# Patient Record
Sex: Male | Born: 1955 | Race: White | Hispanic: No | Marital: Married | Smoking: Former smoker
Health system: Southern US, Community
[De-identification: ages and names within clinical notes are randomized; demographics above are authoritative.]

## PROBLEM LIST (undated history)

## (undated) DIAGNOSIS — Z8711 Personal history of peptic ulcer disease: Secondary | ICD-10-CM

## (undated) DIAGNOSIS — I2510 Atherosclerotic heart disease of native coronary artery without angina pectoris: Secondary | ICD-10-CM

## (undated) DIAGNOSIS — N529 Male erectile dysfunction, unspecified: Secondary | ICD-10-CM

## (undated) DIAGNOSIS — Z8719 Personal history of other diseases of the digestive system: Secondary | ICD-10-CM

## (undated) DIAGNOSIS — N182 Chronic kidney disease, stage 2 (mild): Secondary | ICD-10-CM

## (undated) DIAGNOSIS — Z8669 Personal history of other diseases of the nervous system and sense organs: Secondary | ICD-10-CM

## (undated) DIAGNOSIS — K219 Gastro-esophageal reflux disease without esophagitis: Secondary | ICD-10-CM

## (undated) DIAGNOSIS — F32A Depression, unspecified: Secondary | ICD-10-CM

## (undated) DIAGNOSIS — I10 Essential (primary) hypertension: Secondary | ICD-10-CM

## (undated) DIAGNOSIS — M1990 Unspecified osteoarthritis, unspecified site: Secondary | ICD-10-CM

## (undated) DIAGNOSIS — G8928 Other chronic postprocedural pain: Secondary | ICD-10-CM

## (undated) DIAGNOSIS — I209 Angina pectoris, unspecified: Secondary | ICD-10-CM

## (undated) DIAGNOSIS — K589 Irritable bowel syndrome without diarrhea: Secondary | ICD-10-CM

## (undated) DIAGNOSIS — R011 Cardiac murmur, unspecified: Secondary | ICD-10-CM

## (undated) DIAGNOSIS — Q231 Congenital insufficiency of aortic valve: Secondary | ICD-10-CM

## (undated) DIAGNOSIS — C641 Malignant neoplasm of right kidney, except renal pelvis: Secondary | ICD-10-CM

## (undated) DIAGNOSIS — Q2381 Bicuspid aortic valve: Secondary | ICD-10-CM

## (undated) DIAGNOSIS — Z9289 Personal history of other medical treatment: Secondary | ICD-10-CM

## (undated) DIAGNOSIS — F329 Major depressive disorder, single episode, unspecified: Secondary | ICD-10-CM

## (undated) DIAGNOSIS — E785 Hyperlipidemia, unspecified: Secondary | ICD-10-CM

## (undated) DIAGNOSIS — F419 Anxiety disorder, unspecified: Secondary | ICD-10-CM

## (undated) DIAGNOSIS — G43909 Migraine, unspecified, not intractable, without status migrainosus: Secondary | ICD-10-CM

## (undated) HISTORY — DX: Anxiety disorder, unspecified: F41.9

## (undated) HISTORY — PX: CARDIAC VALVE REPLACEMENT: SHX585

## (undated) HISTORY — DX: Depression, unspecified: F32.A

## (undated) HISTORY — PX: TOE SURGERY: SHX1073

## (undated) HISTORY — DX: Personal history of other diseases of the nervous system and sense organs: Z86.69

## (undated) HISTORY — PX: EXCISIONAL HEMORRHOIDECTOMY: SHX1541

## (undated) HISTORY — DX: Male erectile dysfunction, unspecified: N52.9

## (undated) HISTORY — DX: Other chronic postprocedural pain: G89.28

## (undated) HISTORY — PX: ELBOW SURGERY: SHX618

## (undated) HISTORY — PX: TONSILLECTOMY: SUR1361

## (undated) HISTORY — PX: CARDIAC CATHETERIZATION: SHX172

## (undated) HISTORY — DX: Bicuspid aortic valve: Q23.81

## (undated) HISTORY — DX: Congenital insufficiency of aortic valve: Q23.1

## (undated) HISTORY — DX: Irritable bowel syndrome, unspecified: K58.9

## (undated) HISTORY — PX: BUNIONECTOMY: SHX129

## (undated) HISTORY — DX: Malignant neoplasm of right kidney, except renal pelvis: C64.1

## (undated) HISTORY — DX: Cardiac murmur, unspecified: R01.1

## (undated) HISTORY — DX: Major depressive disorder, single episode, unspecified: F32.9

## (undated) HISTORY — DX: Hyperlipidemia, unspecified: E78.5

## (undated) HISTORY — PX: HERNIA REPAIR: SHX51

---
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
## 2000-06-16 ENCOUNTER — Ambulatory Visit (HOSPITAL_BASED_OUTPATIENT_CLINIC_OR_DEPARTMENT_OTHER): Admission: RE | Admit: 2000-06-16 | Discharge: 2000-06-16 | Payer: Self-pay | Admitting: Orthopedic Surgery

## 2001-02-04 ENCOUNTER — Encounter: Admission: RE | Admit: 2001-02-04 | Discharge: 2001-02-04 | Payer: Self-pay | Admitting: Internal Medicine

## 2001-02-04 ENCOUNTER — Encounter: Payer: Self-pay | Admitting: Internal Medicine

## 2001-02-16 DIAGNOSIS — C641 Malignant neoplasm of right kidney, except renal pelvis: Secondary | ICD-10-CM

## 2001-02-16 HISTORY — DX: Malignant neoplasm of right kidney, except renal pelvis: C64.1

## 2001-11-18 ENCOUNTER — Encounter: Admission: RE | Admit: 2001-11-18 | Discharge: 2001-11-18 | Payer: Self-pay | Admitting: Internal Medicine

## 2001-11-18 ENCOUNTER — Encounter: Payer: Self-pay | Admitting: Internal Medicine

## 2002-05-10 ENCOUNTER — Encounter: Payer: Self-pay | Admitting: Internal Medicine

## 2002-05-10 ENCOUNTER — Encounter: Admission: RE | Admit: 2002-05-10 | Discharge: 2002-05-10 | Payer: Self-pay | Admitting: Internal Medicine

## 2002-07-07 ENCOUNTER — Ambulatory Visit (HOSPITAL_COMMUNITY): Admission: RE | Admit: 2002-07-07 | Discharge: 2002-07-07 | Payer: Self-pay | Admitting: Family Medicine

## 2002-07-18 ENCOUNTER — Encounter: Admission: RE | Admit: 2002-07-18 | Discharge: 2002-07-18 | Payer: Self-pay | Admitting: Family Medicine

## 2002-08-16 ENCOUNTER — Encounter: Payer: Self-pay | Admitting: Urology

## 2002-08-24 ENCOUNTER — Encounter (INDEPENDENT_AMBULATORY_CARE_PROVIDER_SITE_OTHER): Payer: Self-pay | Admitting: Specialist

## 2002-08-24 ENCOUNTER — Inpatient Hospital Stay (HOSPITAL_COMMUNITY): Admission: RE | Admit: 2002-08-24 | Discharge: 2002-08-27 | Payer: Self-pay | Admitting: Urology

## 2002-08-24 HISTORY — PX: PARTIAL NEPHRECTOMY: SHX414

## 2003-04-13 ENCOUNTER — Ambulatory Visit (HOSPITAL_COMMUNITY): Admission: RE | Admit: 2003-04-13 | Discharge: 2003-04-13 | Payer: Self-pay | Admitting: General Surgery

## 2003-04-13 HISTORY — PX: ANAL SPHINCTEROTOMY: SHX1140

## 2003-09-27 ENCOUNTER — Ambulatory Visit (HOSPITAL_COMMUNITY): Admission: RE | Admit: 2003-09-27 | Discharge: 2003-09-27 | Payer: Self-pay | Admitting: Cardiology

## 2003-09-27 ENCOUNTER — Encounter (INDEPENDENT_AMBULATORY_CARE_PROVIDER_SITE_OTHER): Payer: Self-pay | Admitting: Cardiology

## 2003-11-14 ENCOUNTER — Ambulatory Visit (HOSPITAL_COMMUNITY): Admission: RE | Admit: 2003-11-14 | Discharge: 2003-11-14 | Payer: Self-pay | Admitting: Urology

## 2003-11-29 ENCOUNTER — Ambulatory Visit (HOSPITAL_COMMUNITY): Admission: RE | Admit: 2003-11-29 | Discharge: 2003-11-30 | Payer: Self-pay | Admitting: Cardiology

## 2004-11-12 ENCOUNTER — Ambulatory Visit (HOSPITAL_COMMUNITY): Admission: RE | Admit: 2004-11-12 | Discharge: 2004-11-12 | Payer: Self-pay | Admitting: Urology

## 2006-02-16 HISTORY — PX: COLONOSCOPY: SHX174

## 2006-10-08 ENCOUNTER — Ambulatory Visit (HOSPITAL_COMMUNITY): Admission: RE | Admit: 2006-10-08 | Discharge: 2006-10-08 | Payer: Self-pay | Admitting: Urology

## 2007-07-29 ENCOUNTER — Ambulatory Visit (HOSPITAL_BASED_OUTPATIENT_CLINIC_OR_DEPARTMENT_OTHER): Admission: RE | Admit: 2007-07-29 | Discharge: 2007-07-29 | Payer: Self-pay | Admitting: Orthopedic Surgery

## 2007-07-29 HISTORY — PX: LATERAL EPICONDYLE RELEASE: SHX1958

## 2010-07-01 NOTE — Op Note (Signed)
NAME:  Max Byrd, Max Byrd                   ACCOUNT NO.:  [id number]   MEDICAL RECORD NO.:  [id number]          PATIENT TYPE:  AMB   LOCATION:  DSC                          FACILITY:  [facility]   PHYSICIAN:  Harvie Junior, M.D.   DATE OF BIRTH:  04-12-1955   DATE OF PROCEDURE:  07/29/2007  DATE OF DISCHARGE:                               OPERATIVE REPORT   PREOPERATIVE DIAGNOSES:  1. Lateral epicondylitis.  2. Posterior interosseous nerve compression.   POSTOPERATIVE DIAGNOSES:  1. Lateral epicondylitis.  2. Posterior interosseous nerve compression.   PRINCIPAL PROCEDURE:  1. Lateral epicondylar release with debridement of tendon stripping      and debridement of the bone.  2. Release of posterior interosseous nerve through the body of the      supinator muscle and the ACRB proximally.   SURGEON:  Harvie Junior, M.D.   ASSISTANT:  Marshia Ly, P.A.   ANESTHESIA:  General.   BRIEF HISTORY:  Mr. Heidinger is a 55 year old male with long history of  having had significant left elbow pain.  We treated conservatively for a  period of time with injection therapy, activity modification.  Because  of continued complaints of pain, he was ultimately taken to the  operating room for lateral epicondylar release and exploration of the  posterior interosseous nerve.   PROCEDURE:  The patient was taken to the operating room.  After adequate  anesthesia obtained with general anesthetic, the patient was placed  supine on the operating table.  The left arm was then prepped and draped  in usual sterile fashion.  Following this, the arm was exsanguinated and  blood pressure tourniquet was inflated to 250 mmHg.  Following this, an  incision was made over the dorsal aspect of the wrist.  Subcutaneous  tissue dissected down to the level of the fascia.  The fascia was then  divided.  The brachioradialis and extensor carpi radialis longus fascia  was divided over the elbow, and once the slit was made, joint  fluid  began coming into the wound as the extensor brevis had been completely  avulsed from this area.   This remaining fibers from the brevis were released from the lateral  epicondyle, and this torn portion of the common extensor origin was  debrided.  Once this was done, we were essentially looking in the elbow  joint, had a brief exploration of the elbow joint.  There was no  evidence of loose bodies or bad synovitis.  The joint was thoroughly  irrigated and suctioned dried.  At this point, a rongeur was taken to  roughen up the lateral epicondyle to a good bed of bleeding bone and  then the muscle brachioradialis and extensor carpi radialis longus were  then re-attached over the lateral epicondyle.  Once this was completed,  the area was closed with 0 Vicryl running.  Attention was then turned  distally between the interval between the mobile wad  and the extensor  digitorum communis.  We dissected down to this interval and found the  posterior interosseous nerve __________ I think,  because the brevis had  been pulled distally.   The brevis proximally had a significant impact on the posterior  interosseous nerve, this was released, and the arcade of Frohse was  released.  The entrance into the supinator was released, and the nerve  was released through the belly of the supinator muscle.  Once this was  completed, the wound was copiously and thoroughly irrigated and  suctioned dry.  The rent between the fascia over the mobile wad  and the  communis was closed with 1-0 Vicryl running.  This was a general layer,  not really strength layer but just to close the fascia.  The skin was  then closed with a combination of 4-0 Vicryl and 3-0 Monocryl  subcuticular stitching.  Benzoin and Steri-Strips were applied.  Sterile  compression dressing was applied.  The patient was taken to the recovery  room and was noted to be in satisfactory condition.   ESTIMATED BLOOD LOSS:  During the  procedure was none.      Harvie Junior, M.D.  Electronically Signed     JLG/MEDQ  D:  07/29/2007  T:  07/30/2007  Job:  [id number]

## 2010-07-04 NOTE — Cardiovascular Report (Signed)
NAME:  Max Byrd, Max Byrd                   ACCOUNT NO.:  [id number]   MEDICAL RECORD NO.:  [id number]          PATIENT TYPE:  OIB   LOCATION:  3735                         FACILITY:  [facility]   PHYSICIAN:  Armanda Magic, M.D.     DATE OF BIRTH:  [birth year]   DATE OF PROCEDURE:  11/29/2003  DATE OF DISCHARGE:                              CARDIAC CATHETERIZATION   PROCEDURE:  Coronary angiography and aortography.   OPERATOR:  Armanda Magic, M.D.   INDICATIONS FOR PROCEDURE:  Chest pain, abnormal Cardiolite.   COMPLICATIONS:  None.   IV ACCESS:  Via right femoral artery 6 French sheath.   This is a 55 year old white male with previously diagnosed aortic valve  sclerosis and bicuspid aortic valve by TEE done earlier this year.  Aortic  valve area 2.4 cm square by telemetry at the time of TEE.  The patient has  been having some intermittent rest chest pain and exertional shortness of  breath, and was noted to have mild inferior septal basal reversible defect  and now presents for cardiac catheterization.   The patient is brought to the cardiac catheterization laboratory in a  fasting, nonsedated state.  Informed consent was obtained.  The patient was  connected to continuous heart rate and pulse oximetry monitoring and  intermittent blood pressure monitoring.  The right groin was prepped and  draped in a sterile fashion.  1% Xylocaine was used for local anesthesia.  Using a modified Seldinger technique, a 6 French sheath was placed in the  right femoral artery.  Under fluoroscopic guidance, a 6 [place] JL4 catheter  was placed in the left coronary artery.  Multiple cine films were taken in  the 30 degree RAO and 40 degree LAO views.  This catheter was then exchanged  out over a guide-wire for 6 [place] JR4 catheter which was placed under  fluoroscopic guidance in the right coronary artery.  Multiple attempts at  engaging the right coronary ostium were unsuccessful and the catheter was  exchanged  out for a 6 [place] no-tail catheter, again, which was unsuccessful  in cannulating the right coronary ostium.  The catheter was exchanged out  for 6 French angled pigtail catheter which was placed under fluoroscopic  guidance into the area of the aortic valve.  The aortic valve was probed but  the catheter and the guide-wire were unsuccessful in crossing the aortic  valve.  The catheter was then pulled back into the proximal aortic root and  aortography was performed using a total of 20 mL of contrast at 15 mL per  second.  The catheter was then removed over a guide-wire.  At the end of the  procedure, all catheters and sheaths were removed.  Manual compression was  performed until adequate hemostasis was obtained.  The patient was  transferred back to her room in stable condition.   RESULTS:  Left main coronary artery is widely patent and bifurcates into a  left anterior descending  and left circumflex artery.  The left anterior  descending artery has a proximal 20-30% narrowing and then is widely  patent  throughout its course to the apex.  It gives rise to one diagonal branch  which is widely patent.   The left circumflex has a proximal 20-30% narrowing, as well, and then is  widely patent to the mid portion.  It gives rise to a very high first obtuse  marginal branch which immediately bifurcates into two branches and small and  widely patent.  The ongoing circumflex gives rise to a very large OM2 which  is widely patent throughout its course and bifurcates distally in the  posterior descending artery and posterolateral artery.  The ongoing  circumflex has a 70-80% narrowing before terminating distally.   The right coronary artery ostium could not be cannulated and there was no  evidence of take off of the right coronary artery by aortography, question  whether this is an anomalous take off but I could not locate the right  coronary artery on aortography.   Left ventriculography was  not performed because the catheter or wire was  unable to pass across the aortic valve.  The patient does have a history of  bicuspid aortic valve with no significant aortic valve stenosis by  telemetry.  The valve area was 2.4 cm square at the time of TEE.   ASSESSMENT:  1.  Bicuspid aortic valve by TEE with no significant stenosis at that time.  2.  Chest pain with positive Cardiolite for ischemia in the inferobasal      septum.  3.  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  4.  RCA not visualized, question of anomalous take off.   PLAN:  Review of films with Dr. Katrinka Blazing, probable PCI of the left circumflex  on October 14, will start on aspirin and Plavix.       TT/MEDQ  D:  11/29/2003  T:  11/29/2003  Job:  [id number]   cc:   Ike Bene, M.D.  [address]  [place]  [place] [address]  Fax: [phone]

## 2010-07-04 NOTE — H&P (Signed)
NAME:  Max Byrd, CHECK                             ACCOUNT NO.:  [id number]   MEDICAL RECORD NO.:  [id number]                   PATIENT TYPE:  INP   LOCATION:  0345                                 FACILITY:  [facility]   PHYSICIAN:  Valetta Fuller, M.D.               DATE OF BIRTH:  1955-08-20   DATE OF ADMISSION:  08/24/2002  DATE OF DISCHARGE:  08/27/2002                                HISTORY & PHYSICAL   CHIEF COMPLAINT:  Right renal mass.   HISTORY OF PRESENT ILLNESS:  Mr. Lammert is a 55 year old male.  The patient  was incidentally noted to have a small right renal mass.  This was noted  after the patient had MRI because of some nonspecific leg pain.  The patient  had a CT angiogram which revealed a 2 cm enhancing mass in the mid pole of  the right kidney.  We extensively discussed the situation with the patient  and felt that this was probably a small renal cell carcinoma.  The patient  was actually initially a patient of Claudette Laws, M.D. who does not do  partial nephrectomies and he was kind enough to refer him to our practice.  After much discussion the patient elected to proceed with flank exploration  and partial nephrectomy.  He is completely asymptomatic from this lesion.  The patient now presents for that procedure and hopefully routine  postoperative care status post the procedure.   PAST MEDICAL HISTORY:  Relatively unremarkable.  The patient really has no  significant systemic __________  other than some mild hypertension.   CURRENT MEDICATIONS:  1. Arthrotec.  2. Avapro.  3. Darvocet.   ALLERGIES:  He had no drug allergies.   SOCIAL HISTORY:  He does have approximately 30-40-pack-year smoking history  and is a social drinker.   FAMILY HISTORY:  Noncontributory.   PAST SURGICAL HISTORY:  Notable for some orthopedic procedures.   REVIEW OF SYSTEMS:  Only been notable for some right leg discomfort.   PHYSICAL EXAMINATION:  GENERAL:  He is a well-developed,  well-nourished  male.  VITAL SIGNS:  He was afebrile with a blood pressure 114/80, heart rate 72.  NECK:  No JVD.  CHEST:  Clear to auscultation.  ABDOMEN:  Soft, benign without obvious masses.  GENITOURINARY:  Normal.  EXTREMITIES:  Normal.   DATA:  Basic electrolyte and CBC panels were within normal limits.    ASSESSMENT:  Right renal mass.  The patient will undergo right flank  exploration with probable partial nephrectomy today and hopefully will be  admitted for routine postoperative care.                                               Valetta Fuller, M.D.  DSG/MEDQ  D:  09/21/2002  T:  09/21/2002  Job:  [id number]

## 2010-07-04 NOTE — Discharge Summary (Signed)
   NAME:  Max Byrd, Max Byrd                             ACCOUNT NO.:  [id number]   MEDICAL RECORD NO.:  [id number]                   PATIENT TYPE:  INP   LOCATION:  0345                                 FACILITY:  [facility]   PHYSICIAN:  Valetta Fuller, M.D.               DATE OF BIRTH:  31-Jan-1956   DATE OF ADMISSION:  08/24/2002  DATE OF DISCHARGE:  08/27/2002                                 DISCHARGE SUMMARY   DISCHARGE DIAGNOSES:  Renal cell carcinoma.   PROCEDURE PERFORMED:  Right partial nephrectomy on August 24, 2002.   HOSPITAL COURSE:  [name] is a 55 year old male.  He was noted to have  incidental enhancing 2 cm right renal mass.  The patient agreed with our  recommendations for partial nephrectomy given high likelihood that this  represented a renal cell carcinoma.  On August 24, 2002 the patient underwent  exploration.  The lesion itself was not exophytic and was deep within the  parenchyma.  The initial margins on the tumor were positive and therefore we  took additional tissue which was then negative.  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  Maximum tumor size was 2.2 cm.  Again, lateral and base margins were  negative.  Surgery itself was relatively uneventful.  Estimated blood loss  was approximately 1000 mL.  Postoperatively his hemoglobin was 13.  The  patient did have a Jackson-Pratt drain.  He remained stable throughout his  hospitalization and remained afebrile.  He had good p.o. intake and his JP  drainage decreased and was eventually removed.  The patient was discharged  home on postoperative day three.  At that time he was afebrile, ambulating,  with a general diet.   DISPOSITION:  The patient was discharged home with routine instructions.  He  was given a prescription for Tylox and will be seen in our office in five to  seven days.                                               Valetta Fuller, M.D.    DSG/MEDQ  D:  09/21/2002  T:  09/21/2002  Job:  [id number]

## 2010-07-04 NOTE — Op Note (Signed)
NAME:  Max Byrd, Max Byrd                             ACCOUNT NO.:  [id number]   MEDICAL RECORD NO.:  [id number]                   PATIENT TYPE:  AMB   LOCATION:  DAY                                  FACILITY:  [facility]   PHYSICIAN:  Sharlet Salina T. Hoxworth, M.D.          DATE OF BIRTH:  January 08, 1956   DATE OF PROCEDURE:  04/13/2003  DATE OF DISCHARGE:                                 OPERATIVE REPORT   PREOPERATIVE DIAGNOSES:  Chronic anal fissure.   POSTOPERATIVE DIAGNOSES:  Chronic anal fissure.   PROCEDURE:  Lateral internal anal sphincterotomy.   SURGEON:  Lorne Skeens. Hoxworth, M.D.   ANESTHESIA:  General endotracheal.   BRIEF HISTORY:  Max Byrd is a 55 year old white male who presents with a  several month history of persistent anal pain and bleeding with bowel  movements.  Examination in the office has revealed a typical appearing  posterior midline fissure.  After discussion of the options, we have elected  to proceed with internal anal sphincterotomy in an attempt to get this  healed. The nature of the procedure, indications, risks of bleeding,  infection, nonhealing and degrees of incontinence were discussed and  understood preoperatively.  He is now brought to the operating room for this  procedure.   DESCRIPTION OF PROCEDURE:  The patient was brought to the operating room,  placed in the supine position on the operating table.  General endotracheal  anesthesia was induced.  He received preoperative antibiotics.  He was  carefully positioned in lithotomy position, the perineum sterilely prepped  and draped.  The anus gently dilated and retractor placed.  Examination  revealed a typical appearing posterior midline fissure.  There were some  moderate internal and external hemorrhoids that were not inflamed.  The  internal anal sphincter felt hypertrophied.  The intersphincteric groove was  easily palpable in the left lateral anal canal and a small stab incision was  made here and the   tip of the hemostat inserted into the intersphincteric  groove. The hypertrophied sphincter was then elevated with the hemostat into  the incision and divided with cautery.  The perianal area was infiltrated  with Marcaine with epinephrine.  Hemostasis was obtained with pressure.  A  dry gauze dressing was applied, the patient taken to recovery in  satisfactory condition.                                               Lorne Skeens. Hoxworth, M.D.    Tory Emerald  D:  04/13/2003  T:  04/13/2003  Job:  [id number]

## 2010-07-04 NOTE — Op Note (Signed)
NAME:  Max Byrd, Max Byrd                             ACCOUNT NO.:  [id number]   MEDICAL RECORD NO.:  [id number]                   PATIENT TYPE:  INP   LOCATION:  0345                                 FACILITY:  [facility]   PHYSICIAN:  Valetta Fuller, M.D.               DATE OF BIRTH:  Dec 02, 1955   DATE OF PROCEDURE:  08/24/2002  DATE OF DISCHARGE:                                 OPERATIVE REPORT   PREOPERATIVE DIAGNOSIS:  Right renal mass.   POSTOPERATIVE DIAGNOSIS:  Right renal mass.   PROCEDURE PERFORMED:  Right partial nephrectomy.   SURGEON:  Valetta Fuller, M.D.   ASSISTANT:  Lindaann Slough, M.D.   ANESTHESIA:  General endotracheal.   INDICATIONS FOR PROCEDURE:  [name] is a 55 year old male. He was sent to  one of my partners, Dr. Etta Grandchild, because of an incidentally noted right renal  mass. The patient had been having some nonspecific leg pain. As part of  that, he had a CT angio. On the CT, a 2 cm enhancing mass was noted in the  mid pole of the right kidney. Dr. Etta Grandchild talked to the patient about this and  suggested that he may want to consider a partial nephrectomy. Subsequently,  [name] came to see me. He was told unequivocally that he had what appeared  to be an enhancing solid mass in the kidney and this was renal cell  carcinoma until proven otherwise. He was told that there was approximately a  75% chance that this was cancerous. We talked about the pros and cons of  biopsies but we discouraged that for several reasons as outlined in my  notes. We felt that this lesion was amendable to partial nephrectomy. The  only complicating factor was that it was about 95% within the parenchyma and  there really was no exophytic component unfortunately. We talked about the  pros and cons of partial nephrectomy with the normal contralateral renal  unit. We felt that given the size of the lesion, a partial nephrectomy was  at least a reasonable option. The patient really did not want  a radical  nephrectomy and asked [place] to please do everything we could to save that renal  unit.  He understood the advantages and disadvantages of partial nephrectomy  versus radical nephrectomy. He was told that partial nephrectomy can be more  complicated and is typically associated with more blood loss and does have  the potential for other complications not generally seen with radical  prostatectomy such as urinary leak, etc. Full informed consent was obtained.   TECHNIQUE AND FINDINGS:  The patient was brought to the operating room.  General endotracheal anesthesia was induced. A Foley catheter was placed and  then the patient was carefully placed in the standard flank position. Great  care was taken to pad his extremities and an axillary roll was utilized.  After the  patient was prepped and draped, a standard flank incision was made  off the tip of the twelfth rib. A small distal portion of the rib was taken.  The retroperitoneal space was opened and the kidney identified. The patient  had very little perinephric fat. We were able to establish a plane between  Gerota's fascia and the peritoneum and colon on the right side. The adrenal  gland was identified and we came across the top of the kidney to increase  mobility and allow [place] to bring the kidney down  more into the operative  field. We then turned our attention to the hilum. Two renal arteries were  identified which were encircled with Vesseloops. We then opened Gerota's  fascia on the area of the tumor. In order to identify the tumor, we had to  open Gerota's fascia right over the top of this tumor and there really was  very little if any fat really on the renal capsule. The tumor was able to be  identified but again there was only 1 or 2 mm of the tumor that was  exophytic and the 1 1/2 to 2 cm of the tumor were deep within the  parenchyma.   We elected to initiate the partial nephrectomy without vascular occlusion.  The  capsule of the kidney was scored around the lesion giving 4-5 mm of  parenchyma. The back of the knife was then used to dissect out the renal  mass. Once the lesion was removed, we took a portion from the back side of  the mass and sent it for frozen section as well as the mass. The mass itself  was a clear cell renal cell carcinoma. At the bottom edge, there was tumor  right up to the margin. For that reason, we took some additional margins of  tissue laterally in two places as well as an additional posterior margin  from the actual bed of the resection and those were negative. We felt that  we then had negative margins. By compressing the renal parenchyma, we were  able to control bleeding. There was a very small 3-4 mm opening in the  collecting system which was closed with interrupted Vicryl suture. The argon  beam coagulator was used. FloSeal and Surgicel were then placed within the  davit and compression was held for about five minutes. We then used some  additional hemostatic glue and placed another piece of Surgicel and again  pressure was held for additional five minutes. There really was excellent  hemostasis at the end. While obtaining the additional margins and while we  were coagulating this, we did have approximately 6-800 ml of blood loss. The  patient remained quite stable. Again at the completion of the procedure, we  could see no obvious oozing from the kidney. We did reapproximate Gerota's  fascia over the lesion. We elected to go ahead and place a Jackson-Pratt  drain to monitor bleeding as well as because of the very small opening  within the collecting system. The whole flank area was copiously irrigated.  The flank was then closed anatomically with three layers anteriorly and two  posteriorly with #1 PDS suture and clips were used on the skin. Again the patient appeared to tolerate the procedure well. There were no obvious  complications and he remained hemodynamically  stable. Sponge and needle  counts were correct and he was brought to the recovery room in stable  condition.  Valetta Fuller, M.D.    DSG/MEDQ  D:  08/24/2002  T:  08/24/2002  Job:  [id number]   cc:   Valetta Fuller, M.D.  [address]  [place]  [place] [address]  Fax: [phone]

## 2010-07-04 NOTE — Cardiovascular Report (Signed)
NAME:  Max Byrd, Max Byrd                   ACCOUNT NO.:  [id number]   MEDICAL RECORD NO.:  [id number]          PATIENT TYPE:  OIB   LOCATION:  3735                         FACILITY:  [facility]   PHYSICIAN:  Lyn Records III, M.D.DATE OF BIRTH:  1956/01/08   DATE OF PROCEDURE:  11/30/2003  DATE OF DISCHARGE:                              CARDIAC CATHETERIZATION   INDICATIONS:  The patient has an abnormal Cardiolite suggesting inferoseptal  ischemia, atypical chest pain, and underwent cardiac catheterization November 29, 2003 with finding of a questionable stenosis in the distal circumflex  and inability to cannulate the native right coronary.  This procedure has  been repeated to attempt to find the native right coronary artery and to  cross the aortic valve for hemodynamic recordings and to reevaluate the  distal circumflex.   PROCEDURE:  1.  Hemodynamic recordings in the left ventricle and aorta.  2.  Selective coronary angiography.  3.  Intracoronary nitroglycerin.  4.  Angiocele arteriotomy closure.   DESCRIPTION OF PROCEDURE:  After informed consent, a 6 French sheath was  placed in the right femoral artery and repeat coronary angiography was  performed with a #1 6 French left Amplatz catheter.  The native right  coronary was visually cannulated.  We then made additional angiographic  images of the left coronary using a #4 6 French Judkins catheter.  Hemodynamic recordings were made after crossing the aortic valve with the  Amplatz catheter and a pullback pressure was recorded.   After review of the cine angiograms and deciding not to further proceed with  any intervention on the distal circumflex due to vessel size, tortuosity,  and questionable significance of the lesion, angiocele arteriotomy closure  was performed.  The patient tolerated the procedure without complications.   HEMODYNAMIC DATA:  1.  Aortic pressure:  109/70.  2.  Left ventricular pressure: 127/23.  3.  Aortic  valve peak to peak gradient 18 mmHg.   LEFT VENTRICULOGRAPHY:  Not performed.   CORONARY ANGIOGRAPHY:  1.  Left main coronary artery:  Normal.  2.  Left anterior descending artery:  The LAD is a small somewhat diffuse      disease proximal vessel with up to 30-40% ostial and proximal narrowing.      The LAD reaches the LV apex but does not go to the inferoapical segment.      No high grade obstruction is present.  3.  Circumflex:  The circumflex is very dominant vessel given origin to at      least three obtuse marginal branches/the third marginal being a combined      third marginal/PDA.  The second obtuse marginal is a large quatificating      vessel that is free of any significant obstruction.  The first obtuse      marginal is very small and has Luminal irregularities.  The third obtuse      marginal/circumflex PDA contains proximal irregularities and in some      views appear to obstruct the vessel by up to 70% and other views appears  to represent only Luminal irregularities.  Quantification of stenosis in      this region is made difficult because of the presence of multiple small      crossing side branches.  4.  Right coronary artery:  The right coronary artery is small, nondominant,      rising from the anterior aspect of the anterior cusp/sinus of Valsalva      of the bicuspid valve.  No significant obstruction is noted.   CONCLUSIONS:  1.  There is a small peak to peak gradient across the bicuspid valve of 18      mmHg.  2.  The patient has mild to moderate coronary atherosclerosis with a distal      circumflex/posterior descending artery 50/70%.  The circumflex is      otherwise free of any significant obstruction and there are      irregularities in the ostial and proximal part of the left anterior      descending artery which is the small vessel.  The right coronary is      small and nondominant and free of obstruction.   PLAN:  Per Dr. Armanda Magic, PCI on the  distal circumflex/PDA was decided  against because the size of the vessel and inability to clearly document it.  There was significant focal stenosis.       HWS/MEDQ  D:  11/30/2003  T:  11/30/2003  Job:  [id number]   cc:   Ike Bene, M.D.  [address], [address]  [place]  [place] [address]  Fax: [phone]   Armanda Magic, M.D.  [address], [address]  [place], [place] [address]  Fax: [phone]

## 2010-07-04 NOTE — Op Note (Signed)
[facility]  Patient:    Max Byrd, Max Byrd                          MRN: [id number] Proc. Date: 06/16/00 Adm. Date:  [id number] Attending:  Milly Jakob                           Operative Report  PREOPERATIVE DIAGNOSIS:  Medial epicondylitis.  POSTOPERATIVE DIAGNOSIS:  Medial epicondylitis.  OPERATION PERFORMED:  Relief of medial epicondylar defect.  SURGEON:  Harvie Junior, M.D.  ASSISTANT: 1. Currie Paris. Thedore Mins. 2. Marchia Bond, PA student.  ANESTHESIA:  General.  INDICATIONS FOR PROCEDURE:  The patient is a 55 year old male with a long history of having medial epicondylar pain.  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  He has been injected x 3.  He has had no activity modification with continued complaints of pain.  Because of failure of conservative care he is ultimately brought to the operating room for medial epicondylar release.  DESCRIPTION OF PROCEDURE:  The patient was brought to the operating room and after adequate anesthesia was obtained with general anesthetic, the patient was placed supine on the operating table.  The left arm was then prepped and draped in the usual sterile fashion.  Following exsanguination of the limb with an Esmarch bandage, the blood pressure tourniquet was inflated to 250 mmHg. Following this, a curvilinear incision was made over the medial epicondylar muscle insertion.  The midline incision was made in the midbody of the flexor muscle mass.  The tendon was then divided in both directions. Gelatinous necrotic tissue was encountered just below the insertion.  This necrotic tissue was removed ____________   back to a good, normal-appearing tissue and then the bone was then rongeured back to a bleeding bed and the muscle tissue was then closed over the medial epicondyle.  At this point the wound was copiously irrigated and suctioned dry.  The skin was then closed with a combination of 3-0  Vicryl and 4-0 nylon interrupted sutures.  Sterile compressive dressing was applied as well as a posterior splint.  The patient was taken to the recovery room where he was noted to be in satisfactory condition.  Estimated blood loss for this procedure was none. DD:  06/16/00 TD:  06/16/00 Job: [id number]

## 2010-11-13 LAB — POCT I-STAT, CHEM 8
Creatinine, Ser: 1.2
Glucose, Bld: 96
HCT: 43
Hemoglobin: 14.6
Sodium: 140
TCO2: 27

## 2012-12-14 ENCOUNTER — Telehealth: Payer: Self-pay

## 2012-12-14 NOTE — Telephone Encounter (Signed)
Records Rec Via Fax For Referral From [facility] of pt gave to [facility] in Scheduling 12/14/12/Km

## 2012-12-15 ENCOUNTER — Other Ambulatory Visit: Payer: Self-pay | Admitting: Internal Medicine

## 2012-12-15 DIAGNOSIS — M5416 Radiculopathy, lumbar region: Secondary | ICD-10-CM

## 2012-12-20 ENCOUNTER — Other Ambulatory Visit (HOSPITAL_COMMUNITY): Payer: Self-pay | Admitting: Internal Medicine

## 2012-12-20 DIAGNOSIS — R600 Localized edema: Secondary | ICD-10-CM

## 2012-12-20 DIAGNOSIS — Q231 Congenital insufficiency of aortic valve: Secondary | ICD-10-CM

## 2012-12-22 ENCOUNTER — Ambulatory Visit (HOSPITAL_COMMUNITY): Payer: Federal, State, Local not specified - PPO | Attending: Internal Medicine | Admitting: Radiology

## 2012-12-22 DIAGNOSIS — R011 Cardiac murmur, unspecified: Secondary | ICD-10-CM | POA: Insufficient documentation

## 2012-12-22 DIAGNOSIS — R609 Edema, unspecified: Secondary | ICD-10-CM | POA: Insufficient documentation

## 2012-12-22 DIAGNOSIS — Q231 Congenital insufficiency of aortic valve: Secondary | ICD-10-CM | POA: Insufficient documentation

## 2012-12-22 DIAGNOSIS — I10 Essential (primary) hypertension: Secondary | ICD-10-CM | POA: Insufficient documentation

## 2012-12-22 DIAGNOSIS — I359 Nonrheumatic aortic valve disorder, unspecified: Secondary | ICD-10-CM

## 2012-12-22 DIAGNOSIS — R600 Localized edema: Secondary | ICD-10-CM

## 2012-12-22 NOTE — Progress Notes (Signed)
Echocardiogram performed.  

## 2012-12-28 ENCOUNTER — Ambulatory Visit
Admission: RE | Admit: 2012-12-28 | Discharge: 2012-12-28 | Disposition: A | Discharge status: Home / Self Care | Payer: Federal, State, Local not specified - PPO | Source: Ambulatory Visit | Attending: Internal Medicine | Admitting: Internal Medicine

## 2012-12-28 DIAGNOSIS — M5416 Radiculopathy, lumbar region: Secondary | ICD-10-CM

## 2013-01-19 ENCOUNTER — Encounter: Payer: Federal, State, Local not specified - PPO | Admitting: Cardiothoracic Surgery

## 2013-01-20 ENCOUNTER — Encounter: Payer: Self-pay | Admitting: *Deleted

## 2013-01-20 ENCOUNTER — Encounter: Payer: Self-pay | Admitting: Cardiothoracic Surgery

## 2013-01-20 ENCOUNTER — Institutional Professional Consult (permissible substitution) (INDEPENDENT_AMBULATORY_CARE_PROVIDER_SITE_OTHER): Payer: Federal, State, Local not specified - PPO | Admitting: Cardiothoracic Surgery

## 2013-01-20 VITALS — BP 169/105 | HR 70 | Resp 16 | Ht 66.0 in | Wt 170.0 lb

## 2013-01-20 DIAGNOSIS — I350 Nonrheumatic aortic (valve) stenosis: Secondary | ICD-10-CM

## 2013-01-20 DIAGNOSIS — I10 Essential (primary) hypertension: Secondary | ICD-10-CM | POA: Insufficient documentation

## 2013-01-20 DIAGNOSIS — K589 Irritable bowel syndrome without diarrhea: Secondary | ICD-10-CM | POA: Insufficient documentation

## 2013-01-20 DIAGNOSIS — Z8669 Personal history of other diseases of the nervous system and sense organs: Secondary | ICD-10-CM | POA: Insufficient documentation

## 2013-01-20 DIAGNOSIS — I25810 Atherosclerosis of coronary artery bypass graft(s) without angina pectoris: Secondary | ICD-10-CM | POA: Insufficient documentation

## 2013-01-20 DIAGNOSIS — G8928 Other chronic postprocedural pain: Secondary | ICD-10-CM | POA: Insufficient documentation

## 2013-01-20 DIAGNOSIS — N189 Chronic kidney disease, unspecified: Secondary | ICD-10-CM | POA: Insufficient documentation

## 2013-01-20 DIAGNOSIS — E785 Hyperlipidemia, unspecified: Secondary | ICD-10-CM | POA: Insufficient documentation

## 2013-01-20 DIAGNOSIS — I359 Nonrheumatic aortic valve disorder, unspecified: Secondary | ICD-10-CM

## 2013-01-20 DIAGNOSIS — N529 Male erectile dysfunction, unspecified: Secondary | ICD-10-CM | POA: Insufficient documentation

## 2013-01-20 DIAGNOSIS — F329 Major depressive disorder, single episode, unspecified: Secondary | ICD-10-CM | POA: Insufficient documentation

## 2013-01-20 DIAGNOSIS — Q231 Congenital insufficiency of aortic valve: Secondary | ICD-10-CM

## 2013-01-20 DIAGNOSIS — C801 Malignant (primary) neoplasm, unspecified: Secondary | ICD-10-CM

## 2013-01-20 DIAGNOSIS — F32A Depression, unspecified: Secondary | ICD-10-CM | POA: Insufficient documentation

## 2013-01-20 DIAGNOSIS — F419 Anxiety disorder, unspecified: Secondary | ICD-10-CM | POA: Insufficient documentation

## 2013-01-21 NOTE — Progress Notes (Signed)
PCP is Georgann Housekeeper, MD Referring Provider is Georgann Housekeeper, MD  Chief Complaint  Patient presents with  . Aortic Stenosis    severe due to bicuspid valve....ECHO...12/22/12...h/o CAD.Marland Kitchenlast heart cath in 2005 and TEE...has not seen his cardiologist pror to this visit.  He is under the care of a pain clinic for chronic pain from previous surgery to his left lower arm (see surgical history)   increasing fatigue, exertional chest discomfort, severe aortic stenosis by echocardiogram  HPI: 57 year old Caucasian male nonsmoker with hypertension and history of bicuspid aortic valve with recent echocardiogram demonstrating severe aortic stenosis with calculated valve area of 0.8. His primary physician ordered the echocardiogram due to the patient's complaint of increasing fatigue with normal daily activities and ankle edema. LV function is well preserved. No other valvular disease noted. Patient did have a cardiac catheterization several years ago showing minimal CAD treated medically.  The patient denies symptoms of presyncope or arrhythmia. He states he has had a murmur since childhood. The last reported echocardiogram several years ago which showed aortic sclerosis but only mild aortic stenosis.  Patient denies any active dental complaints and follows antibiotic prophylaxis for dental procedures.  Imaging studies show no evidence of a dilated ascending thoracic aorta  The patient's medical history significant for right partial nephrectomy for nephroma in 2005, severe C-spine arthritis with chronic neck and arm pain for which he is treated by a pain clinic, and history of GI bleeding with Advil or aspirin. 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 . Past Medical History  Diagnosis Date  . Erectile dysfunction     INJECTIONS OF PROSTAGLANDIN BY UROLGIST  . Chronic kidney disease     STAGE 2  . Cancer  2003    H/O RENAL R RENAL CELL CANCER  . H/O Bell's palsy 2010 X2  . Depression   . Anxiety   . Pain, chronic postoperative     S/P LEFT ARM SURGERY  . IBS (irritable bowel syndrome)   . Bicuspid aortic valve     AORTIC STENOSIS PER ECHO  . Heart murmur   . Hypertension   . Hyperlipidemia   . CAD (coronary artery disease)     Past Surgical History  Procedure Laterality Date  . Colonoscopy  2008    DIVERTICULOSIS  . Anal sphincterotomy  04/13/03    DR. GRAPEY  . Partial nephrectomy  08/24/02    RIGHT...DR. Isabel Caprice  . Lateral epicondyle release Left 07/29/07    DR. GRAVES    Family History  Problem Relation Age of Onset  . Cancer Mother     BREAST/OVARIAN  . Heart disease Father     S/P MI/CABG    Social History History  Substance Use Topics  . Smoking status: Former Smoker -- 1.50 packs/day for 5 years    Types: Cigarettes    Quit date: 01/21/1976  . Smokeless tobacco: Current User    Types: Snuff  . Alcohol Use: No     Comment: 4 BEERS PER WEEK UP UNTIL ABOUT A MONTH AGO    Current Outpatient Prescriptions  Medication Sig Dispense Refill  . ALPRAZolam (XANAX) 1 MG tablet Take 1 mg by mouth 4 (four) times daily.      . carisoprodol (SOMA) 350 MG tablet Take 350 mg by mouth 2 (two) times daily.      . Diclofenac-Misoprostol (ARTHROTEC) 50-0.2 MG TBEC Take 1 tablet by mouth 2 (  two) times daily.      . DULoxetine (CYMBALTA) 30 MG capsule Take 30 mg by mouth 2 (two) times daily.      Marland Kitchen escitalopram (LEXAPRO) 10 MG tablet Take 10 mg by mouth 2 (two) times daily.      . eszopiclone (LUNESTA) 1 MG TABS tablet Take 3 mg by mouth at bedtime. Take immediately before bedtime      . hyoscyamine (LEVBID) 0.375 MG 12 hr tablet Take 0.375 mg by mouth 2 (two) times daily as needed.      . irbesartan (AVAPRO) 150 MG tablet Take 150 mg by mouth daily.      . OxyCODONE HCl ER (OXYCONTIN) 30 MG T12A Take 1 tablet by mouth 3 (three) times daily.      . sildenafil (VIAGRA) 100 MG  tablet Take 100 mg by mouth daily as needed for erectile dysfunction.      . tapentadol (NUCYNTA) 50 MG TABS tablet Take by mouth every 6 (six) hours as needed. Takes 1 1/2 tabs= 7 A DAY       No current facility-administered medications for this visit.    Allergies  Allergen Reactions  . Aspirin Other (See Comments)    H/O BLEEDING ULCER  . Buspirone Other (See Comments)    MEMORY LOSS  . Klonopin [Clonazepam]     Erectile dysfunction  . Zoloft [Sertraline Hcl] Other (See Comments)    FELT TERRIBLE  . Reglan [Metoclopramide] Anxiety    EXCITABILITY     Review of Systems  Weight stable no recent fever or night sweats No history thoracic trauma or pneumothorax No history of rheumatic fever as a child He denies diabetes. He has multiple musculoskeletal complaints related to his cervical arthritis and previous bilateral arm and elbow operations. He has chronic edema of his right ankle from previous surgery related to trauma  BP 169/105  Pulse 70  Resp 16  Ht 5\' 6"  (1.676 m)  Wt 170 lb (77.111 kg)  BMI 27.45 kg/m2  SpO2 99% Physical Exam General appearance middle-aged Caucasian male no acute distress HEENT normocephalic with equal Neck without JVD mass or bruit Thorax without tenderness or deformity Cardiac regular rhythm with a grade 3/6 systolic ejection murmur Abdomen soft nontender without pulsatile mass Extremities with mild edema right greater than left Vascular palpable pulses Neuro no focal motor deficit  Diagnostic Tests: Echocardiogram images reviewed showing heavily calcified aortic valve significant, severe stenosis  Impression: Symptomatic severe aortic stenosis Patient needs right and left heart cardiac catheterization  Plan: We'll refer to Dr. Katrinka Blazing, [facility] cardiology for cardiac catheterization and review with patient. Patient interested in a tissue valve and does not want to be on long-term Coumadin.

## 2013-01-25 ENCOUNTER — Ambulatory Visit (INDEPENDENT_AMBULATORY_CARE_PROVIDER_SITE_OTHER): Payer: Federal, State, Local not specified - PPO | Admitting: Interventional Cardiology

## 2013-01-25 ENCOUNTER — Ambulatory Visit
Admission: RE | Admit: 2013-01-25 | Discharge: 2013-01-25 | Disposition: A | Discharge status: Home / Self Care | Payer: Federal, State, Local not specified - PPO | Source: Ambulatory Visit | Attending: Interventional Cardiology | Admitting: Interventional Cardiology

## 2013-01-25 ENCOUNTER — Encounter: Payer: Self-pay | Admitting: Interventional Cardiology

## 2013-01-25 VITALS — BP 146/88 | HR 59 | Ht 66.0 in | Wt 174.0 lb

## 2013-01-25 DIAGNOSIS — I10 Essential (primary) hypertension: Secondary | ICD-10-CM

## 2013-01-25 DIAGNOSIS — I350 Nonrheumatic aortic (valve) stenosis: Secondary | ICD-10-CM | POA: Insufficient documentation

## 2013-01-25 DIAGNOSIS — E785 Hyperlipidemia, unspecified: Secondary | ICD-10-CM

## 2013-01-25 DIAGNOSIS — Q231 Congenital insufficiency of aortic valve: Secondary | ICD-10-CM

## 2013-01-25 DIAGNOSIS — I2510 Atherosclerotic heart disease of native coronary artery without angina pectoris: Secondary | ICD-10-CM

## 2013-01-25 DIAGNOSIS — C801 Malignant (primary) neoplasm, unspecified: Secondary | ICD-10-CM

## 2013-01-25 DIAGNOSIS — I359 Nonrheumatic aortic valve disorder, unspecified: Secondary | ICD-10-CM

## 2013-01-25 DIAGNOSIS — Q2381 Bicuspid aortic valve: Secondary | ICD-10-CM

## 2013-01-25 DIAGNOSIS — N189 Chronic kidney disease, unspecified: Secondary | ICD-10-CM

## 2013-01-25 LAB — CBC WITH DIFFERENTIAL/PLATELET
Basophils Relative: 0.3 % (ref 0.0–3.0)
Eosinophils Relative: 5.5 % — ABNORMAL HIGH (ref 0.0–5.0)
HCT: 38.9 % — ABNORMAL LOW (ref 39.0–52.0)
Hemoglobin: 13.6 g/dL (ref 13.0–17.0)
Lymphocytes Relative: 23.3 % (ref 12.0–46.0)
Lymphs Abs: 1 10*3/uL (ref 0.7–4.0)
MCHC: 34.9 g/dL (ref 30.0–36.0)
MCV: 93.1 fl (ref 78.0–100.0)
Monocytes Absolute: 0.3 10*3/uL (ref 0.1–1.0)
Monocytes Relative: 8 % (ref 3.0–12.0)
Neutro Abs: 2.6 10*3/uL (ref 1.4–7.7)
RDW: 13.4 % (ref 11.5–14.6)
WBC: 4.1 10*3/uL — ABNORMAL LOW (ref 4.5–10.5)

## 2013-01-25 LAB — BASIC METABOLIC PANEL
BUN: 18 mg/dL (ref 6–23)
CO2: 29 mEq/L (ref 19–32)
Calcium: 8.9 mg/dL (ref 8.4–10.5)
Chloride: 104 mEq/L (ref 96–112)
Creatinine, Ser: 1.1 mg/dL (ref 0.4–1.5)
Glucose, Bld: 83 mg/dL (ref 70–99)
Sodium: 141 mEq/L (ref 135–145)

## 2013-01-25 LAB — PROTIME-INR
INR: 0.98 (ref <1.50)
Prothrombin Time: 13 seconds (ref 11.6–15.2)

## 2013-01-25 NOTE — Progress Notes (Signed)
Patient ID: Max Byrd, male   DOB: 04/14/1955, 57 y.o.   MRN: [id number]   Date: 01/25/2013 ID: Max Byrd, DOB 05/30/1955, MRN [id number] PCP: HUSAIN,KARRAR, MD  Reason: Aortic stenosis  ASSESSMENT;  1. 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 2. Chronic pain syndrome left arm 3. History of renal cell carcinoma  PLAN:  1. Left and right heart catheterization with coronary angiography. The procedure, risks of stroke, death, myocardial infarction, bleeding, allergy, limb ischemia, kidney failure, among others were discussed in detail with the patient and accepted.   SUBJECTIVE: Max Byrd is a 57 y.o. male who is referred by Dr. Van trigt for catheterization as a prelude to aortic valve replacement. The patient has a congenitally bicuspid aortic valve. Catheterization in 2005 demonstrated only mild aortic stenosis. A recent echocardiogram ordered by the patient's primary care physician, Dr. K. Husain demonstrated severe aortic stenosis. LV function was preserved. He saw Dr. Van Trigt in consultation and is referred for catheterization as a prelude to valve replacement. He has noted lower extremity swelling. There is exertional dyspnea and fatigue. He denies angina and syncope.   Allergies  Allergen Reactions  . Aspirin Other (See Comments)    H/O BLEEDING ULCER  . Buspirone Other (See Comments)    MEMORY LOSS  . Klonopin [Clonazepam]     Erectile dysfunction  . Zoloft [Sertraline Hcl] Other (See Comments)    FELT TERRIBLE  . Reglan [Metoclopramide] Anxiety    EXCITABILITY     Current Outpatient Prescriptions on File Prior to Visit  Medication Sig Dispense Refill  . ALPRAZolam (XANAX) 1 MG tablet Take 1 mg by mouth 4 (four) times daily.      . carisoprodol (SOMA) 350 MG tablet Take 350 mg by mouth 2 (two) times daily.      . Diclofenac-Misoprostol (ARTHROTEC) 50-0.2 MG TBEC Take 1 tablet by mouth 2 (two) times daily.      . DULoxetine (CYMBALTA) 30 MG  capsule Take 30 mg by mouth 2 (two) times daily.      . escitalopram (LEXAPRO) 10 MG tablet Take 10 mg by mouth 2 (two) times daily.      . eszopiclone (LUNESTA) 1 MG TABS tablet Take 3 mg by mouth at bedtime. Take immediately before bedtime      . hyoscyamine (LEVBID) 0.375 MG 12 hr tablet Take 0.375 mg by mouth 2 (two) times daily as needed.      . irbesartan (AVAPRO) 150 MG tablet Take 150 mg by mouth daily.      . OxyCODONE HCl ER (OXYCONTIN) 30 MG T12A Take 1 tablet by mouth 3 (three) times daily.      . sildenafil (VIAGRA) 100 MG tablet Take 100 mg by mouth daily as needed for erectile dysfunction.      . tapentadol (NUCYNTA) 50 MG TABS tablet Take by mouth every 6 (six) hours as needed. Takes 1 1/2 tabs= 7 A DAY       No current facility-administered medications on file prior to visit.    Past Medical History  Diagnosis Date  . Erectile dysfunction     INJECTIONS OF PROSTAGLANDIN BY UROLGIST  . Chronic kidney disease     STAGE 2  . Cancer 2003    H/O RENAL R RENAL CELL CANCER  . H/O Bell's palsy 2010 X2  . Depression   . Anxiety   . Pain, chronic postoperative     S/P LEFT ARM SURGERY  . IBS (irritable   bowel syndrome)   . Bicuspid aortic valve     AORTIC STENOSIS PER ECHO  . Heart murmur   . Hypertension   . Hyperlipidemia   . CAD (coronary artery disease)     Past Surgical History  Procedure Laterality Date  . Colonoscopy  2008    DIVERTICULOSIS  . Anal sphincterotomy  04/13/03    DR. GRAPEY  . Partial nephrectomy  08/24/02    RIGHT...DR. GRAPEY  . Lateral epicondyle release Left 07/29/07    DR. GRAVES    History   Social History  . Marital Status: Married    Spouse Name: N/A    Number of Children: N/A  . Years of Education: N/A   Occupational History  . Not on file.   Social History Main Topics  . Smoking status: Former Smoker -- 1.50 packs/day for 5 years    Types: Cigarettes    Quit date: 01/21/1976  . Smokeless tobacco: Current User    Types:  Snuff  . Alcohol Use: No     Comment: 4 BEERS PER WEEK UP UNTIL ABOUT A MONTH AGO  . Drug Use: Not on file  . Sexual Activity: Not on file   Other Topics Concern  . Not on file   Social History Narrative  . No narrative on file    Family History  Problem Relation Age of Onset  . Cancer Mother     BREAST/OVARIAN  . Heart disease Father     S/P MI/CABG    ROS: Denies syncope. No palpitations. No chills or fever. History of renal cell carcinoma but no evidence of recurrence since resection in 2003. Appetite is good. Chronic pain syndrome related to left arm. He has bilateral cervical Crush syndrome.. Other systems negative for complaints.  OBJECTIVE: Ht 5' 6" (1.676 m)  Wt 174 lb (78.926 kg)  BMI 28.10 kg/m2,  General: No acute distress, older than stated age. Stands with a cervical and thoracic lordosis. HEENT: normal . Neck: JVD flat. Carotids diminished amplitude Chest: Clear Cardiac: Murmur: 4/6 crescendo decrescendo murmur of aortic stenosis. No diastolic murmurs heard.. Gallop: S4 gallop. Rhythm: Regular. Other: Normal Abdomen: Bruit: Absent. Pulsation: Absent  Extremities:Diminished pulses in the lower extremities but present.  Edema: Absent.  Neuro: No focal deficit Psych: Anxious  ECG: Normal sinus rhythm with nonspecific T wave flattening and nonspecific QRS widening with left atrial abnormality.  

## 2013-01-25 NOTE — Patient Instructions (Signed)
Your physician has requested that you have a cardiac catheterization. Cardiac catheterization is used to diagnose and/or treat various heart conditions. Doctors may recommend this procedure for a number of different reasons. The most common reason is to evaluate chest pain. Chest pain can be a symptom of coronary artery disease (CAD), and cardiac catheterization can show whether plaque is narrowing or blocking your heart's arteries. This procedure is also used to evaluate the valves, as well as measure the blood flow and oxygen levels in different parts of your heart. For further information please visit [email or web address] Please follow instruction sheet, as given. (Scheduled for 01/27/13 @7 :30am)  Labs today: Bmet, Cbc, Pt/INR  A chest x-ray takes a picture of the organs and structures inside the chest, including the heart, lungs, and blood vessels. This test can show several things, including, whether the heart is enlarges; whether fluid is building up in the lungs; and whether pacemaker / defibrillator leads are still in place.( To be done at [facility] you do not need an appt)

## 2013-01-26 ENCOUNTER — Other Ambulatory Visit: Payer: Self-pay | Admitting: Interventional Cardiology

## 2013-01-26 ENCOUNTER — Encounter (HOSPITAL_COMMUNITY): Payer: Self-pay | Admitting: Pharmacy Technician

## 2013-01-26 DIAGNOSIS — I359 Nonrheumatic aortic valve disorder, unspecified: Secondary | ICD-10-CM

## 2013-01-27 ENCOUNTER — Encounter (HOSPITAL_COMMUNITY)
Admission: RE | Disposition: A | Discharge status: Home / Self Care | Payer: Self-pay | Source: Ambulatory Visit | Attending: Interventional Cardiology

## 2013-01-27 ENCOUNTER — Ambulatory Visit (HOSPITAL_COMMUNITY)
Admission: RE | Admit: 2013-01-27 | Discharge: 2013-01-27 | Disposition: A | Discharge status: Home / Self Care | Payer: Federal, State, Local not specified - PPO | Source: Ambulatory Visit | Attending: Interventional Cardiology | Admitting: Interventional Cardiology

## 2013-01-27 DIAGNOSIS — Z87891 Personal history of nicotine dependence: Secondary | ICD-10-CM | POA: Insufficient documentation

## 2013-01-27 DIAGNOSIS — I2584 Coronary atherosclerosis due to calcified coronary lesion: Secondary | ICD-10-CM | POA: Insufficient documentation

## 2013-01-27 DIAGNOSIS — F329 Major depressive disorder, single episode, unspecified: Secondary | ICD-10-CM | POA: Insufficient documentation

## 2013-01-27 DIAGNOSIS — Q231 Congenital insufficiency of aortic valve: Secondary | ICD-10-CM | POA: Insufficient documentation

## 2013-01-27 DIAGNOSIS — Z79899 Other long term (current) drug therapy: Secondary | ICD-10-CM | POA: Insufficient documentation

## 2013-01-27 DIAGNOSIS — N182 Chronic kidney disease, stage 2 (mild): Secondary | ICD-10-CM | POA: Insufficient documentation

## 2013-01-27 DIAGNOSIS — E785 Hyperlipidemia, unspecified: Secondary | ICD-10-CM | POA: Insufficient documentation

## 2013-01-27 DIAGNOSIS — I359 Nonrheumatic aortic valve disorder, unspecified: Secondary | ICD-10-CM

## 2013-01-27 DIAGNOSIS — G894 Chronic pain syndrome: Secondary | ICD-10-CM | POA: Insufficient documentation

## 2013-01-27 DIAGNOSIS — Z85528 Personal history of other malignant neoplasm of kidney: Secondary | ICD-10-CM | POA: Insufficient documentation

## 2013-01-27 DIAGNOSIS — F411 Generalized anxiety disorder: Secondary | ICD-10-CM | POA: Insufficient documentation

## 2013-01-27 DIAGNOSIS — F3289 Other specified depressive episodes: Secondary | ICD-10-CM | POA: Insufficient documentation

## 2013-01-27 DIAGNOSIS — I129 Hypertensive chronic kidney disease with stage 1 through stage 4 chronic kidney disease, or unspecified chronic kidney disease: Secondary | ICD-10-CM | POA: Insufficient documentation

## 2013-01-27 DIAGNOSIS — I2510 Atherosclerotic heart disease of native coronary artery without angina pectoris: Secondary | ICD-10-CM | POA: Insufficient documentation

## 2013-01-27 DIAGNOSIS — K589 Irritable bowel syndrome without diarrhea: Secondary | ICD-10-CM | POA: Insufficient documentation

## 2013-01-27 HISTORY — PX: LEFT AND RIGHT HEART CATHETERIZATION WITH CORONARY ANGIOGRAM: SHX5449

## 2013-01-27 LAB — POCT I-STAT 3, ART BLOOD GAS (G3+)
Acid-Base Excess: 1 mmol/L (ref 0.0–2.0)
Acid-base deficit: 1 mmol/L (ref 0.0–2.0)
Bicarbonate: 26.7 mEq/L — ABNORMAL HIGH (ref 20.0–24.0)
Bicarbonate: 28 mEq/L — ABNORMAL HIGH (ref 20.0–24.0)
O2 Saturation: 91 %
TCO2: 28 mmol/L (ref 0–100)
TCO2: 30 mmol/L (ref 0–100)
pO2, Arterial: 61 mmHg — ABNORMAL LOW (ref 80.0–100.0)
pO2, Arterial: 68 mmHg — ABNORMAL LOW (ref 80.0–100.0)

## 2013-01-27 LAB — POCT I-STAT 3, VENOUS BLOOD GAS (G3P V)
Bicarbonate: 27.6 mEq/L — ABNORMAL HIGH (ref 20.0–24.0)
O2 Saturation: 51 %
O2 Saturation: 62 %
TCO2: 30 mmol/L (ref 0–100)
pCO2, Ven: 58.1 mmHg — ABNORMAL HIGH (ref 45.0–50.0)
pCO2, Ven: 58.8 mmHg — ABNORMAL HIGH (ref 45.0–50.0)
pH, Ven: 7.28 (ref 7.250–7.300)
pH, Ven: 7.295 (ref 7.250–7.300)
pO2, Ven: 32 mmHg (ref 30.0–45.0)
pO2, Ven: 36 mmHg (ref 30.0–45.0)

## 2013-01-27 SURGERY — LEFT AND RIGHT HEART CATHETERIZATION WITH CORONARY ANGIOGRAM
Anesthesia: LOCAL

## 2013-01-27 MED ORDER — ASPIRIN 81 MG PO CHEW: 81.0000 mg | CHEWABLE_TABLET | ORAL | Status: DC

## 2013-01-27 MED ORDER — SODIUM CHLORIDE 0.9 % IJ SOLN: 3.0000 mL | INTRAMUSCULAR | Status: DC | PRN

## 2013-01-27 MED ORDER — DIAZEPAM 5 MG PO TABS
ORAL_TABLET | ORAL | Status: AC
  Filled 2013-01-27: qty 1

## 2013-01-27 MED ORDER — ONDANSETRON HCL 4 MG/2ML IJ SOLN: 4.0000 mg | Freq: Four times a day (QID) | INTRAMUSCULAR | Status: DC | PRN

## 2013-01-27 MED ORDER — SODIUM CHLORIDE 0.9 % IV SOLN
INTRAVENOUS | Status: DC
  Administered 2013-01-27: 07:00:00 via INTRAVENOUS

## 2013-01-27 MED ORDER — DIAZEPAM 5 MG PO TABS
5.0000 mg | ORAL_TABLET | ORAL | Status: AC
  Administered 2013-01-27: 5 mg via ORAL

## 2013-01-27 MED ORDER — SODIUM CHLORIDE 0.9 % IJ SOLN: 3.0000 mL | Freq: Two times a day (BID) | INTRAMUSCULAR | Status: DC

## 2013-01-27 MED ORDER — HEPARIN (PORCINE) IN NACL 2-0.9 UNIT/ML-% IJ SOLN
INTRAMUSCULAR | Status: AC
  Filled 2013-01-27: qty 1000

## 2013-01-27 MED ORDER — MIDAZOLAM HCL 2 MG/2ML IJ SOLN
INTRAMUSCULAR | Status: AC
  Filled 2013-01-27: qty 2

## 2013-01-27 MED ORDER — FENTANYL CITRATE 0.05 MG/ML IJ SOLN
INTRAMUSCULAR | Status: AC
  Filled 2013-01-27: qty 2

## 2013-01-27 MED ORDER — NITROGLYCERIN 0.2 MG/ML ON CALL CATH LAB
INTRAVENOUS | Status: AC
  Filled 2013-01-27: qty 1

## 2013-01-27 MED ORDER — SODIUM CHLORIDE 0.9 % IV SOLN: INTRAVENOUS | Status: AC

## 2013-01-27 MED ORDER — HEPARIN SODIUM (PORCINE) 1000 UNIT/ML IJ SOLN
INTRAMUSCULAR | Status: AC
  Filled 2013-01-27: qty 1

## 2013-01-27 MED ORDER — ACETAMINOPHEN 325 MG PO TABS: 650.0000 mg | ORAL_TABLET | ORAL | Status: DC | PRN

## 2013-01-27 MED ORDER — LIDOCAINE HCL (PF) 1 % IJ SOLN
INTRAMUSCULAR | Status: AC
  Filled 2013-01-27: qty 30

## 2013-01-27 MED ORDER — SODIUM CHLORIDE 0.9 % IV SOLN: 250.0000 mL | INTRAVENOUS | Status: DC | PRN

## 2013-01-27 NOTE — CV Procedure (Signed)
     Left and Right Heart Catheterization with Coronary Angiography  Report  Max Byrd  57 y.o.  male Jun 24, 1955  Procedure Date: 01/27/2013  Referring Physician: Kerin Perna, M.D.; Dr. Arther Dames Primary Cardiologist:: Gwynneth Albright, M.D.  INDICATIONS: Severe aortic stenosis, symptomatic. The study is being done to rule out significant concomitant coronary artery disease  PROCEDURE: 1. Left heart cath; 2. Right heart cath; 3. Coronary angiography  CONSENT:  The risks, benefits, and details of the procedure were explained in detail to the patient. Risks including death, stroke, heart attack, kidney injury, allergy, limb ischemia, bleeding and radiation injury were discussed.  The patient verbalized understanding and wanted to proceed.  Informed written consent was obtained.  PROCEDURE TECHNIQUE:  After Xylocaine anesthesia a 5 French sheath sheath was placed in the right femoral artery and a 7 French sheath in the right femoral vein with a single anterior needle wall sticks.  Coronary angiography was done using a an A2 multipurpose (5 [place]) an Amplatz left 1 cm catheters.  Left ventriculography was not done and only brief attempts to cross the valve were made.  He experienced no complications. Right heart catheterization was associated with resolution and Fick output determinations.  MEDICATIONS: 3 mg of Versed and 150 mcg of fentanyl   CONTRAST:  Total of 60 cc.  COMPLICATIONS:  none   HEMODYNAMICS:  Aortic pressure 125/77 mmHg; LV pressure not recorded; LVEDP not recorded, RA 6 mmHg, RV 35/10 mmHg, PA 34/13 mmHg, PCWP(mean)11 mmHg, Cardiac Output: 4.69 L per minute (Fick) and 3.32 L per minute (thermal dilution), AV gradient not recorded  ANGIOGRAPHIC DATA:   The left main coronary artery is heavily calcified. No significant obstruction.  The left anterior descending artery is heavily calcified. Severe tandem mid stenoses with a large diagonal arising between the 2  regions of most severe stenosis.there is also significant disease. The LAD distal to the diagonal is relatively small in diameter and distribution.  The left circumflex artery is dominant. It is heavily calcified. The first obtuse marginal is the dominant vessel. 2 distal small obtuse marginals also noted. There is moderate to severe diffuse first obtuse marginal disease predominantly in the proximal third. I do not see a hemodynamically significant lesion. The distal circumflex before the margin of the 2 branches contains moderate calcification with borderline significant obstruction..  The right coronary artery is severely diseased proximally and in the mid vessel. This followed by a small right ventricular branch. The vessel is nondominant.Marland Kitchen  LEFT VENTRICULOGRAM:  Left ventricular angiogram was not done.  IMPRESSIONS:  1. Known severe aortic stenosis 2. Severe coronary artery disease involving the LAD and native right coronary (nondominant). The circumflex contains intermediate stenosis 3. Normal pulmonary pressures 4. Heavy coronary calcification    RECOMMENDATION:  The patient will return to Dr. Donata Clay for surgical intervention.

## 2013-01-27 NOTE — Interval H&P Note (Signed)
Cath Lab Visit (complete for each Cath Lab visit)  Clinical Evaluation Leading to the Procedure:   ACS: no  Non-ACS:    Anginal Classification: No Symptoms  Anti-ischemic medical therapy: No Therapy  Non-Invasive Test Results: No non-invasive testing performed  Prior CABG: No previous CABG      History and Physical Interval Note:  01/27/2013 7:47 AM  Max Byrd  has presented today for surgery, with the diagnosis of Chest pain  The various methods of treatment have been discussed with the patient and family. After consideration of risks, benefits and other options for treatment, the patient has consented to  Procedure(s): LEFT AND RIGHT HEART CATHETERIZATION WITH CORONARY ANGIOGRAM (N/A) as a surgical intervention .  The patient's history has been reviewed, patient examined, no change in status, stable for surgery.  I have reviewed the patient's chart and labs.  Questions were answered to the patient's satisfaction.     Lesleigh Noe

## 2013-01-27 NOTE — H&P (View-Only) (Signed)
Patient ID: Max Byrd, male   DOB: Jan 08, 1956, 57 y.o.   MRN: [id number]   Date: 01/25/2013 ID: Max Byrd, DOB 1955/07/08, MRN [id number] PCP: Georgann Housekeeper, MD  Reason: Aortic stenosis  ASSESSMENT;  1. 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 2. Chronic pain syndrome left arm 3. History of renal cell carcinoma  PLAN:  1. Left and right heart catheterization with coronary angiography. The procedure, risks of stroke, death, myocardial infarction, bleeding, allergy, limb ischemia, kidney failure, among others were discussed in detail with the patient and accepted.   SUBJECTIVE: Max Byrd is a 57 y.o. male who is referred by Dr. Zenaida Niece trigt for catheterization as a prelude to aortic valve replacement. The patient has a congenitally bicuspid aortic valve. Catheterization in 2005 demonstrated only mild aortic stenosis. A recent echocardiogram ordered by the patient's primary care physician, Dr. Arther Dames demonstrated severe aortic stenosis. LV function was preserved. He saw Dr. Donata Clay in consultation and is referred for catheterization as a prelude to valve replacement. He has noted lower extremity swelling. There is exertional dyspnea and fatigue. He denies angina and syncope.   Allergies  Allergen Reactions  . Aspirin Other (See Comments)    H/O BLEEDING ULCER  . Buspirone Other (See Comments)    MEMORY LOSS  . Klonopin [Clonazepam]     Erectile dysfunction  . Zoloft [Sertraline Hcl] Other (See Comments)    FELT TERRIBLE  . Reglan [Metoclopramide] Anxiety    EXCITABILITY     Current Outpatient Prescriptions on File Prior to Visit  Medication Sig Dispense Refill  . ALPRAZolam (XANAX) 1 MG tablet Take 1 mg by mouth 4 (four) times daily.      . carisoprodol (SOMA) 350 MG tablet Take 350 mg by mouth 2 (two) times daily.      . Diclofenac-Misoprostol (ARTHROTEC) 50-0.2 MG TBEC Take 1 tablet by mouth 2 (two) times daily.      . DULoxetine (CYMBALTA) 30 MG  capsule Take 30 mg by mouth 2 (two) times daily.      Marland Kitchen escitalopram (LEXAPRO) 10 MG tablet Take 10 mg by mouth 2 (two) times daily.      . eszopiclone (LUNESTA) 1 MG TABS tablet Take 3 mg by mouth at bedtime. Take immediately before bedtime      . hyoscyamine (LEVBID) 0.375 MG 12 hr tablet Take 0.375 mg by mouth 2 (two) times daily as needed.      . irbesartan (AVAPRO) 150 MG tablet Take 150 mg by mouth daily.      . OxyCODONE HCl ER (OXYCONTIN) 30 MG T12A Take 1 tablet by mouth 3 (three) times daily.      . sildenafil (VIAGRA) 100 MG tablet Take 100 mg by mouth daily as needed for erectile dysfunction.      . tapentadol (NUCYNTA) 50 MG TABS tablet Take by mouth every 6 (six) hours as needed. Takes 1 1/2 tabs= 7 A DAY       No current facility-administered medications on file prior to visit.    Past Medical History  Diagnosis Date  . Erectile dysfunction     INJECTIONS OF PROSTAGLANDIN BY UROLGIST  . Chronic kidney disease     STAGE 2  . Cancer 2003    H/O RENAL R RENAL CELL CANCER  . H/O Bell's palsy 2010 X2  . Depression   . Anxiety   . Pain, chronic postoperative     S/P LEFT ARM SURGERY  . IBS (irritable  bowel syndrome)   . Bicuspid aortic valve     AORTIC STENOSIS PER ECHO  . Heart murmur   . Hypertension   . Hyperlipidemia   . CAD (coronary artery disease)     Past Surgical History  Procedure Laterality Date  . Colonoscopy  2008    DIVERTICULOSIS  . Anal sphincterotomy  04/13/03    DR. GRAPEY  . Partial nephrectomy  08/24/02    RIGHT...DR. Isabel Caprice  . Lateral epicondyle release Left 07/29/07    DR. GRAVES    History   Social History  . Marital Status: Married    Spouse Name: N/A    Number of Children: N/A  . Years of Education: N/A   Occupational History  . Not on file.   Social History Main Topics  . Smoking status: Former Smoker -- 1.50 packs/day for 5 years    Types: Cigarettes    Quit date: 01/21/1976  . Smokeless tobacco: Current User    Types:  Snuff  . Alcohol Use: No     Comment: 4 BEERS PER WEEK UP UNTIL ABOUT A MONTH AGO  . Drug Use: Not on file  . Sexual Activity: Not on file   Other Topics Concern  . Not on file   Social History Narrative  . No narrative on file    Family History  Problem Relation Age of Onset  . Cancer Mother     BREAST/OVARIAN  . Heart disease Father     S/P MI/CABG    ROS: Denies syncope. No palpitations. No chills or fever. History of renal cell carcinoma but no evidence of recurrence since resection in 2003. Appetite is good. Chronic pain syndrome related to left arm. He has bilateral cervical Crush syndrome.. Other systems negative for complaints.  OBJECTIVE: Ht 5\' 6"  (1.676 m)  Wt 174 lb (78.926 kg)  BMI 28.10 kg/m2,  General: No acute distress, older than stated age. Stands with a cervical and thoracic lordosis. HEENT: normal . Neck: JVD flat. Carotids diminished amplitude Chest: Clear Cardiac: Murmur: 4/6 crescendo decrescendo murmur of aortic stenosis. No diastolic murmurs heard.. Gallop: S4 gallop. Rhythm: Regular. Other: Normal Abdomen: Bruit: Absent. Pulsation: Absent  Extremities:Diminished pulses in the lower extremities but present.  Edema: Absent.  Neuro: No focal deficit Psych: Anxious  ECG: Normal sinus rhythm with nonspecific T wave flattening and nonspecific QRS widening with left atrial abnormality.

## 2013-01-30 ENCOUNTER — Ambulatory Visit: Payer: Federal, State, Local not specified - PPO | Admitting: Cardiology

## 2013-01-31 ENCOUNTER — Telehealth: Payer: Self-pay | Admitting: Interventional Cardiology

## 2013-01-31 NOTE — Telephone Encounter (Signed)
New message  Patient had a heart cath this past Friday, He would like to know the next step in his healthcare. He can't remember, but thinks Dr. Katrinka Blazing talked to him about about having by-pass surgery. Please call patient after 3pm and advise. If before 3pm he can be reached at this listed work number.

## 2013-01-31 NOTE — Telephone Encounter (Signed)
returned pt call.pt adv that Dr.vantright office will f/u with pt to sch bypass and aortic valve surgery.pt verbalized understanding.

## 2013-02-01 ENCOUNTER — Telehealth: Payer: Self-pay

## 2013-02-01 NOTE — Telephone Encounter (Signed)
called  Dr. Donata Clay office and spoke with Drinda Butts she will f/u with pt and call pt to schedule appt/surgery.

## 2013-02-03 ENCOUNTER — Encounter: Payer: Self-pay | Admitting: Cardiothoracic Surgery

## 2013-02-03 ENCOUNTER — Ambulatory Visit (INDEPENDENT_AMBULATORY_CARE_PROVIDER_SITE_OTHER): Payer: Federal, State, Local not specified - PPO | Admitting: Cardiothoracic Surgery

## 2013-02-03 ENCOUNTER — Other Ambulatory Visit: Payer: Self-pay | Admitting: *Deleted

## 2013-02-03 ENCOUNTER — Ambulatory Visit: Payer: Federal, State, Local not specified - PPO | Admitting: Cardiothoracic Surgery

## 2013-02-03 VITALS — BP 139/86 | HR 59 | Resp 16 | Ht 66.0 in | Wt 170.0 lb

## 2013-02-03 DIAGNOSIS — Q231 Congenital insufficiency of aortic valve: Secondary | ICD-10-CM

## 2013-02-03 DIAGNOSIS — I359 Nonrheumatic aortic valve disorder, unspecified: Secondary | ICD-10-CM

## 2013-02-03 DIAGNOSIS — I350 Nonrheumatic aortic (valve) stenosis: Secondary | ICD-10-CM

## 2013-02-03 DIAGNOSIS — I2510 Atherosclerotic heart disease of native coronary artery without angina pectoris: Secondary | ICD-10-CM

## 2013-02-03 NOTE — Progress Notes (Signed)
PCP is Georgann Housekeeper, MD Referring Provider is Georgann Housekeeper, MD  Chief Complaint  Patient presents with  . Aortic Stenosis    further discuss surgery and cardiac cath results    HPI: Patient returns for interval followup after initial consultation for severe aortic stenosis. The patient underwent left and right heart catheterization by Dr. Verdis Prime. The patient has significant multivessel disease with significant stenosis of the LAD-diagonal, the proximal OM and moderate disease of a small nondominant RCA. Right heart pressures were normal a cardiac index was greater than 2.2.  The patient is prepared to proceed with combined aVR CABG. We'll use a biologic valve because of the patient's previous history of GI bleeding and his personal preference not to be on Coumadin. I discussed the procedure in detail with the patient and his wife including indications, benefits, and expected postoperative recovery. I reviewed in details the risks to him of this operation including a less than 5% chance of stroke, major bleeding with transfusion, MI, wound infection, or death. He also understands the potential need for a postoperative pacemaker and issues with pain control since he has a long history of chronic pain syndrome followed by a pain specialist. After discussion of these issues he demonstrates his understanding and agrees to proceed with surgery under informed consent. The patient understands that he should communicate his chronic pain syndrome and chronic pain medications to his anesthesia team during his preoperative evaluation.  He will obtain his preoperative labs and studies on December 26 and surgery scheduled for December 30 at [facility] hospital Because the patient has a bicuspid valve he will undergo CT of the chest to assess his ascending aorta. We will not use contrast because of his previous partial nephrectomy.  Past Medical History  Diagnosis Date  . Erectile dysfunction     INJECTIONS OF  PROSTAGLANDIN BY UROLGIST  . Chronic kidney disease     STAGE 2  . Cancer 2003    H/O RENAL R RENAL CELL CANCER  . H/O Bell's palsy 2010 X2  . Depression   . Anxiety   . Pain, chronic postoperative     S/P LEFT ARM SURGERY  . IBS (irritable bowel syndrome)   . Bicuspid aortic valve     AORTIC STENOSIS PER ECHO  . Heart murmur   . Hypertension   . Hyperlipidemia   . CAD (coronary artery disease)     Past Surgical History  Procedure Laterality Date  . Colonoscopy  2008    DIVERTICULOSIS  . Anal sphincterotomy  04/13/03    DR. GRAPEY  . Partial nephrectomy  08/24/02    RIGHT...DR. Isabel Caprice  . Lateral epicondyle release Left 07/29/07    DR. GRAVES    Family History  Problem Relation Age of Onset  . Cancer Mother     BREAST/OVARIAN  . Heart disease Father     S/P MI/CABG    Social History History  Substance Use Topics  . Smoking status: Former Smoker -- 1.50 packs/day for 5 years    Types: Cigarettes    Quit date: 01/21/1976  . Smokeless tobacco: Current User    Types: Snuff  . Alcohol Use: No     Comment: 4 BEERS PER WEEK UP UNTIL ABOUT A MONTH AGO    Current Outpatient Prescriptions  Medication Sig Dispense Refill  . ALPRAZolam (XANAX) 1 MG tablet Take 1 mg by mouth 4 (four) times daily.      . carisoprodol (SOMA) 350 MG tablet Take 350 mg by  mouth 2 (two) times daily.      . Diclofenac-Misoprostol (ARTHROTEC) 50-0.2 MG TBEC Take 1 tablet by mouth 2 (two) times daily.      . DULoxetine (CYMBALTA) 30 MG capsule Take 30 mg by mouth 2 (two) times daily.      Marland Kitchen escitalopram (LEXAPRO) 10 MG tablet Take 10 mg by mouth 2 (two) times daily.      . eszopiclone (LUNESTA) 2 MG TABS tablet Take 2 mg by mouth at bedtime. Take immediately before bedtime      . hyoscyamine (LEVBID) 0.375 MG 12 hr tablet Take 0.375 mg by mouth 2 (two) times daily as needed (irritable bowel syndrome).       . irbesartan (AVAPRO) 150 MG tablet Take 150 mg by mouth daily.      . OxyCODONE HCl ER  (OXYCONTIN) 30 MG T12A Take 1 tablet by mouth 3 (three) times daily.      . sildenafil (VIAGRA) 100 MG tablet Take 100 mg by mouth daily as needed for erectile dysfunction.      . tapentadol (NUCYNTA) 50 MG TABS tablet Take 50 mg by mouth See admin instructions. May take up to 7 tablets per day       No current facility-administered medications for this visit.    Allergies  Allergen Reactions  . Aspirin Other (See Comments)    H/O BLEEDING ULCER  . Buspirone Other (See Comments)    MEMORY LOSS  . Klonopin [Clonazepam]     Erectile dysfunction  . Nsaids   . Zoloft [Sertraline Hcl] Other (See Comments)    FELT TERRIBLE  . Reglan [Metoclopramide] Anxiety    EXCITABILITY     Review of Systems no chest pain                                  Chronic bilateral ankle edema                                    No fevers or night sweats  BP 139/86  Pulse 59  Resp 16  Ht 5\' 6"  (1.676 m)  Wt 170 lb (77.111 kg)  BMI 27.45 kg/m2  SpO2 98% Physical Exam Alert and comfortable Lungs clear Grade 4/6 systolic ejection murmur 2+ bilateral ankle edema No evidence of venous insufficiency lower extremities No carotid bruit  Diagnostic Tests: CT chest pending Coronary angiogram results discussed with patient and wife  Impression: Severe aortic stenosis, moderate multivessel CAD  Plan: AVR-CABG December 30 at [facility] hospital using a biologic valve

## 2013-02-06 ENCOUNTER — Encounter (HOSPITAL_COMMUNITY): Payer: Self-pay | Admitting: Pharmacy Technician

## 2013-02-08 NOTE — Pre-Procedure Instructions (Signed)
Max Byrd  02/08/2013   Your procedure is scheduled on:  Tuesday, Dec. 30th at 0730 AM   Report to [facility] Main Entrance "A" at 5:30 AM.  Call this number if you have problems the morning of surgery: [phone]   Remember:   Do not eat food or drink liquids after midnight Monday.   Take these medicines the morning of surgery with A SIP OF WATER: Cymbalta, lexapro, Xanax   Do not wear jewelry-no rings or watches.  Do not wear lotions, powders, or colognes. You may NOT wear deodorant.   Men may shave face and neck.   Do not bring valuables to the hospital.  [facility] is not responsible for any belongings or valuables.               Contacts, dentures or bridgework may not be worn into surgery.  Leave suitcase in the car. After surgery it may be brought to your room.  For patients admitted to the hospital, discharge time is determined by your treatment team.      Special Instructions: Shower using CHG 2 nights before surgery and the night before surgery.  If you shower the day of surgery use CHG.  Use special wash - you have one bottle of CHG for all showers.  You should use approximately 1/3 of the bottle for each shower.   Please read over the following fact sheets that you were given: Pain Booklet, Coughing and Deep Breathing, Blood Transfusion Information, MRSA Information and Surgical Site Infection Prevention

## 2013-02-10 ENCOUNTER — Inpatient Hospital Stay (HOSPITAL_COMMUNITY)
Admission: RE | Admit: 2013-02-10 | Discharge: 2013-02-10 | Disposition: A | Discharge status: Home / Self Care | Payer: Federal, State, Local not specified - PPO | Source: Ambulatory Visit | Attending: Cardiothoracic Surgery | Admitting: Cardiothoracic Surgery

## 2013-02-10 ENCOUNTER — Encounter (HOSPITAL_COMMUNITY)
Admission: RE | Admit: 2013-02-10 | Discharge: 2013-02-10 | Disposition: A | Discharge status: Home / Self Care | Payer: Federal, State, Local not specified - PPO | Source: Ambulatory Visit | Attending: Cardiothoracic Surgery | Admitting: Cardiothoracic Surgery

## 2013-02-10 ENCOUNTER — Ambulatory Visit (HOSPITAL_COMMUNITY)
Admission: RE | Admit: 2013-02-10 | Discharge: 2013-02-10 | Disposition: A | Discharge status: Home / Self Care | Payer: Federal, State, Local not specified - PPO | Source: Ambulatory Visit | Attending: Cardiothoracic Surgery | Admitting: Cardiothoracic Surgery

## 2013-02-10 ENCOUNTER — Ambulatory Visit
Admission: RE | Admit: 2013-02-10 | Discharge: 2013-02-10 | Disposition: A | Discharge status: Home / Self Care | Payer: Federal, State, Local not specified - PPO | Source: Ambulatory Visit | Attending: Cardiothoracic Surgery | Admitting: Cardiothoracic Surgery

## 2013-02-10 ENCOUNTER — Encounter (HOSPITAL_COMMUNITY): Payer: Self-pay

## 2013-02-10 VITALS — BP 121/70 | HR 55 | Temp 97.6°F | Resp 18 | Ht 66.0 in | Wt 173.3 lb

## 2013-02-10 DIAGNOSIS — Z01818 Encounter for other preprocedural examination: Secondary | ICD-10-CM | POA: Insufficient documentation

## 2013-02-10 DIAGNOSIS — I359 Nonrheumatic aortic valve disorder, unspecified: Secondary | ICD-10-CM

## 2013-02-10 DIAGNOSIS — I2510 Atherosclerotic heart disease of native coronary artery without angina pectoris: Secondary | ICD-10-CM

## 2013-02-10 DIAGNOSIS — I350 Nonrheumatic aortic (valve) stenosis: Secondary | ICD-10-CM

## 2013-02-10 DIAGNOSIS — Z01810 Encounter for preprocedural cardiovascular examination: Secondary | ICD-10-CM

## 2013-02-10 DIAGNOSIS — Z01812 Encounter for preprocedural laboratory examination: Secondary | ICD-10-CM | POA: Insufficient documentation

## 2013-02-10 LAB — PULMONARY FUNCTION TEST
DL/VA % pred: 122 %
DL/VA: 5.34 ml/min/mmHg/L
DLCO cor % pred: 128 %
DLCO cor: 34.62 ml/min/mmHg
DLCO unc % pred: 128 %
DLCO unc: 34.62 ml/min/mmHg
FEF 25-75 Pre: 3.42 L/sec
FEF2575-%Pred-Pre: 124 %
FEV1-%Pred-Pre: 111 %
FEV1-Pre: 3.57 L
FEV1FVC-%Pred-Pre: 103 %
FEV6-%Pred-Pre: 112 %
FEV6-Pre: 4.51 L
FEV6FVC-%Pred-Pre: 104 %
FVC-%Pred-Pre: 107 %
FVC-Pre: 4.54 L
Pre FEV1/FVC ratio: 79 %
Pre FEV6/FVC Ratio: 99 %
RV % pred: 89 %
RV: 1.77 L
TLC % pred: 103 %
TLC: 6.37 L

## 2013-02-10 LAB — BLOOD GAS, ARTERIAL
Acid-Base Excess: 1.1 mmol/L (ref 0.0–2.0)
Bicarbonate: 25.6 mEq/L — ABNORMAL HIGH (ref 20.0–24.0)
Drawn by: 344381
FIO2: 0.21 %
O2 Saturation: 98.6 %
Patient temperature: 98.6
TCO2: 27 mmol/L (ref 0–100)
pCO2 arterial: 44.5 mmHg (ref 35.0–45.0)
pH, Arterial: 7.379 (ref 7.350–7.450)
pO2, Arterial: 102 mmHg — ABNORMAL HIGH (ref 80.0–100.0)

## 2013-02-10 LAB — URINALYSIS, ROUTINE W REFLEX MICROSCOPIC
Bilirubin Urine: NEGATIVE
Glucose, UA: NEGATIVE mg/dL
Hgb urine dipstick: NEGATIVE
Ketones, ur: NEGATIVE mg/dL
Nitrite: NEGATIVE
Protein, ur: NEGATIVE mg/dL
Specific Gravity, Urine: 1.021 (ref 1.005–1.030)
Urobilinogen, UA: 0.2 mg/dL (ref 0.0–1.0)
pH: 5.5 (ref 5.0–8.0)

## 2013-02-10 LAB — CBC
HCT: 38.4 % — ABNORMAL LOW (ref 39.0–52.0)
Hemoglobin: 14.2 g/dL (ref 13.0–17.0)
MCH: 33.3 pg (ref 26.0–34.0)
MCHC: 37 g/dL — ABNORMAL HIGH (ref 30.0–36.0)
MCV: 90.1 fL (ref 78.0–100.0)
Platelets: 125 10*3/uL — ABNORMAL LOW (ref 150–400)
RBC: 4.26 MIL/uL (ref 4.22–5.81)
RDW: 12.9 % (ref 11.5–15.5)
WBC: 4.5 10*3/uL (ref 4.0–10.5)

## 2013-02-10 LAB — COMPREHENSIVE METABOLIC PANEL
ALT: 18 U/L (ref 0–53)
AST: 20 U/L (ref 0–37)
Albumin: 3.8 g/dL (ref 3.5–5.2)
Alkaline Phosphatase: 57 U/L (ref 39–117)
BUN: 22 mg/dL (ref 6–23)
CO2: 25 mEq/L (ref 19–32)
Calcium: 8.7 mg/dL (ref 8.4–10.5)
Chloride: 106 mEq/L (ref 96–112)
Creatinine, Ser: 1.1 mg/dL (ref 0.50–1.35)
GFR calc Af Amer: 84 mL/min — ABNORMAL LOW (ref >90)
GFR calc non Af Amer: 73 mL/min — ABNORMAL LOW (ref >90)
Glucose, Bld: 93 mg/dL (ref 70–99)
Potassium: 4.5 mEq/L (ref 3.5–5.1)
Sodium: 141 mEq/L (ref 135–145)
Total Bilirubin: 0.5 mg/dL (ref 0.3–1.2)
Total Protein: 6.2 g/dL (ref 6.0–8.3)

## 2013-02-10 LAB — URINE MICROSCOPIC-ADD ON

## 2013-02-10 LAB — SURGICAL PCR SCREEN
MRSA, PCR: NEGATIVE
Staphylococcus aureus: NEGATIVE

## 2013-02-10 LAB — HEMOGLOBIN A1C
Hgb A1c MFr Bld: 5 % (ref <5.7)
Mean Plasma Glucose: 97 mg/dL (ref <117)

## 2013-02-10 LAB — PROTIME-INR
INR: 1.06 (ref 0.00–1.49)
Prothrombin Time: 13.6 seconds (ref 11.6–15.2)

## 2013-02-10 LAB — ABO/RH: ABO/RH(D): A POS

## 2013-02-10 LAB — APTT: aPTT: 31 seconds (ref 24–37)

## 2013-02-10 MED ORDER — CHLORHEXIDINE GLUCONATE 4 % EX LIQD: 30.0000 mL | CUTANEOUS | Status: DC

## 2013-02-10 NOTE — Progress Notes (Signed)
VASCULAR LAB PRELIMINARY  PRELIMINARY  PRELIMINARY  PRELIMINARY  Pre-op Cardiac Surgery  Carotid Findings:  Bilateral:  1-39% ICA stenosis.  Vertebral artery flow is antegrade.      Upper Extremity Right Left  Brachial Pressures 124  Monophasic  136  Monophasic   Radial Waveforms Biphasic  Biphasic   Ulnar Waveforms Biphasic  Biphasic   Palmar Arch (Allen's Test) Doppler obliterates with radial compression.  Normal with ulnar compression. Doppler normal with radial compression.  Obliterates with ulnar compression     Elvena Oyer, RVT 02/10/2013, 12:48 PM

## 2013-02-13 MED ORDER — DEXMEDETOMIDINE HCL IN NACL 400 MCG/100ML IV SOLN
0.1000 ug/kg/h | INTRAVENOUS | Status: AC
  Administered 2013-02-14: 0.2 ug/kg/h via INTRAVENOUS
  Filled 2013-02-13: qty 100

## 2013-02-13 MED ORDER — PHENYLEPHRINE HCL 10 MG/ML IJ SOLN
30.0000 ug/min | INTRAVENOUS | Status: AC
  Administered 2013-02-14: 20 ug/min via INTRAVENOUS
  Filled 2013-02-13: qty 2

## 2013-02-13 MED ORDER — DEXTROSE 5 % IV SOLN
1.5000 g | INTRAVENOUS | Status: AC
  Administered 2013-02-14: 1.5 g via INTRAVENOUS
  Administered 2013-02-14: .75 g via INTRAVENOUS
  Filled 2013-02-13 (×2): qty 1.5

## 2013-02-13 MED ORDER — SODIUM CHLORIDE 0.9 % IV SOLN
INTRAVENOUS | Status: AC
  Administered 2013-02-14: .9 [IU]/h via INTRAVENOUS
  Filled 2013-02-13: qty 1

## 2013-02-13 MED ORDER — POTASSIUM CHLORIDE 2 MEQ/ML IV SOLN
80.0000 meq | INTRAVENOUS | Status: DC
  Filled 2013-02-13: qty 40

## 2013-02-13 MED ORDER — CEFUROXIME SODIUM 750 MG IJ SOLR
750.0000 mg | INTRAMUSCULAR | Status: DC
  Filled 2013-02-13: qty 750

## 2013-02-13 MED ORDER — METOPROLOL TARTRATE 12.5 MG HALF TABLET
12.5000 mg | ORAL_TABLET | Freq: Once | ORAL | Status: AC
  Administered 2013-02-14: 12.5 mg via ORAL
  Filled 2013-02-13: qty 1

## 2013-02-13 MED ORDER — NITROGLYCERIN IN D5W 200-5 MCG/ML-% IV SOLN
2.0000 ug/min | INTRAVENOUS | Status: AC
  Administered 2013-02-14: 5 ug/min via INTRAVENOUS
  Filled 2013-02-13: qty 250

## 2013-02-13 MED ORDER — PAPAVERINE HCL 30 MG/ML IJ SOLN
INTRAMUSCULAR | Status: AC
  Administered 2013-02-14: 07:00:00
  Filled 2013-02-13: qty 2.5

## 2013-02-13 MED ORDER — EPINEPHRINE HCL 1 MG/ML IJ SOLN
0.5000 ug/min | INTRAVENOUS | Status: DC
  Filled 2013-02-13: qty 4

## 2013-02-13 MED ORDER — AMINOCAPROIC ACID 250 MG/ML IV SOLN
INTRAVENOUS | Status: AC
  Administered 2013-02-14: 70 mL/h via INTRAVENOUS
  Filled 2013-02-13: qty 40

## 2013-02-13 MED ORDER — MAGNESIUM SULFATE 50 % IJ SOLN
40.0000 meq | INTRAMUSCULAR | Status: DC
  Filled 2013-02-13: qty 10

## 2013-02-13 MED ORDER — SODIUM CHLORIDE 0.9 % IV SOLN
INTRAVENOUS | Status: DC
  Filled 2013-02-13: qty 30

## 2013-02-13 MED ORDER — VANCOMYCIN HCL 10 G IV SOLR
1250.0000 mg | INTRAVENOUS | Status: AC
  Administered 2013-02-14: 1250 mg via INTRAVENOUS
  Filled 2013-02-13 (×2): qty 1250

## 2013-02-13 MED ORDER — DOPAMINE-DEXTROSE 3.2-5 MG/ML-% IV SOLN
2.0000 ug/kg/min | INTRAVENOUS | Status: AC
  Administered 2013-02-14: 2.5 ug/kg/min via INTRAVENOUS
  Filled 2013-02-13: qty 250

## 2013-02-14 ENCOUNTER — Inpatient Hospital Stay (HOSPITAL_COMMUNITY): Payer: Federal, State, Local not specified - PPO

## 2013-02-14 ENCOUNTER — Inpatient Hospital Stay (HOSPITAL_COMMUNITY)
Admission: RE | Admit: 2013-02-14 | Discharge: 2013-02-18 | DRG: 220 | Disposition: A | Discharge status: Home / Self Care | Payer: Federal, State, Local not specified - PPO | Source: Ambulatory Visit | Attending: Cardiothoracic Surgery | Admitting: Cardiothoracic Surgery

## 2013-02-14 ENCOUNTER — Encounter (HOSPITAL_COMMUNITY): Payer: Federal, State, Local not specified - PPO | Admitting: Anesthesiology

## 2013-02-14 ENCOUNTER — Encounter (HOSPITAL_COMMUNITY): Payer: Self-pay | Admitting: *Deleted

## 2013-02-14 ENCOUNTER — Encounter (HOSPITAL_COMMUNITY)
Admission: RE | Disposition: A | Discharge status: Home / Self Care | Payer: Federal, State, Local not specified - PPO | Source: Ambulatory Visit | Attending: Cardiothoracic Surgery

## 2013-02-14 ENCOUNTER — Inpatient Hospital Stay (HOSPITAL_COMMUNITY): Payer: Federal, State, Local not specified - PPO | Admitting: Anesthesiology

## 2013-02-14 DIAGNOSIS — G894 Chronic pain syndrome: Secondary | ICD-10-CM | POA: Diagnosis present

## 2013-02-14 DIAGNOSIS — I2510 Atherosclerotic heart disease of native coronary artery without angina pectoris: Secondary | ICD-10-CM

## 2013-02-14 DIAGNOSIS — I359 Nonrheumatic aortic valve disorder, unspecified: Secondary | ICD-10-CM | POA: Diagnosis present

## 2013-02-14 DIAGNOSIS — N182 Chronic kidney disease, stage 2 (mild): Secondary | ICD-10-CM | POA: Diagnosis present

## 2013-02-14 DIAGNOSIS — D696 Thrombocytopenia, unspecified: Secondary | ICD-10-CM | POA: Diagnosis not present

## 2013-02-14 DIAGNOSIS — Z952 Presence of prosthetic heart valve: Secondary | ICD-10-CM

## 2013-02-14 DIAGNOSIS — I447 Left bundle-branch block, unspecified: Secondary | ICD-10-CM | POA: Diagnosis present

## 2013-02-14 DIAGNOSIS — Z79899 Other long term (current) drug therapy: Secondary | ICD-10-CM

## 2013-02-14 DIAGNOSIS — I129 Hypertensive chronic kidney disease with stage 1 through stage 4 chronic kidney disease, or unspecified chronic kidney disease: Secondary | ICD-10-CM | POA: Diagnosis present

## 2013-02-14 DIAGNOSIS — F3289 Other specified depressive episodes: Secondary | ICD-10-CM | POA: Diagnosis present

## 2013-02-14 DIAGNOSIS — F329 Major depressive disorder, single episode, unspecified: Secondary | ICD-10-CM | POA: Diagnosis present

## 2013-02-14 DIAGNOSIS — D62 Acute posthemorrhagic anemia: Secondary | ICD-10-CM | POA: Diagnosis not present

## 2013-02-14 DIAGNOSIS — Z886 Allergy status to analgesic agent status: Secondary | ICD-10-CM

## 2013-02-14 DIAGNOSIS — Z889 Allergy status to unspecified drugs, medicaments and biological substances status: Secondary | ICD-10-CM

## 2013-02-14 DIAGNOSIS — E785 Hyperlipidemia, unspecified: Secondary | ICD-10-CM | POA: Diagnosis present

## 2013-02-14 DIAGNOSIS — F411 Generalized anxiety disorder: Secondary | ICD-10-CM | POA: Diagnosis present

## 2013-02-14 DIAGNOSIS — E8779 Other fluid overload: Secondary | ICD-10-CM | POA: Diagnosis not present

## 2013-02-14 DIAGNOSIS — Z951 Presence of aortocoronary bypass graft: Secondary | ICD-10-CM

## 2013-02-14 DIAGNOSIS — Z85528 Personal history of other malignant neoplasm of kidney: Secondary | ICD-10-CM

## 2013-02-14 HISTORY — PX: CORONARY ARTERY BYPASS GRAFT: SHX141

## 2013-02-14 HISTORY — PX: AORTIC VALVE REPLACEMENT: SHX41

## 2013-02-14 HISTORY — PX: INTRAOPERATIVE TRANSESOPHAGEAL ECHOCARDIOGRAM: SHX5062

## 2013-02-14 LAB — POCT I-STAT 3, ART BLOOD GAS (G3+)
Acid-Base Excess: 2 mmol/L (ref 0.0–2.0)
Acid-base deficit: 1 mmol/L (ref 0.0–2.0)
Acid-base deficit: 1 mmol/L (ref 0.0–2.0)
Bicarbonate: 26.1 mEq/L — ABNORMAL HIGH (ref 20.0–24.0)
Bicarbonate: 24 mEq/L (ref 20.0–24.0)
Bicarbonate: 25.2 mEq/L — ABNORMAL HIGH (ref 20.0–24.0)
Bicarbonate: 24.6 meq/L — ABNORMAL HIGH (ref 20.0–24.0)
Bicarbonate: 24.6 meq/L — ABNORMAL HIGH (ref 20.0–24.0)
O2 Saturation: 100 %
O2 Saturation: 100 %
O2 Saturation: 96 %
O2 Saturation: 99 %
O2 Saturation: 99 %
Patient temperature: 36
Patient temperature: 37.1
Patient temperature: 37.2
TCO2: 27 mmol/L (ref 0–100)
TCO2: 25 mmol/L (ref 0–100)
TCO2: 27 mmol/L (ref 0–100)
TCO2: 26 mmol/L (ref 0–100)
TCO2: 26 mmol/L (ref 0–100)
pCO2 arterial: 35.3 mmHg (ref 35.0–45.0)
pCO2 arterial: 37.7 mmHg (ref 35.0–45.0)
pCO2 arterial: 41.7 mmHg (ref 35.0–45.0)
pCO2 arterial: 46.1 mmHg — ABNORMAL HIGH (ref 35.0–45.0)
pCO2 arterial: 43.2 mmHg (ref 35.0–45.0)
pH, Arterial: 7.477 — ABNORMAL HIGH (ref 7.350–7.450)
pH, Arterial: 7.413 (ref 7.350–7.450)
pH, Arterial: 7.385 (ref 7.350–7.450)
pH, Arterial: 7.336 — ABNORMAL LOW (ref 7.350–7.450)
pH, Arterial: 7.364 (ref 7.350–7.450)
pO2, Arterial: 375 mmHg — ABNORMAL HIGH (ref 80.0–100.0)
pO2, Arterial: 215 mmHg — ABNORMAL HIGH (ref 80.0–100.0)
pO2, Arterial: 83 mmHg (ref 80.0–100.0)
pO2, Arterial: 126 mmHg — ABNORMAL HIGH (ref 80.0–100.0)
pO2, Arterial: 168 mmHg — ABNORMAL HIGH (ref 80.0–100.0)

## 2013-02-14 LAB — POCT I-STAT 4, (NA,K, GLUC, HGB,HCT)
Glucose, Bld: 90 mg/dL (ref 70–99)
Glucose, Bld: 82 mg/dL (ref 70–99)
Glucose, Bld: 96 mg/dL (ref 70–99)
Glucose, Bld: 119 mg/dL — ABNORMAL HIGH (ref 70–99)
Glucose, Bld: 146 mg/dL — ABNORMAL HIGH (ref 70–99)
Glucose, Bld: 134 mg/dL — ABNORMAL HIGH (ref 70–99)
Glucose, Bld: 116 mg/dL — ABNORMAL HIGH (ref 70–99)
HCT: 33 % — ABNORMAL LOW (ref 39.0–52.0)
HCT: 23 % — ABNORMAL LOW (ref 39.0–52.0)
HCT: 30 % — ABNORMAL LOW (ref 39.0–52.0)
HCT: 21 % — ABNORMAL LOW (ref 39.0–52.0)
HCT: 24 % — ABNORMAL LOW (ref 39.0–52.0)
HCT: 27 % — ABNORMAL LOW (ref 39.0–52.0)
HCT: 33 % — ABNORMAL LOW (ref 39.0–52.0)
Hemoglobin: 11.2 g/dL — ABNORMAL LOW (ref 13.0–17.0)
Hemoglobin: 10.2 g/dL — ABNORMAL LOW (ref 13.0–17.0)
Hemoglobin: 7.8 g/dL — ABNORMAL LOW (ref 13.0–17.0)
Hemoglobin: 7.1 g/dL — ABNORMAL LOW (ref 13.0–17.0)
Hemoglobin: 8.2 g/dL — ABNORMAL LOW (ref 13.0–17.0)
Hemoglobin: 9.2 g/dL — ABNORMAL LOW (ref 13.0–17.0)
Hemoglobin: 11.2 g/dL — ABNORMAL LOW (ref 13.0–17.0)
Potassium: 4.4 mEq/L (ref 3.7–5.3)
Potassium: 3.7 mEq/L (ref 3.7–5.3)
Potassium: 3.8 mEq/L (ref 3.7–5.3)
Potassium: 3.9 mEq/L (ref 3.7–5.3)
Potassium: 3.9 mEq/L (ref 3.7–5.3)
Potassium: 3.5 mEq/L — ABNORMAL LOW (ref 3.7–5.3)
Potassium: 3.5 meq/L — ABNORMAL LOW (ref 3.7–5.3)
Sodium: 140 mEq/L (ref 137–147)
Sodium: 138 mEq/L (ref 137–147)
Sodium: 140 mEq/L (ref 137–147)
Sodium: 133 mEq/L — ABNORMAL LOW (ref 137–147)
Sodium: 136 mEq/L — ABNORMAL LOW (ref 137–147)
Sodium: 137 mEq/L (ref 137–147)
Sodium: 140 meq/L (ref 137–147)

## 2013-02-14 LAB — CBC
HCT: 30 % — ABNORMAL LOW (ref 39.0–52.0)
Hemoglobin: 12.5 g/dL — ABNORMAL LOW (ref 13.0–17.0)
Hemoglobin: 11.1 g/dL — ABNORMAL LOW (ref 13.0–17.0)
MCH: 32.6 pg (ref 26.0–34.0)
MCH: 32.4 pg (ref 26.0–34.0)
MCHC: 37.3 g/dL — ABNORMAL HIGH (ref 30.0–36.0)
MCHC: 37 g/dL — ABNORMAL HIGH (ref 30.0–36.0)
MCV: 87.5 fL (ref 78.0–100.0)
Platelets: 157 10*3/uL (ref 150–400)
Platelets: 152 10*3/uL (ref 150–400)
RBC: 3.83 MIL/uL — ABNORMAL LOW (ref 4.22–5.81)
RBC: 3.43 MIL/uL — ABNORMAL LOW (ref 4.22–5.81)
RDW: 12.9 % (ref 11.5–15.5)
RDW: 13.2 % (ref 11.5–15.5)
WBC: 12 10*3/uL — ABNORMAL HIGH (ref 4.0–10.5)

## 2013-02-14 LAB — GLUCOSE, CAPILLARY
Glucose-Capillary: 96 mg/dL (ref 70–99)
Glucose-Capillary: 119 mg/dL — ABNORMAL HIGH (ref 70–99)
Glucose-Capillary: 125 mg/dL — ABNORMAL HIGH (ref 70–99)
Glucose-Capillary: 108 mg/dL — ABNORMAL HIGH (ref 70–99)

## 2013-02-14 LAB — POCT I-STAT, CHEM 8
BUN: 15 mg/dL (ref 6–23)
Calcium, Ion: 1.16 mmol/L (ref 1.12–1.23)
Chloride: 102 mEq/L (ref 96–112)
Creatinine, Ser: 0.8 mg/dL (ref 0.50–1.35)
Glucose, Bld: 176 mg/dL — ABNORMAL HIGH (ref 70–99)
HCT: 30 % — ABNORMAL LOW (ref 39.0–52.0)
Hemoglobin: 10.2 g/dL — ABNORMAL LOW (ref 13.0–17.0)
Potassium: 4.1 mEq/L (ref 3.7–5.3)
Sodium: 139 mEq/L (ref 137–147)
TCO2: 25 mmol/L (ref 0–100)

## 2013-02-14 LAB — PLATELET COUNT: Platelets: 80 10*3/uL — ABNORMAL LOW (ref 150–400)

## 2013-02-14 LAB — PROTIME-INR
INR: 1.33 (ref 0.00–1.49)
Prothrombin Time: 16.2 seconds — ABNORMAL HIGH (ref 11.6–15.2)

## 2013-02-14 LAB — CREATININE, SERUM
Creatinine, Ser: 0.84 mg/dL (ref 0.50–1.35)
GFR calc Af Amer: >90 mL/min (ref >90)
GFR calc non Af Amer: >90 mL/min (ref >90)

## 2013-02-14 LAB — HEMOGLOBIN AND HEMATOCRIT, BLOOD
HCT: 25.1 % — ABNORMAL LOW (ref 39.0–52.0)
Hemoglobin: 9.2 g/dL — ABNORMAL LOW (ref 13.0–17.0)

## 2013-02-14 LAB — POCT I-STAT GLUCOSE
Glucose, Bld: 109 mg/dL — ABNORMAL HIGH (ref 70–99)
Operator id: 156951

## 2013-02-14 LAB — MAGNESIUM: Magnesium: 3.2 mg/dL — ABNORMAL HIGH (ref 1.5–2.5)

## 2013-02-14 LAB — PREPARE RBC (CROSSMATCH)

## 2013-02-14 SURGERY — REPLACEMENT, AORTIC VALVE, OPEN
Anesthesia: General | Site: Chest

## 2013-02-14 MED ORDER — MIDAZOLAM HCL 2 MG/2ML IJ SOLN
INTRAMUSCULAR | Status: AC
  Filled 2013-02-14: qty 2

## 2013-02-14 MED ORDER — LACTATED RINGERS IV SOLN
INTRAVENOUS | Status: DC | PRN
  Administered 2013-02-14: 07:00:00 via INTRAVENOUS

## 2013-02-14 MED ORDER — MORPHINE SULFATE 2 MG/ML IJ SOLN: 2.0000 mg | INTRAMUSCULAR | Status: DC | PRN

## 2013-02-14 MED ORDER — LACTATED RINGERS IV SOLN
INTRAVENOUS | Status: DC | PRN
  Administered 2013-02-14 (×2): via INTRAVENOUS

## 2013-02-14 MED ORDER — ACETAMINOPHEN 500 MG PO TABS
1000.0000 mg | ORAL_TABLET | Freq: Four times a day (QID) | ORAL | Status: DC
  Administered 2013-02-14 – 2013-02-16 (×4): 1000 mg via ORAL
  Filled 2013-02-14 (×11): qty 2

## 2013-02-14 MED ORDER — SODIUM CHLORIDE 0.9 % IJ SOLN: 3.0000 mL | Freq: Two times a day (BID) | INTRAMUSCULAR | Status: DC

## 2013-02-14 MED ORDER — PROPOFOL 10 MG/ML IV BOLUS
INTRAVENOUS | Status: DC | PRN
  Administered 2013-02-14: 100 mg via INTRAVENOUS
  Administered 2013-02-14: 60 mg via INTRAVENOUS

## 2013-02-14 MED ORDER — BISACODYL 10 MG RE SUPP: 10.0000 mg | Freq: Every day | RECTAL | Status: DC

## 2013-02-14 MED ORDER — VANCOMYCIN HCL IN DEXTROSE 1-5 GM/200ML-% IV SOLN
1000.0000 mg | Freq: Once | INTRAVENOUS | Status: DC
  Filled 2013-02-14: qty 200

## 2013-02-14 MED ORDER — INSULIN REGULAR BOLUS VIA INFUSION
0.0000 [IU] | Freq: Three times a day (TID) | INTRAVENOUS | Status: DC
  Filled 2013-02-14: qty 10

## 2013-02-14 MED ORDER — NITROGLYCERIN IN D5W 200-5 MCG/ML-% IV SOLN: 0.0000 ug/min | INTRAVENOUS | Status: DC

## 2013-02-14 MED ORDER — FAMOTIDINE IN NACL 20-0.9 MG/50ML-% IV SOLN
20.0000 mg | Freq: Two times a day (BID) | INTRAVENOUS | Status: AC
  Administered 2013-02-14: 20 mg via INTRAVENOUS
  Filled 2013-02-14: qty 50

## 2013-02-14 MED ORDER — PANTOPRAZOLE SODIUM 40 MG PO TBEC
40.0000 mg | DELAYED_RELEASE_TABLET | Freq: Every day | ORAL | Status: DC
  Administered 2013-02-16 – 2013-02-18 (×3): 40 mg via ORAL
  Filled 2013-02-14 (×3): qty 1

## 2013-02-14 MED ORDER — FENTANYL CITRATE 0.05 MG/ML IJ SOLN
INTRAMUSCULAR | Status: DC | PRN
  Administered 2013-02-14: 100 ug via INTRAVENOUS

## 2013-02-14 MED ORDER — BISACODYL 5 MG PO TBEC
10.0000 mg | DELAYED_RELEASE_TABLET | Freq: Every day | ORAL | Status: DC
  Administered 2013-02-15 – 2013-02-16 (×2): 10 mg via ORAL
  Filled 2013-02-14 (×2): qty 2

## 2013-02-14 MED ORDER — SODIUM CHLORIDE 0.9 % IJ SOLN: 3.0000 mL | INTRAMUSCULAR | Status: DC | PRN

## 2013-02-14 MED ORDER — DULOXETINE HCL 30 MG PO CPEP
30.0000 mg | ORAL_CAPSULE | Freq: Two times a day (BID) | ORAL | Status: DC
  Administered 2013-02-15 – 2013-02-18 (×7): 30 mg via ORAL
  Filled 2013-02-14 (×8): qty 1

## 2013-02-14 MED ORDER — ASPIRIN EC 81 MG PO TBEC
81.0000 mg | DELAYED_RELEASE_TABLET | Freq: Every day | ORAL | Status: DC
  Administered 2013-02-15 – 2013-02-18 (×4): 81 mg via ORAL
  Filled 2013-02-14 (×4): qty 1

## 2013-02-14 MED ORDER — POTASSIUM CHLORIDE 10 MEQ/50ML IV SOLN
10.0000 meq | INTRAVENOUS | Status: AC
  Administered 2013-02-14 (×3): 10 meq via INTRAVENOUS

## 2013-02-14 MED ORDER — FENTANYL CITRATE 0.05 MG/ML IJ SOLN
50.0000 ug | INTRAMUSCULAR | Status: DC | PRN
  Administered 2013-02-14 – 2013-02-15 (×9): 50 ug via INTRAVENOUS
  Filled 2013-02-14 (×8): qty 2

## 2013-02-14 MED ORDER — LACTATED RINGERS IV SOLN
INTRAVENOUS | Status: DC
  Administered 2013-02-14: 19:00:00 via INTRAVENOUS
  Administered 2013-02-15: 20 mL/h via INTRAVENOUS

## 2013-02-14 MED ORDER — OXYCODONE HCL 5 MG PO TABS
10.0000 mg | ORAL_TABLET | ORAL | Status: DC | PRN
  Administered 2013-02-15: 10 mg via ORAL
  Filled 2013-02-14: qty 2

## 2013-02-14 MED ORDER — ACETAMINOPHEN 160 MG/5ML PO SOLN: 1000.0000 mg | Freq: Four times a day (QID) | ORAL | Status: DC

## 2013-02-14 MED ORDER — SODIUM CHLORIDE 0.9 % IJ SOLN
OROMUCOSAL | Status: DC | PRN
  Administered 2013-02-14: 09:00:00 via TOPICAL

## 2013-02-14 MED ORDER — OXYCODONE HCL ER 30 MG PO T12A: 1.0000 | EXTENDED_RELEASE_TABLET | Freq: Three times a day (TID) | ORAL | Status: DC

## 2013-02-14 MED ORDER — POTASSIUM CHLORIDE 10 MEQ/50ML IV SOLN
10.0000 meq | Freq: Once | INTRAVENOUS | Status: AC
  Administered 2013-02-14: 10 meq via INTRAVENOUS

## 2013-02-14 MED ORDER — SODIUM CHLORIDE 0.45 % IV SOLN: INTRAVENOUS | Status: DC

## 2013-02-14 MED ORDER — VECURONIUM BROMIDE 10 MG IV SOLR
INTRAVENOUS | Status: DC | PRN
  Administered 2013-02-14: 7 mg via INTRAVENOUS
  Administered 2013-02-14: 2 mg via INTRAVENOUS
  Administered 2013-02-14 (×2): 3 mg via INTRAVENOUS
  Administered 2013-02-14 (×3): 2 mg via INTRAVENOUS
  Administered 2013-02-14: 3 mg via INTRAVENOUS
  Administered 2013-02-14 (×2): 2 mg via INTRAVENOUS

## 2013-02-14 MED ORDER — FENTANYL CITRATE 0.05 MG/ML IJ SOLN
INTRAMUSCULAR | Status: AC
  Filled 2013-02-14: qty 2

## 2013-02-14 MED ORDER — DEXTROSE 5 % IV SOLN
1.5000 g | Freq: Two times a day (BID) | INTRAVENOUS | Status: AC
  Administered 2013-02-14 – 2013-02-16 (×4): 1.5 g via INTRAVENOUS
  Filled 2013-02-14 (×4): qty 1.5

## 2013-02-14 MED ORDER — OXYCODONE HCL ER 20 MG PO T12A
30.0000 mg | EXTENDED_RELEASE_TABLET | Freq: Three times a day (TID) | ORAL | Status: DC
  Administered 2013-02-15 – 2013-02-16 (×6): 30 mg via ORAL
  Filled 2013-02-14 (×6): qty 2

## 2013-02-14 MED ORDER — DEXMEDETOMIDINE HCL IN NACL 200 MCG/50ML IV SOLN: 0.1000 ug/kg/h | INTRAVENOUS | Status: DC

## 2013-02-14 MED ORDER — ONDANSETRON HCL 4 MG/2ML IJ SOLN
4.0000 mg | Freq: Four times a day (QID) | INTRAMUSCULAR | Status: DC | PRN
  Administered 2013-02-15: 4 mg via INTRAVENOUS
  Filled 2013-02-14: qty 2

## 2013-02-14 MED ORDER — SODIUM CHLORIDE 0.9 % IV SOLN
INTRAVENOUS | Status: DC
  Filled 2013-02-14: qty 1

## 2013-02-14 MED ORDER — CARISOPRODOL 350 MG PO TABS
350.0000 mg | ORAL_TABLET | Freq: Two times a day (BID) | ORAL | Status: DC
  Administered 2013-02-15 – 2013-02-18 (×7): 350 mg via ORAL
  Filled 2013-02-14 (×7): qty 1

## 2013-02-14 MED ORDER — METOPROLOL TARTRATE 25 MG/10 ML ORAL SUSPENSION
12.5000 mg | Freq: Two times a day (BID) | ORAL | Status: DC
  Filled 2013-02-14 (×5): qty 5

## 2013-02-14 MED ORDER — DICLOFENAC SODIUM 50 MG PO TBEC
50.0000 mg | DELAYED_RELEASE_TABLET | Freq: Two times a day (BID) | ORAL | Status: DC
  Filled 2013-02-14: qty 1

## 2013-02-14 MED ORDER — ESCITALOPRAM OXALATE 10 MG PO TABS
10.0000 mg | ORAL_TABLET | Freq: Two times a day (BID) | ORAL | Status: DC
  Administered 2013-02-15 – 2013-02-18 (×7): 10 mg via ORAL
  Filled 2013-02-14 (×8): qty 1

## 2013-02-14 MED ORDER — ACETAMINOPHEN 650 MG RE SUPP
650.0000 mg | Freq: Once | RECTAL | Status: AC
  Filled 2013-02-14: qty 1

## 2013-02-14 MED ORDER — LACTATED RINGERS IV SOLN: 500.0000 mL | Freq: Once | INTRAVENOUS | Status: DC | PRN

## 2013-02-14 MED ORDER — SODIUM CHLORIDE 0.9 % IV SOLN
250.0000 mL | INTRAVENOUS | Status: DC
  Administered 2013-02-15: 250 mL via INTRAVENOUS

## 2013-02-14 MED ORDER — SUFENTANIL CITRATE 50 MCG/ML IV SOLN
INTRAVENOUS | Status: DC | PRN
  Administered 2013-02-14: 20 ug via INTRAVENOUS
  Administered 2013-02-14 (×2): 30 ug via INTRAVENOUS
  Administered 2013-02-14: 100 ug via INTRAVENOUS
  Administered 2013-02-14 (×2): 20 ug via INTRAVENOUS
  Administered 2013-02-14: 30 ug via INTRAVENOUS

## 2013-02-14 MED ORDER — PLASMA-LYTE 148 IV SOLN
INTRAVENOUS | Status: AC
  Administered 2013-02-14: 10:00:00
  Filled 2013-02-14: qty 2.5

## 2013-02-14 MED ORDER — SODIUM CHLORIDE 0.9 % IV SOLN
20.0000 ug | Freq: Once | INTRAVENOUS | Status: AC
  Administered 2013-02-14: 20 ug via INTRAVENOUS
  Filled 2013-02-14: qty 5

## 2013-02-14 MED ORDER — DOCUSATE SODIUM 100 MG PO CAPS
200.0000 mg | ORAL_CAPSULE | Freq: Every day | ORAL | Status: DC
  Administered 2013-02-15 – 2013-02-16 (×2): 200 mg via ORAL
  Filled 2013-02-14 (×2): qty 2

## 2013-02-14 MED ORDER — SODIUM CHLORIDE 0.9 % IV SOLN: INTRAVENOUS | Status: DC

## 2013-02-14 MED ORDER — MIDAZOLAM HCL 2 MG/2ML IJ SOLN: 2.0000 mg | INTRAMUSCULAR | Status: DC | PRN

## 2013-02-14 MED ORDER — PROTAMINE SULFATE 10 MG/ML IV SOLN
INTRAVENOUS | Status: DC | PRN
  Administered 2013-02-14: 200 mg via INTRAVENOUS

## 2013-02-14 MED ORDER — METOPROLOL TARTRATE 12.5 MG HALF TABLET
12.5000 mg | ORAL_TABLET | Freq: Two times a day (BID) | ORAL | Status: DC
  Administered 2013-02-15 – 2013-02-16 (×3): 12.5 mg via ORAL
  Filled 2013-02-14 (×5): qty 1

## 2013-02-14 MED ORDER — DOPAMINE-DEXTROSE 3.2-5 MG/ML-% IV SOLN: 0.0000 ug/kg/min | INTRAVENOUS | Status: DC

## 2013-02-14 MED ORDER — MIDAZOLAM HCL 5 MG/5ML IJ SOLN
INTRAMUSCULAR | Status: DC | PRN
  Administered 2013-02-14: 3 mg via INTRAVENOUS
  Administered 2013-02-14: 2 mg via INTRAVENOUS
  Administered 2013-02-14: 3 mg via INTRAVENOUS
  Administered 2013-02-14: 2 mg via INTRAVENOUS

## 2013-02-14 MED ORDER — DICLOFENAC-MISOPROSTOL 50-0.2 MG PO TBEC: 1.0000 | DELAYED_RELEASE_TABLET | Freq: Two times a day (BID) | ORAL | Status: DC

## 2013-02-14 MED ORDER — MORPHINE SULFATE 2 MG/ML IJ SOLN: 1.0000 mg | INTRAMUSCULAR | Status: DC | PRN

## 2013-02-14 MED ORDER — ALBUMIN HUMAN 5 % IV SOLN
250.0000 mL | INTRAVENOUS | Status: DC | PRN
  Administered 2013-02-14 (×2): 250 mL via INTRAVENOUS

## 2013-02-14 MED ORDER — HEMOSTATIC AGENTS (NO CHARGE) OPTIME
TOPICAL | Status: DC | PRN
  Administered 2013-02-14 (×3): 1 via TOPICAL

## 2013-02-14 MED ORDER — PHENYLEPHRINE HCL 10 MG/ML IJ SOLN
0.0000 ug/min | INTRAMUSCULAR | Status: DC
  Filled 2013-02-14: qty 2

## 2013-02-14 MED ORDER — DICLOFENAC SODIUM 50 MG PO TBEC
50.0000 mg | DELAYED_RELEASE_TABLET | Freq: Two times a day (BID) | ORAL | Status: DC
  Administered 2013-02-14 – 2013-02-18 (×8): 50 mg via ORAL
  Filled 2013-02-14 (×9): qty 1

## 2013-02-14 MED ORDER — OXYCODONE HCL 5 MG PO TABS: 10.0000 mg | ORAL_TABLET | Freq: Four times a day (QID) | ORAL | Status: DC | PRN

## 2013-02-14 MED ORDER — MAGNESIUM SULFATE 40 MG/ML IJ SOLN
4.0000 g | Freq: Once | INTRAMUSCULAR | Status: AC
  Administered 2013-02-14: 4 g via INTRAVENOUS
  Filled 2013-02-14: qty 100

## 2013-02-14 MED ORDER — 0.9 % SODIUM CHLORIDE (POUR BTL) OPTIME
TOPICAL | Status: DC | PRN
  Administered 2013-02-14: 1000 mL

## 2013-02-14 MED ORDER — VANCOMYCIN HCL IN DEXTROSE 1-5 GM/200ML-% IV SOLN
1000.0000 mg | Freq: Two times a day (BID) | INTRAVENOUS | Status: AC
  Administered 2013-02-14 – 2013-02-15 (×3): 1000 mg via INTRAVENOUS
  Filled 2013-02-14 (×3): qty 200

## 2013-02-14 MED ORDER — ACETAMINOPHEN 160 MG/5ML PO SOLN
650.0000 mg | Freq: Once | ORAL | Status: AC
  Administered 2013-02-14: 650 mg
  Filled 2013-02-14: qty 20.3

## 2013-02-14 MED ORDER — HEPARIN SODIUM (PORCINE) 1000 UNIT/ML IJ SOLN
INTRAMUSCULAR | Status: DC | PRN
  Administered 2013-02-14: 3000 [IU] via INTRAVENOUS
  Administered 2013-02-14: 22000 [IU] via INTRAVENOUS

## 2013-02-14 MED ORDER — METOPROLOL TARTRATE 1 MG/ML IV SOLN: 2.5000 mg | INTRAVENOUS | Status: DC | PRN

## 2013-02-14 MED ORDER — ARTIFICIAL TEARS OP OINT
TOPICAL_OINTMENT | OPHTHALMIC | Status: DC | PRN
  Administered 2013-02-14: 1 via OPHTHALMIC

## 2013-02-14 MED ORDER — MISOPROSTOL 200 MCG PO TABS
200.0000 ug | ORAL_TABLET | Freq: Two times a day (BID) | ORAL | Status: DC
  Filled 2013-02-14: qty 1

## 2013-02-14 MED ORDER — HYOSCYAMINE SULFATE ER 0.375 MG PO TB12
0.3750 mg | ORAL_TABLET | Freq: Two times a day (BID) | ORAL | Status: DC
  Administered 2013-02-15 – 2013-02-18 (×7): 0.375 mg via ORAL
  Filled 2013-02-14 (×8): qty 1

## 2013-02-14 MED ORDER — SODIUM CHLORIDE 0.9 % IV SOLN
1.0000 g/h | Freq: Once | INTRAVENOUS | Status: DC
  Filled 2013-02-14: qty 20

## 2013-02-14 MED ORDER — ALBUMIN HUMAN 5 % IV SOLN
12.5000 g | INTRAVENOUS | Status: DC | PRN
  Administered 2013-02-14: 12.5 g via INTRAVENOUS
  Filled 2013-02-14: qty 250

## 2013-02-14 MED ORDER — MISOPROSTOL 200 MCG PO TABS
200.0000 ug | ORAL_TABLET | Freq: Two times a day (BID) | ORAL | Status: DC
  Administered 2013-02-14 – 2013-02-18 (×8): 200 ug via ORAL
  Filled 2013-02-14 (×9): qty 1

## 2013-02-14 SURGICAL SUPPLY — 140 items
ADAPTER CARDIO PERF ANTE/RETRO (ADAPTER) ×4 IMPLANT
APPLICATOR COTTON TIP 6IN STRL (MISCELLANEOUS) ×4 IMPLANT
ATTRACTOMAT 16X20 MAGNETIC DRP (DRAPES) ×4 IMPLANT
BAG DECANTER FOR FLEXI CONT (MISCELLANEOUS) ×8 IMPLANT
BANDAGE ELASTIC 4 VELCRO ST LF (GAUZE/BANDAGES/DRESSINGS) ×4 IMPLANT
BANDAGE ELASTIC 6 VELCRO ST LF (GAUZE/BANDAGES/DRESSINGS) ×4 IMPLANT
BANDAGE GAUZE ELAST BULKY 4 IN (GAUZE/BANDAGES/DRESSINGS) ×4 IMPLANT
BASKET HEART  (ORDER IN 25'S) (MISCELLANEOUS) ×1
BASKET HEART (ORDER IN 25'S) (MISCELLANEOUS) ×1
BASKET HEART (ORDER IN 25S) (MISCELLANEOUS) ×2 IMPLANT
BLADE STERNUM SYSTEM 6 (BLADE) ×4 IMPLANT
BLADE SURG 11 STRL SS (BLADE) ×8 IMPLANT
BLADE SURG 12 STRL SS (BLADE) ×4 IMPLANT
BLADE SURG 15 STRL LF DISP TIS (BLADE) ×4 IMPLANT
BLADE SURG 15 STRL SS (BLADE) ×4
BLADE SURG ROTATE 9660 (MISCELLANEOUS) ×4 IMPLANT
BNDG GAUZE ELAST 4 BULKY (GAUZE/BANDAGES/DRESSINGS) ×4 IMPLANT
CANISTER SUCTION 2500CC (MISCELLANEOUS) ×4 IMPLANT
CANNULA AORTIC HI-FLOW 6.5M20F (CANNULA) ×4 IMPLANT
CANNULA ARTERIAL NVNT 3/8 20FR (MISCELLANEOUS) ×4 IMPLANT
CANNULA GUNDRY RCSP 15FR (MISCELLANEOUS) ×4 IMPLANT
CANNULA VENOUS LOW PROF 32X40 (CANNULA) ×4 IMPLANT
CANNULA VENOUS LOW PROF 34X46 (CANNULA) ×4 IMPLANT
CANNULA VENOUS MAL SGL STG 40 (MISCELLANEOUS) IMPLANT
CANNULA VESSEL 3MM BLUNT TIP (CANNULA) ×12 IMPLANT
CANNULAE VENOUS MAL SGL STG 40 (MISCELLANEOUS)
CARDIAC SUCTION (MISCELLANEOUS) ×4 IMPLANT
CATH CPB KIT VANTRIGT (MISCELLANEOUS) ×4 IMPLANT
CATH HEART VENT LEFT (CATHETERS) ×2 IMPLANT
CATH RETROPLEGIA CORONARY 14FR (CATHETERS) IMPLANT
CATH ROBINSON RED A/P 18FR (CATHETERS) ×12 IMPLANT
CATH THORACIC 36FR RT ANG (CATHETERS) ×4 IMPLANT
CLIP FOGARTY SPRING 6M (CLIP) IMPLANT
CLIP TI WIDE RED SMALL 24 (CLIP) ×12 IMPLANT
CONT SPEC 4OZ CLIKSEAL STRL BL (MISCELLANEOUS) ×4 IMPLANT
CONT SPECI 4OZ STER CLIK (MISCELLANEOUS) ×4 IMPLANT
COUNTER NEEDLE 20 DBL MAG RED (NEEDLE) ×4 IMPLANT
COVER SURGICAL LIGHT HANDLE (MISCELLANEOUS) ×8 IMPLANT
CRADLE DONUT ADULT HEAD (MISCELLANEOUS) ×4 IMPLANT
DERMABOND ADVANCED (GAUZE/BANDAGES/DRESSINGS) ×2
DERMABOND ADVANCED .7 DNX12 (GAUZE/BANDAGES/DRESSINGS) ×2 IMPLANT
DRAIN CHANNEL 32F RND 10.7 FF (WOUND CARE) ×4 IMPLANT
DRAPE CARDIOVASCULAR INCISE (DRAPES) ×2
DRAPE SLUSH/WARMER DISC (DRAPES) ×4 IMPLANT
DRAPE SRG 135X102X78XABS (DRAPES) ×2 IMPLANT
DRSG AQUACEL AG ADV 3.5X14 (GAUZE/BANDAGES/DRESSINGS) ×4 IMPLANT
ELECT BLADE 4.0 EZ CLEAN MEGAD (MISCELLANEOUS) ×4
ELECT BLADE 6.5 EXT (BLADE) ×4 IMPLANT
ELECT CAUTERY BLADE 6.4 (BLADE) ×4 IMPLANT
ELECT REM PT RETURN 9FT ADLT (ELECTROSURGICAL) ×8
ELECTRODE BLDE 4.0 EZ CLN MEGD (MISCELLANEOUS) ×2 IMPLANT
ELECTRODE REM PT RTRN 9FT ADLT (ELECTROSURGICAL) ×4 IMPLANT
FLOSEAL 10ML (HEMOSTASIS) IMPLANT
GLOVE BIO SURGEON STRL SZ 6 (GLOVE) ×8 IMPLANT
GLOVE BIO SURGEON STRL SZ 6.5 (GLOVE) ×15 IMPLANT
GLOVE BIO SURGEON STRL SZ7 (GLOVE) ×12 IMPLANT
GLOVE BIO SURGEON STRL SZ7.5 (GLOVE) ×16 IMPLANT
GLOVE BIO SURGEONS STRL SZ 6.5 (GLOVE) ×5
GLOVE BIOGEL PI IND STRL 6 (GLOVE) ×6 IMPLANT
GLOVE BIOGEL PI IND STRL 6.5 (GLOVE) ×8 IMPLANT
GLOVE BIOGEL PI IND STRL 7.5 (GLOVE) ×4 IMPLANT
GLOVE BIOGEL PI INDICATOR 6 (GLOVE) ×6
GLOVE BIOGEL PI INDICATOR 6.5 (GLOVE) ×8
GLOVE BIOGEL PI INDICATOR 7.5 (GLOVE) ×4
GOWN STRL NON-REIN LRG LVL3 (GOWN DISPOSABLE) ×32 IMPLANT
HEMOSTAT POWDER SURGIFOAM 1G (HEMOSTASIS) ×16 IMPLANT
HEMOSTAT SURGICEL 2X14 (HEMOSTASIS) ×4 IMPLANT
INSERT FOGARTY 61MM (MISCELLANEOUS) ×4 IMPLANT
INSERT FOGARTY XLG (MISCELLANEOUS) IMPLANT
IV ADAPTER SYR DOUBLE MALE LL (MISCELLANEOUS) ×4 IMPLANT
KIT BASIN OR (CUSTOM PROCEDURE TRAY) ×4 IMPLANT
KIT ROOM TURNOVER OR (KITS) ×4 IMPLANT
KIT SUCTION CATH 14FR (SUCTIONS) ×4 IMPLANT
KIT VASOVIEW W/TROCAR VH 2000 (KITS) ×4 IMPLANT
LEAD PACING MYOCARDI (MISCELLANEOUS) ×4 IMPLANT
LINE VENT (MISCELLANEOUS) ×4 IMPLANT
MARKER GRAFT CORONARY BYPASS (MISCELLANEOUS) ×12 IMPLANT
NS IRRIG 1000ML POUR BTL (IV SOLUTION) ×24 IMPLANT
PACK OPEN HEART (CUSTOM PROCEDURE TRAY) ×4 IMPLANT
PAD ARMBOARD 7.5X6 YLW CONV (MISCELLANEOUS) ×8 IMPLANT
PAD ELECT DEFIB RADIOL ZOLL (MISCELLANEOUS) ×4 IMPLANT
PENCIL BUTTON HOLSTER BLD 10FT (ELECTRODE) ×4 IMPLANT
PUNCH AORTIC ROTATE 4.0MM (MISCELLANEOUS) ×4 IMPLANT
PUNCH AORTIC ROTATE 4.5MM 8IN (MISCELLANEOUS) IMPLANT
PUNCH AORTIC ROTATE 5MM 8IN (MISCELLANEOUS) IMPLANT
SET CARDIOPLEGIA MPS 5001102 (MISCELLANEOUS) ×4 IMPLANT
SOLUTION ANTI FOG 6CC (MISCELLANEOUS) ×4 IMPLANT
SPONGE GAUZE 4X4 12PLY (GAUZE/BANDAGES/DRESSINGS) ×8 IMPLANT
SPONGE GAUZE 4X4 12PLY STER LF (GAUZE/BANDAGES/DRESSINGS) ×8 IMPLANT
SPONGE LAP 18X18 X RAY DECT (DISPOSABLE) ×20 IMPLANT
SPONGE LAP 4X18 X RAY DECT (DISPOSABLE) ×8 IMPLANT
SURGIFLO W/THROMBIN 8M KIT (HEMOSTASIS) ×8 IMPLANT
SUT BONE WAX W31G (SUTURE) ×4 IMPLANT
SUT ETHIBON 2 0 V 52N 30 (SUTURE) ×12 IMPLANT
SUT ETHIBON EXCEL 2-0 V-5 (SUTURE) ×4 IMPLANT
SUT ETHIBOND 2 0 SH (SUTURE) ×2
SUT ETHIBOND 2 0 SH 36X2 (SUTURE) ×2 IMPLANT
SUT ETHIBOND V-5 VALVE (SUTURE) ×4 IMPLANT
SUT MNCRL AB 4-0 PS2 18 (SUTURE) ×8 IMPLANT
SUT PROLENE 3 0 RB 1 (SUTURE) ×8 IMPLANT
SUT PROLENE 3 0 SH 1 (SUTURE) IMPLANT
SUT PROLENE 3 0 SH DA (SUTURE) IMPLANT
SUT PROLENE 3 0 SH1 36 (SUTURE) ×12 IMPLANT
SUT PROLENE 4 0 RB 1 (SUTURE) ×34
SUT PROLENE 4 0 SH DA (SUTURE) ×32 IMPLANT
SUT PROLENE 4-0 RB1 .5 CRCL 36 (SUTURE) ×34 IMPLANT
SUT PROLENE 5 0 C 1 36 (SUTURE) ×4 IMPLANT
SUT PROLENE 6 0 C 1 30 (SUTURE) ×8 IMPLANT
SUT PROLENE 6 0 CC (SUTURE) ×24 IMPLANT
SUT PROLENE 7 0 DA (SUTURE) IMPLANT
SUT PROLENE 8 0 BV175 6 (SUTURE) IMPLANT
SUT PROLENE BLUE 7 0 (SUTURE) ×12 IMPLANT
SUT SILK  1 MH (SUTURE)
SUT SILK 1 MH (SUTURE) IMPLANT
SUT SILK 2 0 SH CR/8 (SUTURE) ×4 IMPLANT
SUT SILK 3 0 SH CR/8 (SUTURE) IMPLANT
SUT STEEL 6MS V (SUTURE) ×8 IMPLANT
SUT STEEL SZ 6 DBL 3X14 BALL (SUTURE) ×4 IMPLANT
SUT VIC AB 1 CTX 36 (SUTURE) ×4
SUT VIC AB 1 CTX36XBRD ANBCTR (SUTURE) ×4 IMPLANT
SUT VIC AB 2-0 CT1 27 (SUTURE) ×4
SUT VIC AB 2-0 CT1 TAPERPNT 27 (SUTURE) ×4 IMPLANT
SUT VIC AB 2-0 CTX 27 (SUTURE) IMPLANT
SUT VIC AB 3-0 X1 27 (SUTURE) IMPLANT
SUTURE E-PAK OPEN HEART (SUTURE) ×4 IMPLANT
SYR 10ML KIT SKIN ADHESIVE (MISCELLANEOUS) IMPLANT
SYR 20CC LL (SYRINGE) ×4 IMPLANT
SYSTEM SAHARA CHEST DRAIN ATS (WOUND CARE) ×4 IMPLANT
TAPE CLOTH SURG 4X10 WHT LF (GAUZE/BANDAGES/DRESSINGS) ×4 IMPLANT
TAPE PAPER 2X10 WHT MICROPORE (GAUZE/BANDAGES/DRESSINGS) ×4 IMPLANT
TOWEL OR 17X24 6PK STRL BLUE (TOWEL DISPOSABLE) ×8 IMPLANT
TOWEL OR 17X26 10 PK STRL BLUE (TOWEL DISPOSABLE) ×16 IMPLANT
TRAY FOLEY IC TEMP SENS 14FR (CATHETERS) ×4 IMPLANT
TUBE CONNECTING 12'X1/4 (SUCTIONS) ×1
TUBE CONNECTING 12X1/4 (SUCTIONS) ×3 IMPLANT
TUBING INSUFFLATION 10FT LAP (TUBING) IMPLANT
UNDERPAD 30X30 INCONTINENT (UNDERPADS AND DIAPERS) ×4 IMPLANT
VALVE MAGNA EASE 21MM (Prosthesis & Implant Heart) ×4 IMPLANT
VENT LEFT HEART 12002 (CATHETERS) ×4
WATER STERILE IRR 1000ML POUR (IV SOLUTION) ×8 IMPLANT

## 2013-02-14 NOTE — Progress Notes (Signed)
The patient was examined and preop studies reviewed. There has been no change from the prior exam and the patient is ready for surgery.   Plan CABG-AVR on E Lame today

## 2013-02-14 NOTE — Progress Notes (Signed)
02/14/13 extubated per MD orders with Miguel Rota RN. Placed pt on 3L [place]. Pt is stable with sats 96%. Rt will continue to monitor patient.

## 2013-02-14 NOTE — Progress Notes (Addendum)
Mult. Dose vial of fent used for 2 doses, each totaling (entire vial), charted in epic. Unable to waste med in pyxis due to pyxis will not accept 0 as a waste amount. Pharm. Made aware. Agree with above.  Luther Redo, RN

## 2013-02-14 NOTE — Anesthesia Preprocedure Evaluation (Addendum)
Anesthesia Evaluation  Patient identified by MRN, date of birth, ID band Patient awake    Reviewed: Allergy & Precautions, H&P , NPO status , Patient's Chart, lab work & pertinent test results, reviewed documented beta blocker date and time   Airway Mallampati: II TM Distance: >3 FB Neck ROM: Full    Dental  (+) Teeth Intact and Dental Advisory Given   Pulmonary former smoker,  breath sounds clear to auscultation        Cardiovascular hypertension, Pt. on medications + CAD Rhythm:Regular Rate:Normal     Neuro/Psych    GI/Hepatic   Endo/Other    Renal/GU Renal disease     Musculoskeletal   Abdominal   Peds  Hematology   Anesthesia Other Findings   Reproductive/Obstetrics                          Anesthesia Physical Anesthesia Plan  ASA: III  Anesthesia Plan: General   Post-op Pain Management:    Induction: Intravenous  Airway Management Planned: Oral ETT  Additional Equipment: Arterial line, CVP, PA Cath, 3D TEE, TEE and Ultrasound Guidance Line Placement  Intra-op Plan:   Post-operative Plan: Extubation in OR  Informed Consent: I have reviewed the patients History and Physical, chart, labs and discussed the procedure including the risks, benefits and alternatives for the proposed anesthesia with the patient or authorized representative who has indicated his/her understanding and acceptance.   Dental advisory given  Plan Discussed with: CRNA, Anesthesiologist and Surgeon  Anesthesia Plan Comments:         Anesthesia Quick Evaluation

## 2013-02-14 NOTE — Preoperative (Signed)
Beta Blockers   Reason not to administer Beta Blockers:Metoprolol given at 0609 hrs on 02/14/13

## 2013-02-14 NOTE — Anesthesia Postprocedure Evaluation (Signed)
  Anesthesia Post-op Note  Patient: Max Byrd  Procedure(s) Performed: Procedure(s): AORTIC VALVE REPLACEMENT (AVR) (N/A) CORONARY ARTERY BYPASS GRAFTING (CABG) (N/A) INTRAOPERATIVE TRANSESOPHAGEAL ECHOCARDIOGRAM (N/A)  Patient Location: SICU  Anesthesia Type:General  Level of Consciousness: sedated and unresponsive  Airway and Oxygen Therapy: Patient remains intubated per anesthesia plan and Patient placed on Ventilator (see vital sign flow sheet for setting)  Post-op Pain: none  Post-op Assessment: Post-op Vital signs reviewed, Patient's Cardiovascular Status Stable, Respiratory Function Stable and Patent Airway  Post-op Vital Signs: stable  Complications: No apparent anesthesia complications

## 2013-02-14 NOTE — CV Procedure (Signed)
Intra-operative Transesophageal Echocardiography Report:  [name] is a 57 year old male with severe aortic stenosis, two-vessel coronary disease, and normal left and right ventricular function. He is now scheduled to undergo aortic valve replacement and coronary artery bypass grafting by Dr. Donata Clay. Intraoperative transesophageal echocardiography was indicated to evaluate the aortic valve, assess intraoperative volume status, and to evaluate left and right ventricular function.  The patient was brought to the operating room at [facility] and general anesthesia was induced without difficulty. Following  endotracheal intubation and oro-gastric suctioning, the transesophageal echocardiography probe was inserted into the esophagus without difficulty.  Impression: Pre-bypass findings:  1. Aortic valve:  Them aortic valve was bicuspid. There was obvious restriction to opening and calcification of the leaflets. No aortic insufficiency was appreciated. The peak transaortic velocity was 4.65 m/s. with a peak gradient of 86 mm of mercury  and a mean gradient of 42 mm of mercury. The aortic valve area using the VTI method was 0.62 cm. The aortic annulus measured 2.1 cm in the two-dimensional assessment and 2.5 centimeters using a 3-DQ assessment. The ratio of the LVOT/Aortic Valve VTI was 0.2.  2. Mitral valve: There was moderate mitral annular calcification involving the bases of the anterior and posteriorly leaflets. There was normal opening of the mitral valve. There was trace mitral insufficiency.  3. Left ventricle: There was normal left ventricular systolic function. Ejection fraction was estimated at 60%. There was normal wall motion in all segments interrogated. Left ventricular end-diastolic diameter measured 4.9 cm at the mid-papillary level in the transgastric short axis view. Left ventricular wall thickness measured 1.15 cm. This was  consistent with mild to moderate concentric left  ventricular hypertrophy.  4. Right ventricle: The right ventricular size appeared to be within normal limits. There was normal contractility the right ventricular free wall and normal appearing right ventricular function.  6. Tricuspid valve: The tricuspid valve appeared structurally normal with trace tricuspid insufficiency.  7. Interatrial septum: The interatrial septum was intact with no evidence of patent foramen ovale or atrial septal defect by color Doppler or bubble study.  8. Left atrium: There was no thrombus noted in the left atrium or left atrial appendage.  9. Ascending aorta: There was moderate enlargement of the ascending aorta which measured 4.6 cm at the level of the right main pulmonary artery. There was no significant atheromatous disease noted within the wall of the ascending aorta. There was no evidence of aortic dissection.  10. Descending aorta: The descending aorta was nonaneurysmal and measured 2.6 cm in diameter. There was no significant atheromatous disease noted within the descending aorta.  Post-bypass findings:  1. Aortic valve: There was a bioprosthetic valve in the aortic position. The leaflets appeared to open normally. There was no aortic insufficiency. The transaortic mean gradient was 8 mm of mercury consistent with normal function of a bioprosthetic aortic valve.  2. Mitral valve: The mitral valve appeared to change from the pre-bypass study. There was trace mitral insufficiency and moderate mitral annular calcification noted.  3. Left ventricle: There appeared to be normal left ventricular systolic function which was unchanged from the pre-bypass study. Ejection fraction was estimated at 55-60%. There were no regional wall motion abnormalities noted. There was mild to moderate left ventricular hypertrophy which was concentric.  4. Right ventricle: The right ventricular size appeared normal and normal contractility the right ventricular free wall which  appeared unchanged from the pre-bypass study.  5. Tricuspid valve: There was trace tricuspid insufficiency  which appeared unchanged from the pre-bypass study.  Kipp Brood, M.D.

## 2013-02-14 NOTE — Progress Notes (Signed)
Utilization review completed. Ruberta Holck, RN, BSN. 

## 2013-02-14 NOTE — Anesthesia Procedure Notes (Signed)
Procedure Name: Intubation Date/Time: 02/14/2013 7:43 AM Performed by: Charm Barges, Makinzy Cleere R Pre-anesthesia Checklist: Patient identified, Emergency Drugs available, Suction available, Patient being monitored and Timeout performed Patient Re-evaluated:Patient Re-evaluated prior to inductionOxygen Delivery Method: Circle system utilized Preoxygenation: Pre-oxygenation with 100% oxygen Intubation Type: IV induction Ventilation: Mask ventilation without difficulty Laryngoscope Size: Mac and 4 Grade View: Grade II Tube type: Oral Tube size: 8.5 mm Number of attempts: 1 Airway Equipment and Method: Stylet Placement Confirmation: ETT inserted through vocal cords under direct vision and positive ETCO2 Secured at: 23 cm Tube secured with: Tape Dental Injury: Teeth and Oropharynx as per pre-operative assessment

## 2013-02-14 NOTE — Progress Notes (Signed)
  Echocardiogram Echocardiogram Transesophageal (OR) has been performed.  Max Byrd 02/14/2013, 10:35 AM

## 2013-02-14 NOTE — Progress Notes (Signed)
S/p AVR/ CABG  BP 101/64  Pulse 80  Temp(Src) 98.8 F (37.1 C) (Oral)  Resp 17  Ht 5\' 6"  (1.676 m)  Wt 173 lb (78.472 kg)  BMI 27.94 kg/m2  SpO2 100%   Intake/Output Summary (Last 24 hours) at 02/14/13 1814 Last data filed at 02/14/13 1700  Gross per 24 hour  Intake 4951.95 ml  Output   5550 ml  Net -598.05 ml    Being extubated currently  Doing well early postop

## 2013-02-14 NOTE — Brief Op Note (Signed)
02/14/2013  12:31 PM  PATIENT:  Max Byrd  57 y.o. male  PRE-OPERATIVE DIAGNOSIS:  CAD AS  POST-OPERATIVE DIAGNOSIS:  CAD AS  PROCEDURE:  Procedure(s):  AORTIC VALVE REPLACEMENT -21 mm [employer] Ease Pericardial Tissue Valve  CORONARY ARTERY BYPASS GRAFTING x 3 -LIMA to LAD -SVG to L. CIRCUMFLEX -SVG to DIAGONAL  ENDOSCOPIC SAPHENOUS VEIN HARVEST RIGHT LEG  INTRAOPERATIVE TRANSESOPHAGEAL ECHOCARDIOGRAM (N/A)  SURGEON:  Surgeon(s) and Role:    * Kerin Perna, MD - Primary  PHYSICIAN ASSISTANT: Lowella Dandy PA-C  ANESTHESIA:   general  EBL:  Total I/O In: 1000 [I.V.:1000] Out: 400 [Urine:400]  BLOOD ADMINISTERED:1 Unit PRBC, CELLSAVER and PLTS  DRAINS: Left Pleural Chest Tube, Mediastinal Chest Drains   LOCAL MEDICATIONS USED:  NONE  SPECIMEN:  Source of Specimen:  Aortic Valve  DISPOSITION OF SPECIMEN:  PATHOLOGY  COUNTS:  YES  TOURNIQUET:  * No tourniquets in log *  DICTATION: .Dragon Dictation  PLAN OF CARE: Admit to inpatient   PATIENT DISPOSITION:  ICU - intubated and hemodynamically stable.   Delay start of Pharmacological VTE agent (>24hrs) due to surgical blood loss or risk of bleeding: yes

## 2013-02-14 NOTE — Transfer of Care (Signed)
Immediate Anesthesia Transfer of Care Note  Patient: Max Byrd  Procedure(s) Performed: Procedure(s): AORTIC VALVE REPLACEMENT (AVR) (N/A) CORONARY ARTERY BYPASS GRAFTING (CABG) (N/A) INTRAOPERATIVE TRANSESOPHAGEAL ECHOCARDIOGRAM (N/A)  Patient Location: PACU  Anesthesia Type:General  Level of Consciousness: unresponsive and Patient remains intubated per anesthesia plan  Airway & Oxygen Therapy: Patient Spontanous Breathing, Patient remains intubated per anesthesia plan and Patient placed on Ventilator (see vital sign flow sheet for setting)  Post-op Assessment: Report given to PACU RN, Post -op Vital signs reviewed and stable and Patient moving all extremities  Post vital signs: Reviewed and stable  Complications: No apparent anesthesia complications

## 2013-02-15 ENCOUNTER — Encounter (HOSPITAL_COMMUNITY): Payer: Self-pay | Admitting: Cardiothoracic Surgery

## 2013-02-15 ENCOUNTER — Ambulatory Visit: Payer: Federal, State, Local not specified - PPO | Admitting: Cardiothoracic Surgery

## 2013-02-15 ENCOUNTER — Inpatient Hospital Stay (HOSPITAL_COMMUNITY): Payer: Federal, State, Local not specified - PPO

## 2013-02-15 LAB — POCT I-STAT, CHEM 8
BUN: 19 mg/dL (ref 6–23)
Calcium, Ion: 1.22 mmol/L (ref 1.12–1.23)
Chloride: 99 mEq/L (ref 96–112)
Creatinine, Ser: 1.3 mg/dL (ref 0.50–1.35)
Glucose, Bld: 128 mg/dL — ABNORMAL HIGH (ref 70–99)
HCT: 31 % — ABNORMAL LOW (ref 39.0–52.0)
Hemoglobin: 10.5 g/dL — ABNORMAL LOW (ref 13.0–17.0)
Potassium: 4.3 mEq/L (ref 3.7–5.3)
Sodium: 138 mEq/L (ref 137–147)
TCO2: 26 mmol/L (ref 0–100)

## 2013-02-15 LAB — PREPARE PLATELET PHERESIS
Unit division: 0
Unit division: 0

## 2013-02-15 LAB — GLUCOSE, CAPILLARY
Glucose-Capillary: 101 mg/dL — ABNORMAL HIGH (ref 70–99)
Glucose-Capillary: 112 mg/dL — ABNORMAL HIGH (ref 70–99)
Glucose-Capillary: 113 mg/dL — ABNORMAL HIGH (ref 70–99)
Glucose-Capillary: 113 mg/dL — ABNORMAL HIGH (ref 70–99)
Glucose-Capillary: 130 mg/dL — ABNORMAL HIGH (ref 70–99)
Glucose-Capillary: 130 mg/dL — ABNORMAL HIGH (ref 70–99)
Glucose-Capillary: 142 mg/dL — ABNORMAL HIGH (ref 70–99)
Glucose-Capillary: 83 mg/dL (ref 70–99)
Glucose-Capillary: 88 mg/dL (ref 70–99)
Glucose-Capillary: 93 mg/dL (ref 70–99)
Glucose-Capillary: 96 mg/dL (ref 70–99)

## 2013-02-15 LAB — POCT I-STAT 3, ART BLOOD GAS (G3+)
Acid-base deficit: 1 mmol/L (ref 0.0–2.0)
Bicarbonate: 23.8 mEq/L (ref 20.0–24.0)
O2 Saturation: 98 %
Patient temperature: 37.6
TCO2: 25 mmol/L (ref 0–100)
pCO2 arterial: 41.8 mmHg (ref 35.0–45.0)
pH, Arterial: 7.366 (ref 7.350–7.450)
pO2, Arterial: 105 mmHg — ABNORMAL HIGH (ref 80.0–100.0)

## 2013-02-15 LAB — CBC
HCT: 29.8 % — ABNORMAL LOW (ref 39.0–52.0)
HCT: 31.3 % — ABNORMAL LOW (ref 39.0–52.0)
Hemoglobin: 10.8 g/dL — ABNORMAL LOW (ref 13.0–17.0)
MCH: 32.2 pg (ref 26.0–34.0)
MCH: 32 pg (ref 26.0–34.0)
MCHC: 36.2 g/dL — ABNORMAL HIGH (ref 30.0–36.0)
MCHC: 35.5 g/dL (ref 30.0–36.0)
MCV: 89 fL (ref 78.0–100.0)
Platelets: 141 10*3/uL — ABNORMAL LOW (ref 150–400)
RDW: 13.6 % (ref 11.5–15.5)
WBC: 11.4 10*3/uL — ABNORMAL HIGH (ref 4.0–10.5)

## 2013-02-15 LAB — BASIC METABOLIC PANEL
BUN: 14 mg/dL (ref 6–23)
Calcium: 7.5 mg/dL — ABNORMAL LOW (ref 8.4–10.5)
GFR calc non Af Amer: >90 mL/min (ref >90)
Glucose, Bld: 126 mg/dL — ABNORMAL HIGH (ref 70–99)

## 2013-02-15 LAB — CREATININE, SERUM
Creatinine, Ser: 1.29 mg/dL (ref 0.50–1.35)
GFR calc Af Amer: 70 mL/min — ABNORMAL LOW (ref >90)
GFR calc non Af Amer: 60 mL/min — ABNORMAL LOW (ref >90)

## 2013-02-15 LAB — PREPARE FRESH FROZEN PLASMA: Unit division: 0

## 2013-02-15 LAB — MAGNESIUM: Magnesium: 2.4 mg/dL (ref 1.5–2.5)

## 2013-02-15 MED ORDER — INSULIN DETEMIR 100 UNIT/ML ~~LOC~~ SOLN: 6.0000 [IU] | Freq: Every day | SUBCUTANEOUS | Status: DC

## 2013-02-15 MED ORDER — FUROSEMIDE 10 MG/ML IJ SOLN
20.0000 mg | Freq: Two times a day (BID) | INTRAMUSCULAR | Status: AC
  Administered 2013-02-15 (×2): 20 mg via INTRAVENOUS
  Filled 2013-02-15: qty 2

## 2013-02-15 MED ORDER — INSULIN DETEMIR 100 UNIT/ML ~~LOC~~ SOLN
6.0000 [IU] | Freq: Every day | SUBCUTANEOUS | Status: DC
  Administered 2013-02-15 – 2013-02-16 (×2): 6 [IU] via SUBCUTANEOUS
  Filled 2013-02-15 (×2): qty 0.06

## 2013-02-15 MED ORDER — POTASSIUM CHLORIDE CRYS ER 20 MEQ PO TBCR
20.0000 meq | EXTENDED_RELEASE_TABLET | Freq: Once | ORAL | Status: AC
  Administered 2013-02-15: 20 meq via ORAL
  Filled 2013-02-15: qty 1

## 2013-02-15 MED ORDER — FENTANYL 75 MCG/HR TD PT72
75.0000 ug | MEDICATED_PATCH | TRANSDERMAL | Status: DC
  Administered 2013-02-15 – 2013-02-18 (×2): 75 ug via TRANSDERMAL
  Filled 2013-02-15 (×2): qty 1

## 2013-02-15 MED ORDER — FUROSEMIDE 10 MG/ML IJ SOLN: 20.0000 mg | Freq: Two times a day (BID) | INTRAMUSCULAR | Status: DC

## 2013-02-15 MED ORDER — FENTANYL 25 MCG/HR TD PT72
75.0000 ug | MEDICATED_PATCH | TRANSDERMAL | Status: DC
  Filled 2013-02-15: qty 3

## 2013-02-15 MED ORDER — FUROSEMIDE 10 MG/ML IJ SOLN
20.0000 mg | Freq: Two times a day (BID) | INTRAMUSCULAR | Status: DC
  Administered 2013-02-16: 20 mg via INTRAVENOUS
  Filled 2013-02-15: qty 2

## 2013-02-15 MED ORDER — OXYCODONE HCL 5 MG PO TABS
5.0000 mg | ORAL_TABLET | ORAL | Status: DC | PRN
  Administered 2013-02-15 – 2013-02-17 (×3): 5 mg via ORAL
  Filled 2013-02-15 (×3): qty 1

## 2013-02-15 MED ORDER — INSULIN ASPART 100 UNIT/ML ~~LOC~~ SOLN
0.0000 [IU] | SUBCUTANEOUS | Status: DC
  Administered 2013-02-15: 2 [IU] via SUBCUTANEOUS

## 2013-02-15 MED FILL — Electrolyte-R (PH 7.4) Solution: INTRAVENOUS | Qty: 4000 | Status: AC

## 2013-02-15 MED FILL — Potassium Chloride Inj 2 mEq/ML: INTRAVENOUS | Qty: 40 | Status: AC

## 2013-02-15 MED FILL — Magnesium Sulfate Inj 50%: INTRAMUSCULAR | Qty: 10 | Status: AC

## 2013-02-15 MED FILL — Lidocaine HCl IV Inj 20 MG/ML: INTRAVENOUS | Qty: 5 | Status: AC

## 2013-02-15 MED FILL — Sodium Bicarbonate IV Soln 8.4%: INTRAVENOUS | Qty: 50 | Status: AC

## 2013-02-15 MED FILL — Heparin Sodium (Porcine) Inj 1000 Unit/ML: INTRAMUSCULAR | Qty: 10 | Status: AC

## 2013-02-15 MED FILL — Mannitol IV Soln 20%: INTRAVENOUS | Qty: 500 | Status: AC

## 2013-02-15 MED FILL — Sodium Chloride IV Soln 0.9%: INTRAVENOUS | Qty: 3000 | Status: AC

## 2013-02-15 MED FILL — Heparin Sodium (Porcine) Inj 1000 Unit/ML: INTRAMUSCULAR | Qty: 30 | Status: AC

## 2013-02-15 NOTE — Progress Notes (Deleted)
ABG's to Dr. Laneta Simmers.  Orders's rec'd.

## 2013-02-15 NOTE — Care Management Note (Signed)
    Page 1 of 1   02/15/2013     11:52:22 AM   CARE MANAGEMENT NOTE 02/15/2013  Patient:  BERKLEY, CRONKRIGHT   Account Number:  [id number]  Date Initiated:  02/15/2013  Documentation initiated by:  [facility]  Subjective/Objective Assessment:   Post op CABG x3 and AVR.     Action/Plan:   Anticipated DC Date:  02/17/2013   Anticipated DC Plan:  HOME/SELF CARE      DC Planning Services  CM consult      Choice offered to / List presented to:             Status of service:  In process, will continue to follow Medicare Important Message given?   (If response is "NO", the following Medicare IM given date fields will be blank) Date Medicare IM given:   Date Additional Medicare IM given:    Discharge Disposition:    Per UR Regulation:  Reviewed for med. necessity/level of care/duration of stay  If discussed at Long Length of Stay Meetings, dates discussed:    Comments:  ContactBrodie, Correll [phone]   [phone]  02-15-13 11:50am Avie Arenas, RNBSN [phone] Post op progressing.  Plan for discharge is home with wife. No needs identified at this time. CM will continue to follow.

## 2013-02-15 NOTE — Progress Notes (Signed)
Dr Donata Clay updated at bedside. Updated MT x1 d/c. Updated friction rub noted on auscultation, breath sounds clear, on room air. MD assessed pt. MD updated ambulated in hall, sat up in chair, tolerated well. Will continue to monitor. Max Byrd

## 2013-02-15 NOTE — Progress Notes (Signed)
Anesthesiology Follow-up:  POD # 1 AVR/CABGX3  Awake and alert, neuro intact, having some incisional pain. Hemodynamically stable.  VS: T- 37.5 BP- 112/61 HR -78 (SR) RR- 15 O2 Sat 100% PA 23/9 CO/CI 5.0/2.7   K-4.2 BUN/Cr.-14/0.87 H/H: 10.8/29.8 Plts; 135,000  Extubated 3.5 hours post-op.   Doing well so far, no apparent complications.

## 2013-02-15 NOTE — Progress Notes (Addendum)
[facility] DAILY ICU PROGRESS NOTE                   [address]            [place] [id number]          [phone]   1 Day Post-Op Procedure(s) (LRB): AORTIC VALVE REPLACEMENT (AVR) (N/A) CORONARY ARTERY BYPASS GRAFTING (CABG) (N/A) INTRAOPERATIVE TRANSESOPHAGEAL ECHOCARDIOGRAM (N/A)  Total Length of Stay:  LOS: 1 day   Subjective: Patient with a fair amount of incisional pain this am.  Objective: Vital signs in last 24 hours: Temp:  [96.6 F (35.9 C)-99.9 F (37.7 C)] 99.5 F (37.5 C) (12/31 0700) Pulse Rate:  [78-90] 78 (12/31 0700) Cardiac Rhythm:  [-] Heart block (12/31 0300) Resp:  [10-24] 15 (12/31 0700) BP: (101-120)/(61-91) 112/61 mmHg (12/31 0700) SpO2:  [96 %-100 %] 100 % (12/31 0700) Arterial Line BP: (83-133)/(52-102) 121/102 mmHg (12/31 0700) FiO2 (%):  [40 %-50 %] 40 % (12/30 1732) Weight:  [78.472 kg (173 lb)-79.2 kg (174 lb 9.7 oz)] 79.2 kg (174 lb 9.7 oz) (12/31 0500)  Filed Weights   02/13/13 1413 02/14/13 1015 02/15/13 0500  Weight: 78.6 kg (173 lb 4.5 oz) 78.472 kg (173 lb) 79.2 kg (174 lb 9.7 oz)    Weight change: -0.128 kg (-4.5 oz)   Hemodynamic parameters for last 24 hours: PAP: (20-31)/(9-21) 23/9 mmHg CO:  [4 L/min-5.5 L/min] 5 L/min CI:  [2.1 L/min/m2-2.9 L/min/m2] 2.7 L/min/m2  Intake/Output from previous day: 12/30 0701 - 12/31 0700 In: 6360.9 [P.O.:120; I.V.:3393.9; Blood:1497; IV Piggyback:1350] Out: 7900 [Urine:5600; Blood:1750; Chest Tube:550]     Current Meds: Scheduled Meds: . acetaminophen  1,000 mg Oral Q6H   Or  . acetaminophen (TYLENOL) oral liquid 160 mg/5 mL  1,000 mg Per Tube Q6H  . aspirin EC  81 mg Oral Daily  . bisacodyl  10 mg Oral Daily   Or  . bisacodyl  10 mg Rectal Daily  . carisoprodol  350 mg Oral BID  . cefUROXime (ZINACEF)  IV  1.5 g Intravenous Q12H  . diclofenac  50 mg Oral BID   And  . misoprostol  200 mcg Oral BID  . docusate sodium  200 mg Oral Daily  . DULoxetine  30 mg Oral BID   . escitalopram  10 mg Oral BID  . famotidine (PEPCID) IV  20 mg Intravenous Q12H  . hyoscyamine  0.375 mg Oral BID  . insulin aspart  0-24 Units Subcutaneous Q4H  . metoprolol tartrate  12.5 mg Oral BID   Or  . metoprolol tartrate  12.5 mg Per Tube BID  . OxyCODONE  30 mg Oral Q8H  . [START ON 02/16/2013] pantoprazole  40 mg Oral Daily  . sodium chloride  3 mL Intravenous Q12H  . vancomycin  1,000 mg Intravenous Q12H   Continuous Infusions: . sodium chloride 20 mL/hr at 02/15/13 0600  . sodium chloride    . sodium chloride 250 mL (02/15/13 0612)  . dexmedetomidine Stopped (02/14/13 1800)  . DOPamine 2.514 mcg/kg/min (02/15/13 0600)  . lactated ringers 20 mL/hr at 02/15/13 0600  . nitroGLYCERIN Stopped (02/14/13 1500)  . phenylephrine (NEO-SYNEPHRINE) Adult infusion Stopped (02/15/13 0610)   PRN Meds:.albumin human, fentaNYL, metoprolol, ondansetron (ZOFRAN) IV, oxyCODONE, sodium chloride  General appearance: alert, cooperative and no distress Neurologic: intact Heart: regular rate and rhythm, no murmur, rub with chest tubes in place Lungs: Diminished at bases Abdomen: soft, non-tender; bowel sounds normal; no masses,  no organomegaly Extremities: RLE dressing is clean and dry, mild LE edema Wound: Sternal wound dressing intact  Lab Results: CBC: Recent Labs  02/14/13 2045 02/14/13 2051 02/15/13 0420  WBC 12.0*  --  11.4*  HGB 11.1* 10.2* 10.8*  HCT 30.0* 30.0* 29.8*  PLT 152  --  135*   BMET:  Recent Labs  02/14/13 2051 02/15/13 0420  NA 139 140  K 4.1 4.2  CL 102 104  CO2  --  26  GLUCOSE 176* 126*  BUN 15 14  CREATININE 0.80 0.87  CALCIUM  --  7.5*    PT/INR:  Recent Labs  02/14/13 1440  LABPROT 16.2*  INR 1.33   Radiology: Dg Chest Portable 1 View  02/14/2013   CLINICAL DATA:  Postop CABG.  EXAM: PORTABLE CHEST - 1 VIEW  COMPARISON:  02/10/2013  FINDINGS: Changes of CABG and valve replacement. Swan-Ganz catheter is in the central right lower lobe  pulmonary artery. Right internal jugular central line tip is in the SVC. Left chest tube is in place without pneumothorax. Endotracheal tube is approximately 5 cm above the carina. NG tube enters the stomach.  Minimal left basilar atelectasis. No pneumothorax. No effusions. Right lung is clear.  IMPRESSION: Postoperative changes with lingular atelectasis.  No pneumothorax.  Support devices in expected position.   Electronically Signed   By: Charlett Nose M.D.   On: 02/14/2013 15:09     Assessment/Plan: S/P Procedure(s) (LRB): AORTIC VALVE REPLACEMENT (AVR) (N/A) CORONARY ARTERY BYPASS GRAFTING (CABG) (N/A) INTRAOPERATIVE TRANSESOPHAGEAL ECHOCARDIOGRAM (N/A)  1.CV-EKG this am shows LBBB (present prior to surgery). SR in the 70's this am. On Dopamine gttp-wean as tolerates. Lopressor 12.5 bid with parameters scheduled. 2.Pulmonary-Chest tubes with 550 cc since surgery. CXR appears to show patient rotated to the right, no pneumothorax, cardiomegaly, left base atelectasis. Will remove posterior mediastinal tube only today. 3.Volume overload-begin diuresis 4.ABL anemia-H and H 108 and 29.8 5.Mild thrombocytopenia-platelets 135,000 6.CBGs 88/113/113. Pre op HGA1C 5. Will start low dose Levemir to keep off insulin drip. Will stop accu checks 24 hours after transfer. 7. Per Dr. Donata Clay, will order Fentanyl patch to help control pain. He has a history of chronic pain and takes Oxy three times daily as well as NSAIDS. 8.Please see progression orders    ZIMMERMAN,DONIELLE M PA-C 02/15/2013 8:03 AM   patient examined and medical record reviewed,agree with above note. Will mobilize OOB to chair Pain management will be a challenge due to longstanding chronic pain treated at pain clinic VAN TRIGT III,Dalayah Deahl 02/15/2013

## 2013-02-16 ENCOUNTER — Inpatient Hospital Stay (HOSPITAL_COMMUNITY): Payer: Federal, State, Local not specified - PPO

## 2013-02-16 DIAGNOSIS — I209 Angina pectoris, unspecified: Secondary | ICD-10-CM

## 2013-02-16 HISTORY — DX: Angina pectoris, unspecified: I20.9

## 2013-02-16 LAB — BASIC METABOLIC PANEL
BUN: 25 mg/dL — ABNORMAL HIGH (ref 6–23)
CO2: 23 mEq/L (ref 19–32)
Calcium: 8.2 mg/dL — ABNORMAL LOW (ref 8.4–10.5)
Chloride: 102 mEq/L (ref 96–112)
Creatinine, Ser: 1.4 mg/dL — ABNORMAL HIGH (ref 0.50–1.35)
GFR calc Af Amer: 63 mL/min — ABNORMAL LOW (ref >90)
GFR calc non Af Amer: 54 mL/min — ABNORMAL LOW (ref >90)
Glucose, Bld: 104 mg/dL — ABNORMAL HIGH (ref 70–99)
Potassium: 4.4 mEq/L (ref 3.7–5.3)
Sodium: 139 mEq/L (ref 137–147)

## 2013-02-16 LAB — CBC
HCT: 28.5 % — ABNORMAL LOW (ref 39.0–52.0)
Hemoglobin: 10.4 g/dL — ABNORMAL LOW (ref 13.0–17.0)
MCH: 32.9 pg (ref 26.0–34.0)
MCHC: 36.5 g/dL — ABNORMAL HIGH (ref 30.0–36.0)
MCV: 90.2 fL (ref 78.0–100.0)
Platelets: 123 10*3/uL — ABNORMAL LOW (ref 150–400)
RBC: 3.16 MIL/uL — ABNORMAL LOW (ref 4.22–5.81)
RDW: 13.5 % (ref 11.5–15.5)
WBC: 11.3 10*3/uL — ABNORMAL HIGH (ref 4.0–10.5)

## 2013-02-16 LAB — GLUCOSE, CAPILLARY
Glucose-Capillary: 95 mg/dL (ref 70–99)
Glucose-Capillary: 92 mg/dL (ref 70–99)

## 2013-02-16 MED ORDER — BISACODYL 5 MG PO TBEC: 10.0000 mg | DELAYED_RELEASE_TABLET | Freq: Every day | ORAL | Status: DC | PRN

## 2013-02-16 MED ORDER — SODIUM CHLORIDE 0.9 % IV SOLN: 250.0000 mL | INTRAVENOUS | Status: DC | PRN

## 2013-02-16 MED ORDER — MIDAZOLAM HCL 2 MG/2ML IJ SOLN
2.0000 mg | Freq: Once | INTRAMUSCULAR | Status: AC
  Administered 2013-02-16: 2 mg via INTRAVENOUS
  Filled 2013-02-16: qty 2

## 2013-02-16 MED ORDER — METOPROLOL TARTRATE 12.5 MG HALF TABLET
12.5000 mg | ORAL_TABLET | Freq: Two times a day (BID) | ORAL | Status: DC
Start: 2013-02-16 — End: 2013-02-18
  Administered 2013-02-16 – 2013-02-17 (×3): 12.5 mg via ORAL
  Filled 2013-02-16 (×5): qty 1

## 2013-02-16 MED ORDER — SODIUM CHLORIDE 0.9 % IJ SOLN
10.0000 mL | INTRAMUSCULAR | Status: DC | PRN
  Administered 2013-02-17: 10 mL
  Administered 2013-02-17: 20 mL

## 2013-02-16 MED ORDER — BISACODYL 10 MG RE SUPP: 10.0000 mg | Freq: Every day | RECTAL | Status: DC | PRN

## 2013-02-16 MED ORDER — DOCUSATE SODIUM 100 MG PO CAPS
200.0000 mg | ORAL_CAPSULE | Freq: Every day | ORAL | Status: DC
  Administered 2013-02-18: 200 mg via ORAL
  Filled 2013-02-16 (×2): qty 2

## 2013-02-16 MED ORDER — MOVING RIGHT ALONG BOOK
Freq: Once | Status: AC
  Administered 2013-02-16: 12:00:00
  Filled 2013-02-16: qty 1

## 2013-02-16 MED ORDER — SODIUM CHLORIDE 0.9 % IJ SOLN: 3.0000 mL | Freq: Two times a day (BID) | INTRAMUSCULAR | Status: DC

## 2013-02-16 MED ORDER — OXYCODONE HCL ER 15 MG PO T12A
30.0000 mg | EXTENDED_RELEASE_TABLET | Freq: Two times a day (BID) | ORAL | Status: DC
  Administered 2013-02-16 – 2013-02-18 (×4): 30 mg via ORAL
  Filled 2013-02-16 (×4): qty 2

## 2013-02-16 MED ORDER — SODIUM CHLORIDE 0.9 % IJ SOLN: 10.0000 mL | Freq: Two times a day (BID) | INTRAMUSCULAR | Status: DC

## 2013-02-16 MED ORDER — SODIUM CHLORIDE 0.9 % IJ SOLN: 3.0000 mL | INTRAMUSCULAR | Status: DC | PRN

## 2013-02-16 NOTE — Progress Notes (Signed)
2 Days Post-Op Procedure(s) (LRB): AORTIC VALVE REPLACEMENT (AVR) (N/A) CORONARY ARTERY BYPASS GRAFTING (CABG) (N/A) INTRAOPERATIVE TRANSESOPHAGEAL ECHOCARDIOGRAM (N/A) Subjective: Sore but feeling better overall   Objective: Vital signs in last 24 hours: Temp:  [97.4 F (36.3 C)-99.5 F (37.5 C)] 98 F (36.7 C) (01/01 0819) Pulse Rate:  [69-85] 75 (01/01 1000) Cardiac Rhythm:  [-] Normal sinus rhythm (01/01 0700) Resp:  [11-28] 11 (01/01 1000) BP: (107-155)/(77-103) 108/82 mmHg (01/01 1000) SpO2:  [88 %-100 %] 91 % (01/01 1000) Arterial Line BP: (102-135)/(81-98) 135/95 mmHg (12/31 1130) Weight:  [78.4 kg (172 lb 13.5 oz)] 78.4 kg (172 lb 13.5 oz) (01/01 0200)  Hemodynamic parameters for last 24 hours: PAP: (42-50)/(25-28) 50/28 mmHg  Intake/Output from previous day: 12/31 0701 - 01/01 0700 In: 1506.6 [P.O.:460; I.V.:546.6; IV Piggyback:500] Out: 1525 [Urine:1145; Chest Tube:380] Intake/Output this shift: Total I/O In: 330 [P.O.:240; I.V.:40; IV Piggyback:50] Out: 60 [Urine:60]  General appearance: alert and cooperative Heart: regular rate and rhythm, S1, S2 normal, no murmur, click, rub or gallop Lungs: clear to auscultation bilaterally Extremities: extremities normal, atraumatic, no cyanosis or edema Wound: dressing dry  Lab Results:  Recent Labs  02/15/13 1710 02/15/13 1713 02/16/13 0450  WBC 13.5*  --  11.3*  HGB 11.1* 10.5* 10.4*  HCT 31.3* 31.0* 28.5*  PLT 141*  --  123*   BMET:  Recent Labs  02/15/13 0420  02/15/13 1713 02/16/13 0450  NA 140  --  138 139  K 4.2  --  4.3 4.4  CL 104  --  99 102  CO2 26  --   --  23  GLUCOSE 126*  --  128* 104*  BUN 14  --  19 25*  CREATININE 0.87  < > 1.30 1.40*  CALCIUM 7.5*  --   --  8.2*  < > = values in this interval not displayed.  PT/INR:  Recent Labs  02/14/13 1440  LABPROT 16.2*  INR 1.33   ABG    Component Value Date/Time   PHART 7.366 02/15/2013 0500   HCO3 23.8 02/15/2013 0500   TCO2 26  02/15/2013 1713   ACIDBASEDEF 1.0 02/15/2013 0500   O2SAT 98.0 02/15/2013 0500   CBG (last 3)   Recent Labs  02/15/13 2353 02/16/13 0430 02/16/13 0749  GLUCAP 83 95 92   CXR: clear  Assessment/Plan: S/P Procedure(s) (LRB): AORTIC VALVE REPLACEMENT (AVR) (N/A) CORONARY ARTERY BYPASS GRAFTING (CABG) (N/A) INTRAOPERATIVE TRANSESOPHAGEAL ECHOCARDIOGRAM (N/A) He is doing well overall Creat up slightly and his weight is at preop so will stop diuretics. Remove chest tubes. Mobilize Plan for transfer to step-down: see transfer orders   LOS: 2 days    Max Byrd K 02/16/2013

## 2013-02-16 NOTE — Op Note (Signed)
NAMEBartholomew, Ramesh Patric                   ACCOUNT NO.:  [id number]  MEDICAL RECORD NO.:  [id number]  LOCATION:  2S09C                        FACILITY:  [facility]  PHYSICIAN:  Ivin Poot, M.D.  DATE OF BIRTH:  [birth year]  DATE OF PROCEDURE:  02/14/2013 DATE OF DISCHARGE:                              OPERATIVE REPORT   OPERATION: 1. Aortic valve replacement for severe bicuspid aortic stenosis with a     21-mm pericardial tissue valve (serial #[id number]) Edwards model 3300     TFX. 2. Coronary artery bypass grafting x3 (left internal mammary artery to     left anterior descending, saphenous vein graft to diagonal,     saphenous vein graft to circumflex marginal). 3.  Endoscopic     harvest of right leg greater saphenous vein.  SURGEON:  Ivin Poot, MD.  ASSISTANT:  Providence Crosby PA-C.  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  ANESTHESIA:  General by Glynda Jaeger, MD.  CLINICAL NOTE:  The patient is a 58 year old Caucasian male, with known bicuspid aortic valve disease followed with serial 2D echoes.  Until recently, he did not have significant aortic stenosis.  However, recently developed exertional symptoms of chest discomfort, and shortness of breath with decrease in exercise tolerance, and at the same time a 2D echocardiogram demonstrated significant increase in transvalvular gradient with a calculated aortic valve area less than 1.0.  Left and right heart cardiac catheterization demonstrated severe multivessel CAD with a left dominant circulation, 90% LAD diagonal stenoses, proximal 80% circumflex stenosis, and diffuse disease of a small nondominant RCA.  His right heart pressures were normal, and cardiac index was greater than 2.0.   Based on his symptoms, coronary anatomy and echo, confirmation of the aortic stenosis is recommended to have combined AVR CABG.  Prior to surgery, I examined the patient several occasions in the office reviewing results of his cardiac studies.  We discussed the procedure of combined AVR with CABG at length.  Due to the patient's history of significant GI bleeding associated with nonsteroidal anti-inflammatory medication, he was strongly opposed to a mechanical valve and Coumadin anticoagulation.  He understood that a tissue valve implanted at age less than 60 may result in tissue valve degeneration with subsequent need for replacement in the future.  We discussed his significant CAD and the planned procedure for surgical revascularization of at least 3 coronary vessels.  We discussed the  major aspects of this procedure including use of general anesthesia, location of the sternal leg incisions, and the expected postoperative recovery.  We discussed the impact of his preoperative chronic pain syndrome, postoperative pain superimposed on his chronic long-standing pain.  We discussed the potential risks in addition, including risks of MI, bleeding, blood transfusion requirement, infection, stroke, pleural effusion, cardiac arrhythmia, pacemaker requirement, and death.  After reviewing these issues, he demonstrated his understanding and agreed to proceed with surgery under what I felt was an informed consent.  OPERATIVE FINDINGS: 1. 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 2. Intraoperative coagulopathy with significant thrombocytopenia     requiring platelet transfusion. 3. Normal LV systolic function following a separation from     cardiopulmonary bypass in a sinus rhythm.  OPERATIVE PROCEDURE:  The patient was brought to the operating room  and placed supine on the operating table where general anesthesia was induced under invasive hemodynamic monitoring.  A transesophageal echo probe was placed by the anesthesiologist which confirmed the preoperative diagnosis.  The patient was prepped and draped as a sterile field.  A proper time-out was performed.  A sternal incision was made as the saphenous vein was harvested endoscopically from the right leg.  The left internal mammary artery was harvested as a pedicle graft from its origin at the subclavian vessels.  It was a small 1 mm valve.  There was significant bleeding from the chest wall during the mammary taken down, which was more than expected.  The mammary was irrigated with papaverine and placed in a soaked papaverine sponge and placed in the left pleural space.  A sternal retractor was placed and the pericardium was opened and suspended.  Pursestrings were placed in the ascending aorta and right atrium after the right saphenous vein had been harvested.  Full heparin dose was administered and the ACT was documented as being therapeutic. The patient was then cannulated in the aorta and right atrium and placed on cardiopulmonary bypass.  A LV vent was placed via the right superior pulmonary vein.  The coronary arteries were identified for grafting. The LAD and diagonal were small vessels but were graftable.  The right coronary was less than 1 mm nondominant vessel too small to graft.  The OM1 had heavy calcification proximally with a stenosis noted on the proximal circumflex at cath and was an adequate vessel for grafting but intramyocardial.  The distal circumflex was small in the distribution of the posterior descending.  Cardioplegia cannulas were then placed for both antegrade and retrograde cold blood cardioplegia.  The patient was cooled to 32 degrees.  The mammary artery and veins were prepared for the distal anastomoses.  The crossclamp was applied.  Cardioplegic  hypothermic arrest was achieved with cold blood cardioplegia delivered both the aortic root and coronary sinus to resolve hypothermic cardioplegic arrest.  The septal temperature dropped less than 14 degrees.  Cardioplegia was delivered every 20 minutes or less while the crossclamp was in place.  Inspection of the ventricle prior to surgery demonstrated no significant scarring but some hypertrophy.  The distal coronary anastomoses were then performed.  The first distal anastomosis was at the OM branch of the circumflex. This was intramyocardial.  It was 1.75 mm vessel.  There was a proximal 80% stenosis.  A reverse saphenous vein was sewn end-to-side with running 7-0 Prolene with excellent flow through the graft.  Cardioplegia was redosed.  The second distal anastomosis was to the diagonal branch to LAD.  This was a 1.3 mm vessel with proximal 80-90% stenosis.  A reverse saphenous vein was sewn end-to-side with running 7-0 Prolene with good flow through the graft.  Cardioplegia was redosed.  A third distal anastomosis was to the distal portion of the LAD.  It was a small vessel 1.4 mm in diameter.  There was some distal disease as well though the vessel was very small.  The  left IMA pedicle was brought through an opening in the left lateral pericardium, was brought down onto the LAD and sewn end-to-side with running 8-0 Prolene.  There was excellent flow through the anastomosis after briefly releasing the pedicle bulldog on the mammary artery.  The mammary vessel was 1.2 mm in diameter.  The pedicle was secured at the epicardium and the mammary bulldog was reapplied to the pedicle.  Cardioplegia was redosed.  Attention was then directed to the aortic valve.  A transverse aortotomy was performed.  The diameter of the ascending aorta was 4.4 cm.  The aortic valve was inspected.  It was bicuspid with fusion of the left and right coronary cusp.  There was heavy calcification.  The  valve was excised and the anulus was debrided of calcium.  Some of the calcium extended onto the anterior leaflet of the mitral valve which was also debrided.  The outflow tract was irrigated with copious amounts of saline.  The anulus was sized to a 21-mm sizer.  A 23-mm sizer would not fit the anulus at all.  Subannular 2-0 Ethibond pledgeted sutures were then placed around the anulus numbering 15 total.  The valve was prepared according to protocol.  The sutures were placed through the sewing ring of the valve. The valve was seated and the sutures were tied.  There was excellent confirmation of the valve to the anulus.  There was no evidence of spaces between the sutures which would result in a perivalvular leak. Both coronary ostium were widely patent.  During the valve implantation, cardioplegia was delivered via the coronary sinus catheter as needed.  The valve was inspected and found to be in good orientation, and the aortotomy was closed in layers using running 4-0 Prolene in 2 layers. Cardioplegia was redosed.  While the crossclamp was still in place, 2 proximal vein anastomoses were placed on the ascending aorta using a 4.0 mm punch running 6-0 Prolene.  Prior to releasing the cross-clamp and tying down the final proximal anastomosis, air was vented from the coronaries with a dose of retrograde warm blood cardioplegia.  The cross-clamp was then removed.  The heart was cardioverted back to a regular rhythm.  The vein grafts were de-aired and opened, each had excellent flow.  The aortotomy, proximal and distal coronary anastomoses were checked and found to be hemostatic.  The cardioplegia cannula was removed, and the LV vent was removed.  Temporary pacing wires were applied and the patient was rewarmed to 37 degrees.  When the patient was rewarmed adequately, the lungs re-expanded and the ventilator was resumed.  It was clear, there was some diffuse coagulopathy, and the  platelet count returned low at 75- 80,000.  The patient was hemodynamically stable on renal dose dopamine. He had adequate volume and was easily separated from cardiopulmonary bypass.  The transesophageal echo showed preservation of normal global LV function with a normally functioning aortic tissue valve without AI or gradient across the valve.  Protamine was administered without adverse reaction.  The cannulas were removed.  There was still diffuse coagulopathy, so platelet transfusion as well as 1 FFP were administered.  This improved coagulation function.  Considerable time was spent drying up the diffuse areas of bleeding from the chest wall, mediastinum, and sternum.  Ultimately, the superior pericardium was closed over the aorta and vein grafts.  Anterior and posterior mediastinal tubes and left pleural tube were placed and brought out through separate incisions.  The sternum was then closed with interrupted  steel wire.  The pectoralis fascia was closed with a running #1 Vicryl.  The subcutaneous and skin layers were closed in running Vicryl and sterile dressings were applied.  Total cardiopulmonary bypass time was 175 minutes.  The patient returned to the ICU in stable condition.     Ivin Poot, M.D.     PV/MEDQ  D:  02/15/2013  T:  02/16/2013  Job:  [id number]  cc:   Wenda Low, MD Sigmund I. Gaynelle Arabian, M.D. Belva Crome, M.D.

## 2013-02-16 NOTE — Progress Notes (Signed)
Transferred to 2W22 via [place] and monitor,  Placed in bed on tele box, SCD's with pt. RN to receive in room

## 2013-02-16 NOTE — Progress Notes (Signed)
Report called to 2W RN 

## 2013-02-16 NOTE — Progress Notes (Signed)
Attempted to call report to 2W 

## 2013-02-17 ENCOUNTER — Inpatient Hospital Stay (HOSPITAL_COMMUNITY): Payer: Federal, State, Local not specified - PPO

## 2013-02-17 LAB — BASIC METABOLIC PANEL
BUN: 30 mg/dL — ABNORMAL HIGH (ref 6–23)
CO2: 23 meq/L (ref 19–32)
Calcium: 8 mg/dL — ABNORMAL LOW (ref 8.4–10.5)
Chloride: 101 mEq/L (ref 96–112)
Creatinine, Ser: 1.1 mg/dL (ref 0.50–1.35)
GFR calc Af Amer: 84 mL/min — ABNORMAL LOW (ref >90)
GFR, EST NON AFRICAN AMERICAN: 73 mL/min — AB (ref >90)
GLUCOSE: 84 mg/dL (ref 70–99)
POTASSIUM: 4.2 meq/L (ref 3.7–5.3)
Sodium: 139 mEq/L (ref 137–147)

## 2013-02-17 LAB — CBC
HCT: 26.6 % — ABNORMAL LOW (ref 39.0–52.0)
Hemoglobin: 9.6 g/dL — ABNORMAL LOW (ref 13.0–17.0)
MCH: 32.3 pg (ref 26.0–34.0)
MCHC: 36.1 g/dL — ABNORMAL HIGH (ref 30.0–36.0)
MCV: 89.6 fL (ref 78.0–100.0)
Platelets: 109 10*3/uL — ABNORMAL LOW (ref 150–400)
RBC: 2.97 MIL/uL — ABNORMAL LOW (ref 4.22–5.81)
RDW: 13.5 % (ref 11.5–15.5)
WBC: 7.9 10*3/uL (ref 4.0–10.5)

## 2013-02-17 NOTE — Discharge Instructions (Signed)
Activity: 1.May walk up steps °               2.No lifting more than ten pounds for four weeks.  °               3.No driving for four weeks. °               4.Stop any activity that causes chest pain, shortness of breath, dizziness, sweating or excessive weakness. °               5.Avoid straining. °               6.Continue with your breathing exercises daily. ° °Diet: Low fat, Low salt diet ° °Wound Care: May shower.  Clean wounds with mild soap and water daily. Contact the office at [phone] if any problems arise. ° °Coronary Artery Bypass Grafting, Care After °Refer to this sheet in the next few weeks. These instructions provide you with information on caring for yourself after your procedure. Your health care provider may also give you more specific instructions. Your treatment has been planned according to current medical practices, but problems sometimes occur. Call your health care provider if you have any problems or questions after your procedure. °WHAT TO EXPECT AFTER THE PROCEDURE °Recovery from surgery will be different for everyone. Some people feel well after 3 or 4 weeks, while for others it takes longer. After your procedure, it is typical to have the following: °· Nausea and a lack of appetite.   °· Constipation. °· Weakness and fatigue.   °· Depression or irritability.   °· Pain or discomfort at your incision site. °HOME CARE INSTRUCTIONS °· Only take over-the-counter or prescription medicines as directed by your health care provider. Take all medicines exactly as directed. Do not stop taking medicines or start any new medicines without first checking with your health care provider.   °· Take your pulse as directed by your health care provider. °· Perform deep breathing as directed by your health care provider. If you were given a device called an incentive spirometer, use it to practice deep breathing several times a day. Support your chest with a pillow or your arms when you take deep  breaths or cough. °· Keep incision areas clean, dry, and protected. Remove or change any bandages (dressings) only as directed by your health care provider. You may have skin adhesive strips over the incision areas. Do not take the strips off. They will fall off on their own. °· Check incision areas daily for any swelling, redness, or drainage. °· If incisions were made in your legs, do the following: °· Avoid crossing your legs.   °· Avoid sitting for long periods of time. Change positions every 30 minutes.   °· Elevate your legs when you are sitting.   °· Wear compression stockings as directed by your health care provider. These stockings help keep blood clots from forming in your legs. °· Take showers once your health care provider approves. Until then, only take sponge baths. Pat incisions dry. Do not rub incisions with a washcloth or towel. Do not take tub baths or go swimming until your health care provider approves. °· Eat foods that are high in fiber, such as raw fruits and vegetables, whole grains, beans, and nuts. Meats should be lean cut. Avoid canned, processed, and fried foods. °· Drink enough fluids to keep your urine clear or pale yellow. °· Weigh yourself every day. This helps identify if you are retaining fluid that   may make your heart and lungs work harder.   Rest and limit activity as directed by your health care provider. You may be instructed to:  Stop any activity at once if you have chest pain, shortness of breath, irregular heartbeats, or dizziness. Get help right away if you have any of these symptoms.  Move around frequently for short periods or take short walks as directed by your health care provider. Increase your activities gradually. You may need physical therapy or cardiac rehabilitation to help strengthen your muscles and build your endurance.  Avoid lifting, pushing, or pulling anything heavier than 10 lb (4.5 kg) for at least 6 weeks after surgery.  Do not drive until  your health care provider approves.  Ask your health care provider when you may return to work and resume sexual activity.  Follow up with your health care provider as directed.  SEEK MEDICAL CARE IF:  You have swelling, redness, increasing pain, or drainage at the site of an incision.   You develop a fever.   You have swelling in your ankles or legs.   You have pain in your legs.   You have weight gain of 2 or more pounds a day.  You are nauseous or vomit.  You have diarrhea. SEEK IMMEDIATE MEDICAL CARE IF:  You have chest pain that goes to your jaw or arms.  You have shortness of breath.   You have a fast or irregular heartbeat.   You notice a "clicking" in your breastbone (sternum) when you move.   You have numbness or weakness in your arms or legs.  You feel dizzy or lightheaded.  MAKE SURE YOU:  Understand these instructions.  Will watch your condition.  Will get help right away if you are not doing well or get worse. Document Released: 08/22/2004 Document Revised: 10/05/2012 Document Reviewed: 07/12/2012 [facility] Patient Information 2014 [place].  Aortic Valve Replacement Care After Refer to this sheet in the next few weeks. These instructions provide you with information on caring for yourself after your procedure. Your caregiver may also give you specific instructions. Your treatment has been planned according to current medical practices, but problems sometimes occur. Call your caregiver if you have any problems or questions after your procedure. HOME CARE INSTRUCTIONS   Only take over-the-counter or prescription medicines as directed by your caregiver.  Take your temperature every morning for the first 7 days after surgery. Write these down. Call your caregiver if your temperature stays above 100 F (37.8 C) for more than a day.   Weigh yourself every morning for at least 7 days after surgery. Write your weight down to monitor any  weight increase.  Wear elastic stockings during the day for at least 2 weeks after surgery. Use them longer if your ankles are swollen. The stockings help blood flow and help reduce swelling in the legs.  Take frequent naps or rest often throughout the day.  Avoid lifting over 10 lbs (4.5 kg) or pushing or pulling things with your arms for 6 8 weeks or as directed by your caregiver.  Avoid driving or airplane travel for 4 6 weeks after surgery or as directed. If you are riding in a car for an extended period, stop every 1 2 hours to stretch your legs. Keep a record of your medicines and medical history with you when traveling.  Do not cross your legs.  Do not take baths for 4 6 weeks after surgery. Take showers once your caregiver approves.  Pat incisions dry. Do not rub incisions with a washcloth or towel. °· Avoid climbing stairs and using the handrail to pull yourself up for the first 2 3 weeks after surgery. °· Return to work as directed by your caregiver. °· Drink enough fluids to keep your urine clear or pale yellow. °· Do not strain to have a bowel movement. Eat high-fiber foods if you become constipated. You may also take a medicine to help you have a bowel movement (laxative) as directed by your caregiver. °· Resume sexual activity as directed by your caregiver. Men should not use medicines for erectile dysfunction until their doctor says it is okay. °· If you had a certain type of heart condition in the past, you may need to take antibiotic medicine before having dental work or surgery. Let your dentist and caregivers know if you had one or more of the following: °· Previous endocarditis. °· An artificial (prosthetic) heart valve. °· Congenital heart disease. °SEEK MEDICAL CARE IF: °· You develop a skin rash.   °· Your weight is increasing each day over 2 3 days, or you have a sudden weight gain. °· Your weight increases by 2 or more pounds (1 kg or more) in a single day. °SEEK IMMEDIATE  MEDICAL CARE IF:  °· You develop chest pain that is not coming from your incision.   °· You develop shortness of breath or have difficulty breathing.   °· You have a fever.   °· You have increased bleeding from your wounds.   °· You have increasing wound pain.   °· You have redness or swelling around your wounds °· You have pus coming from your wound.   °· You develop lightheadedness.   °MAKE SURE YOU:  °· Understand these directions. °· Will watch your condition. °· Will get help right away if you are not doing well or get worse. °Document Released: 08/21/2004 Document Revised: 01/20/2012 Document Reviewed: 11/17/2011 °[employer]® Patient Information ©2014 [employer]. ° ° °

## 2013-02-17 NOTE — Progress Notes (Signed)
CARDIAC REHAB PHASE I   PRE:  Rate/Rhythm: 79 SR  BP:  Supine: 114/68  Sitting:  Standing:    SaO2: 99 RA  MODE:  Ambulation: 210 ft   POST:  Rate/Rhythm: 84 SR  BP:  Supine:   Sitting: 141/54  Standing:    SaO2: 100 RA 1045-1200 On arrival pt in bed. Assisted X 1 and used walker to ambulate. Gait steady with walker. Pt tires easily. During walk he abruptly turned and headed to room. Pt was exhausted and got in bed as I was attempting to get him to get in recliner. Pt c/o of all food tasting the same "bad". He called his wife this am and wanted to know what the plan was. States that he has had some confusing thoughts. Explained to pt and his wife that this is normal. Discussed Outpt. CRP with them, he agrees to referral to [facility]. Explained to him he needs to stay out of bed and in chair and encouraged two more walks today. We will follow him tomorrow.   Rodney Langton RN 02/17/2013 12:14 PM

## 2013-02-17 NOTE — Progress Notes (Addendum)
      [address]       ,[place] [address]             [phone]      3 Days Post-Op Procedure(s) (LRB): AORTIC VALVE REPLACEMENT (AVR) (N/A) CORONARY ARTERY BYPASS GRAFTING (CABG) (N/A) INTRAOPERATIVE TRANSESOPHAGEAL ECHOCARDIOGRAM (N/A)  Subjective:  Mr. Kinzler has no new complaints.  He has some incisional discomfort, but pain medication helps.  + BM  Objective: Vital signs in last 24 hours: Temp:  [97.6 F (36.4 C)-98.3 F (36.8 C)] 97.8 F (36.6 C) (01/02 0316) Pulse Rate:  [66-92] 78 (01/02 0316) Cardiac Rhythm:  [-] Normal sinus rhythm (01/01 2130) Resp:  [11-23] 22 (01/02 0316) BP: (99-130)/(72-92) 130/72 mmHg (01/02 0316) SpO2:  [88 %-100 %] 97 % (01/02 0316) Weight:  [171 lb (77.565 kg)] 171 lb (77.565 kg) (01/02 0300)  Intake/Output from previous day: 01/01 0701 - 01/02 0700 In: 930 [P.O.:840; I.V.:40; IV Piggyback:50] Out: 150 [Urine:150]  General appearance: alert, cooperative and no distress Heart: regular rate and rhythm Lungs: clear to auscultation bilaterally Abdomen: soft, non-tender; bowel sounds normal; no masses,  no organomegaly Extremities: edema trace Wound: clean and dry  Lab Results:  Recent Labs  02/15/13 1710 02/15/13 1713 02/16/13 0450  WBC 13.5*  --  11.3*  HGB 11.1* 10.5* 10.4*  HCT 31.3* 31.0* 28.5*  PLT 141*  --  123*   BMET:  Recent Labs  02/15/13 0420  02/15/13 1713 02/16/13 0450  NA 140  --  138 139  K 4.2  --  4.3 4.4  CL 104  --  99 102  CO2 26  --   --  23  GLUCOSE 126*  --  128* 104*  BUN 14  --  19 25*  CREATININE 0.87  < > 1.30 1.40*  CALCIUM 7.5*  --   --  8.2*  < > = values in this interval not displayed.  PT/INR:  Recent Labs  02/14/13 1440  LABPROT 16.2*  INR 1.33   ABG    Component Value Date/Time   PHART 7.366 02/15/2013 0500   HCO3 23.8 02/15/2013 0500   TCO2 26 02/15/2013 1713   ACIDBASEDEF 1.0 02/15/2013 0500   O2SAT 98.0 02/15/2013 0500   CBG (last 3)   Recent  Labs  02/15/13 2353 02/16/13 0430 02/16/13 0749  GLUCAP 83 95 92    Assessment/Plan: S/P Procedure(s) (LRB): AORTIC VALVE REPLACEMENT (AVR) (N/A) CORONARY ARTERY BYPASS GRAFTING (CABG) (N/A) INTRAOPERATIVE TRANSESOPHAGEAL ECHOCARDIOGRAM (N/A)  1. CV- NSR good rate control- pressure mildly elevated this morning will monitor 2. Pulm- off oxygen, encouraged continued use of IS 3. Renal- creatinine elevated yesterday at 1.40, weight below admission not on diuretic 4. GI- new air fluid level on lateral view of CXR, post chest tube removal, + bowel sounds, + BM will monitor 5. Dispo- patient stable, will d/c EPW, repeat BMET in AM- likely home in the next 24-48 hours   LOS: 3 days    BARRETT, ERIN 02/17/2013  patient examined and medical record reviewed,agree with above note. VAN TRIGT III,PETER 02/17/2013

## 2013-02-17 NOTE — Progress Notes (Signed)
EPW wires pulled per protocol. Ends intact. Pt instructed remain in bed for one hour. Pt verbalized understanding. VSS. Will continue to monitor pt closely.

## 2013-02-17 NOTE — Discharge Summary (Signed)
Physician Discharge Summary       [address]       [place] [address]             [phone]    Patient ID: CYRIC WILFERT MRN: [id number] DOB/AGE: 08/24/1955 58 y.o.  Admit date: 02/14/2013 Discharge date: 02/18/2013  Admission Diagnoses: 1. Multivessel CAD 2.History of hypertension 3.History of hyperlipidemia 4.History of bicuspid aortic valve 5.History of tobacco abuse 6.History of CKD 7.History of depression and anxiety 8.History of right renal cell cancer 9.History of chronic, post operative left arm pain 10.History of erectile dysfunction  Discharge Diagnoses:  1. Multivessel CAD 2.History of hypertension 3.History of hyperlipidemia 4.History of bicuspid aortic valve 5.History of tobacco abuse 6.History of CKD 7.History of depression and anxiety 8.History of right renal cell cancer 9.History of chronic, post operative left arm pain 10.History of erectile dysfunction 11.ABl anemia 12.Thrombocytopenia  Procedure (s):  1. Aortic valve replacement for severe bicuspid aortic stenosis with a 21-mm pericardial tissue valve (serial #[id number]) Edwards model 3300 TFX.  2. Coronary artery bypass grafting x3 (left internal mammary artery to left anterior descending, saphenous vein graft to diagonal, saphenous vein graft to circumflex marginal). 3. Endoscopic harvest of right leg greater saphenous vein by Dr. Charleen Kirks on 02/14/2013.  History of Presenting Illness: The patient returns for interval followup after initial consultation for severe aortic stenosis. The patient underwent left and right heart catheterization by Dr. Verdis Prime. The patient has significant multivessel disease with significant stenosis of the LAD-diagonal, the proximal OM and moderate disease of a small nondominant RCA. Right heart pressures were normal a cardiac index was greater than 2.2.  The patient is prepared to proceed with combined AVR and CABG. We'll use a biologic valve because  of the patient's previous history of GI bleeding and his personal preference not to be on Coumadin. I discussed the procedure in detail with the patient and his wife including indications, benefits, and expected postoperative recovery. I reviewed in details the risks to him of this operation including a less than 5% chance of stroke, major bleeding with transfusion, MI, wound infection, or death. He also understands the potential need for a postoperative pacemaker and issues with pain control since he has a long history of chronic pain syndrome followed by a pain specialist. After discussion of these issue,s he demonstrates his understanding and agrees to proceed with surgery under informed consent. The patient understands that he should communicate his chronic pain syndrome and chronic pain medications to his anesthesia team during his preoperative evaluation.  He obtained his preoperative labs and studies on December 26 and surgery was scheduled for December 30 at [facility]. Because the patient has a bicuspid valve, he underwent a CT of the chest to assess his ascending aorta. Contrast was not used because of his previous partial nephrectomy. 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 Pre operative carotid duplex US showed no significant carotid artery stenosis bilaterally. He underwent a CABG x 3 and AVR on 02/14/2013.  Brief Hospital Course:  The patient was extubated the evening of surgery without difficulty. He remained afebrile and hemodynamically stable.He was weaned off Dopamine. Theone Murdoch, a line, chest tubes, and foley were removed early in the post operative course. Lopressor was started and titrated accordingly. He was volume over loaded and diuresed. He was weaned off the insulin drip.The patient's HGA1C pre op was  5. He did have ABL anemia. His last H and H was  9.6 and 26.6. He also had thrombocytopenia. His  last platelet count was 109,000.The patient was felt surgically stable for transfer from the ICU to [facility] for further convalescence on 02/17/2012. He continues to progress with cardiac rehab. He was ambulating on room air. He has been tolerating a diet and has had a bowel movement. Epicardial pacing wires and chest tube sutures will be removed prior to discharge. Pain control was an issue post op. Duragesic patch did help. He is felt surgically stable for discharge today.   Latest Vital Signs: Blood pressure 128/67, pulse 78, temperature 98.1 F (36.7 C), temperature source Oral, resp. rate 18, height 5\' 6"  (1.676 m), weight 75.9 kg (167 lb 5.3 oz), SpO2 98.00%.  Physical Exam: General appearance: alert, cooperative and no distress  Heart: regular rate and rhythm  Lungs: clear to auscultation bilaterally  Abdomen: soft, non-tender; bowel sounds normal; no masses, no organomegaly  Extremities: edema trace  Wound: clean and dry   Discharge Condition:Stable  Recent laboratory studies:  Lab Results  Component Value Date   WBC 7.9 02/17/2013   HGB 9.6* 02/17/2013   HCT 26.6* 02/17/2013   MCV 89.6 02/17/2013   PLT 109* 02/17/2013   Lab Results  Component Value Date   NA 140 02/18/2013   K 3.8 02/18/2013   CL 101 02/18/2013   CO2 23 02/18/2013   CREATININE 0.95 02/18/2013   GLUCOSE 92 02/18/2013      Diagnostic Studies: Dg Chest 2 View  02/17/2013   CLINICAL DATA:  CABG.  Aortic valve replacement.  EXAM: CHEST  2 VIEW  COMPARISON:  02/16/2013.  FINDINGS: Interim removal of mediastinal drainage catheter and left chest tube. Right IJ line in stable position. Prior CABG. Prior aortic valve replacement. Stable cardiomegaly. No pulmonary venous congestion. Mild basilar atelectasis. No pneumothorax or prominent pleural effusion. Stable elevation left hemidiaphragm. Stomach is nondistended. Surgical clips overlying left upper quadrant. No acute osseous abnormality.  IMPRESSION: 1. Interim removal of left chest  tube and mediastinal drainage catheter. 2. Persistent basilar atelectasis. 3. CABG and aortic valve replacement. No evidence of congestive heart failure.   Electronically Signed   By: Marcello Moores  Register   On: 02/17/2013 07:49   Ct Chest Wo Contrast  02/10/2013   CLINICAL DATA:  Smoker.  Renal cell carcinoma.  Preop CABG.  EXAM: CT CHEST WITHOUT CONTRAST  TECHNIQUE: Multidetector CT imaging of the chest was performed following the standard protocol without IV contrast.  COMPARISON:  Chest x-ray 01/25/2013.  CT 02/28/2003 .  FINDINGS: Mediastinum unremarkable. No adenopathy. 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 Coronary artery disease. Mild thickening of the esophagus is noted. Esophageal pathology cannot be excluded. Barium swallow could be performed as needed.  Large airways are patent. No focal pulmonary infiltrate. No pulmonary nodule or mass. No pleural effusion or pneumothorax.  Shotty axillary lymph nodes. Chest wall is intact. Adrenals are normal. Small nonspecific stable subcentimeter nodular density posterior aspect right lobe of the liver unchanged from prior study of 2005 consistent with a stable benign lesion such is a tiny cyst or hemangioma. Degenerative changes thoracic spine.  IMPRESSION: 1. 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 2. Coronary artery disease. 3. Mild thickening of the esophagus is noted diffusely. Esophageal pathology cannot be excluded. Barium swallow can be performed.   Electronically Signed   By: Marcello Moores  Register   On: 02/10/2013 08:26  Discharge Orders   Future Appointments Provider Department Dept Phone   02/28/2013 9:30 AM Rogelia Mire, NP [place] Office [phone]   03/15/2013 12:30 PM Ivin Poot, MD [facility] [phone]   Future Orders Complete By Expires   Amb Referral to Cardiac Rehabilitation  As directed    Comments:     Pt agrees to referral to [place] [facility]  CRP.      Discharge Medications:   Medication List    STOP taking these medications       irbesartan 150 MG tablet  Commonly known as:  AVAPRO      TAKE these medications       ALPRAZolam 1 MG tablet  Commonly known as:  XANAX  Take 1 mg by mouth 4 (four) times daily.     ARTHROTEC 50-0.2 MG Tbec  Generic drug:  Diclofenac-Misoprostol  Take 1 tablet by mouth 2 (two) times daily.     aspirin 81 MG EC tablet  Take 1 tablet (81 mg total) by mouth daily.     carisoprodol 350 MG tablet  Commonly known as:  SOMA  Take 350 mg by mouth 2 (two) times daily.     DULoxetine 30 MG capsule  Commonly known as:  CYMBALTA  Take 30 mg by mouth 2 (two) times daily.     escitalopram 10 MG tablet  Commonly known as:  LEXAPRO  Take 10 mg by mouth 2 (two) times daily.     eszopiclone 2 MG Tabs tablet  Commonly known as:  LUNESTA  Take 2 mg by mouth at bedtime. Take immediately before bedtime     fentaNYL 75 MCG/HR  Commonly known as:  DURAGESIC - dosed mcg/hr  Place 1 patch (75 mcg total) onto the skin every 3 (three) days.     metoprolol tartrate 25 MG tablet  Commonly known as:  LOPRESSOR  Take 1 tablet (25 mg total) by mouth 2 (two) times daily.     NUCYNTA 75 MG Tabs  Generic drug:  Tapentadol HCl  Take 75 mg by mouth 4 (four) times daily.     OXYCONTIN 30 MG T12a  Generic drug:  OxyCODONE HCl ER  Take 1 tablet by mouth 3 (three) times daily.     sildenafil 100 MG tablet  Commonly known as:  VIAGRA  Take 100 mg by mouth daily as needed for erectile dysfunction.     SYMAX-SR 0.375 MG 12 hr tablet  Generic drug:  hyoscyamine  Take 0.375 mg by mouth 2 (two) times daily.        The patient has been discharged on:   1.Beta Blocker:  Yes [ x  ]                              No   [   ]                              If No, reason:  2.Ace Inhibitor/ARB: Yes [   ]                                     No  [  x  ]  If No, reason: Blood  pressure  3.Statin:   Yes [   ]                  No  [   ]                  If No, reason: To be discussed with cardiologist-may be intolerant  4.Shela Commons:  Yes  [  x ]                  No   [   ]                  If No, reason:  Follow Up Appointments: Follow-up Information   Follow up with Sinclair Grooms, MD On 02/28/2013. (Appointment time is 9:30 am and is with NP Ignacia Bayley)    Specialty:  Cardiology   Contact information:   [address] [place] [address] [phone]       Follow up with Len Childs, MD On 03/15/2013. (PA/LAT CXR to be taken (at [facility] which is in the same building as Dr. Lucianne Lei Trigt's office) on 03/15/2013 at 11:30 am;Appointment with Dr. Prescott Gum is on 03/15/2013 at 12:30 pm )    Specialty:  Cardiothoracic Surgery   Contact information:   [address] [place] [address] [phone]       Signed: Lars Pinks MPA-C 02/18/2013, 8:47 AM

## 2013-02-18 LAB — TYPE AND SCREEN
ABO/RH(D): A POS
Antibody Screen: NEGATIVE
Unit division: 0
Unit division: 0
Unit division: 0
Unit division: 0

## 2013-02-18 LAB — BASIC METABOLIC PANEL
BUN: 19 mg/dL (ref 6–23)
CALCIUM: 8.1 mg/dL — AB (ref 8.4–10.5)
CO2: 23 mEq/L (ref 19–32)
CREATININE: 0.95 mg/dL (ref 0.50–1.35)
Chloride: 101 mEq/L (ref 96–112)
GFR calc Af Amer: >90 mL/min (ref >90)
GFR calc non Af Amer: >90 mL/min (ref >90)
GLUCOSE: 92 mg/dL (ref 70–99)
Potassium: 3.8 mEq/L (ref 3.7–5.3)
Sodium: 140 mEq/L (ref 137–147)

## 2013-02-18 MED ORDER — ASPIRIN 81 MG PO TBEC: 81.0000 mg | DELAYED_RELEASE_TABLET | Freq: Every day | ORAL | Status: DC

## 2013-02-18 MED ORDER — SIMVASTATIN 20 MG PO TABS: 20.0000 mg | ORAL_TABLET | Freq: Every day | ORAL | Status: DC

## 2013-02-18 MED ORDER — METOPROLOL TARTRATE 25 MG PO TABS: 25.0000 mg | ORAL_TABLET | Freq: Two times a day (BID) | ORAL | Status: DC

## 2013-02-18 MED ORDER — SIMVASTATIN 20 MG PO TABS
20.0000 mg | ORAL_TABLET | Freq: Every day | ORAL | Status: DC
  Filled 2013-02-18: qty 1

## 2013-02-18 MED ORDER — FENTANYL 75 MCG/HR TD PT72: 75.0000 ug | MEDICATED_PATCH | TRANSDERMAL | Status: DC

## 2013-02-18 MED ORDER — METOPROLOL TARTRATE 25 MG PO TABS
25.0000 mg | ORAL_TABLET | Freq: Two times a day (BID) | ORAL | Status: DC
  Administered 2013-02-18: 25 mg via ORAL
  Filled 2013-02-18 (×3): qty 1

## 2013-02-18 MED ORDER — POTASSIUM CHLORIDE CRYS ER 20 MEQ PO TBCR
30.0000 meq | EXTENDED_RELEASE_TABLET | Freq: Once | ORAL | Status: DC
  Filled 2013-02-18 (×2): qty 1

## 2013-02-18 MED ORDER — METOPROLOL TARTRATE 25 MG PO TABS: 12.5000 mg | ORAL_TABLET | Freq: Two times a day (BID) | ORAL | Status: DC

## 2013-02-18 MED ORDER — POTASSIUM CHLORIDE CRYS ER 20 MEQ PO TBCR: 20.0000 meq | EXTENDED_RELEASE_TABLET | Freq: Once | ORAL | Status: DC

## 2013-02-18 NOTE — Progress Notes (Signed)
DC central line and CT sutures per MD order and protocol; steri strips applied to CT incisions; pressure and dressing applied to central line; informed patient for dressing to stay in place for 24 hrs; patient advised to stay on bedrest for 30 minutes; betadine applied to chest incision and right leg incisions; patient is stable; call bell within reach; wife at bedside; will continue to monitor.  BARNETT, Marsh Dolly

## 2013-02-18 NOTE — Progress Notes (Signed)
CARDIAC REHAB PHASE I   For discharge today.  Education completed regarding sternal precautions, signs and symptoms of infection, heart healthy diet, activity restrictions, driving restrictions, and referred to phase II cardiac rehab in [place].  Patient seemed aggravated and not very receptive to diet education.  He does not like vegetables, has never eaten them and does not want to start now.  Also, short about taking cholesterol medication.  Discussed the need to keep his cholesterol lower than before surgery.   [id number] Liliane Channel RN, BSN 02/18/2013 8:30 AM

## 2013-02-18 NOTE — Discharge Summary (Signed)
patient examined and medical record reviewed,agree with above note. VAN TRIGT III,Max Byrd 02/18/2013

## 2013-02-18 NOTE — Progress Notes (Addendum)
      [address]       [place] [address]             [phone]        4 Days Post-Op Procedure(s) (LRB): AORTIC VALVE REPLACEMENT (AVR) (N/A) CORONARY ARTERY BYPASS GRAFTING (CABG) (N/A) INTRAOPERATIVE TRANSESOPHAGEAL ECHOCARDIOGRAM (N/A)  Subjective: Patient without complaints and wants to go home.  Objective: Vital signs in last 24 hours: Temp:  [97.8 F (36.6 C)-98.3 F (36.8 C)] 98.1 F (36.7 C) (01/03 0533) Pulse Rate:  [67-78] 78 (01/03 0533) Cardiac Rhythm:  [-] Normal sinus rhythm (01/02 2100) Resp:  [18-20] 18 (01/03 0533) BP: (93-128)/(64-73) 128/67 mmHg (01/03 0533) SpO2:  [94 %-98 %] 98 % (01/03 0533) Weight:  [75.9 kg (167 lb 5.3 oz)] 75.9 kg (167 lb 5.3 oz) (01/03 0533)  Pre op weight  kg Current Weight  02/18/13 75.9 kg (167 lb 5.3 oz)      Intake/Output from previous day: 01/02 0701 - 01/03 0700 In: 720 [P.O.:720] Out: 250 [Urine:250]   Physical Exam:  Cardiovascular: RRR, no murmurs, gallops, or rubs. Pulmonary: Clear to auscultation bilaterally; no rales, wheezes, or rhonchi. Abdomen: Soft, non tender, bowel sounds present. Extremities: Trace bilateral lower extremity edema. Wounds: Clean and dry.  No erythema or signs of infection.  Lab Results: CBC: Recent Labs  02/16/13 0450 02/17/13 0830  WBC 11.3* 7.9  HGB 10.4* 9.6*  HCT 28.5* 26.6*  PLT 123* 109*   BMET:  Recent Labs  02/16/13 0450 02/17/13 0620  NA 139 139  K 4.4 4.2  CL 102 101  CO2 23 23  GLUCOSE 104* 84  BUN 25* 30*  CREATININE 1.40* 1.10  CALCIUM 8.2* 8.0*    PT/INR:  Lab Results  Component Value Date   INR 1.33 02/14/2013   INR 1.06 02/10/2013   INR 0.98 01/25/2013   ABG:  INR: Will add last result for INR, ABG once components are confirmed Will add last 4 CBG results once components are confirmed  Assessment/Plan:  1. CV - S/p NSTEMI.SR, PVCs. On Lopressor 12.5 bid. Will increase to 25 bid as HR in the 90's-low 100's. Will  likely resume Avapro at discharge 2.  Pulmonary - Encourage incentive spirometer 3.Not on a statin-patient will discuss with cardiologist after discharge 4. Acute blood loss anemia - Last H and H 9.6 and 5.Thrombocytopenia-last platelets 109,000 6.Remove sutures 7.Remove central line 8.Await BMET results 9.Discharge today   Bliss Tsang MPA-C 02/18/2013,7:29 AM

## 2013-02-22 ENCOUNTER — Other Ambulatory Visit: Payer: Self-pay | Admitting: *Deleted

## 2013-02-22 ENCOUNTER — Other Ambulatory Visit: Payer: Self-pay

## 2013-02-22 DIAGNOSIS — G8918 Other acute postprocedural pain: Secondary | ICD-10-CM

## 2013-02-22 DIAGNOSIS — I2510 Atherosclerotic heart disease of native coronary artery without angina pectoris: Secondary | ICD-10-CM

## 2013-02-22 MED ORDER — OXYCODONE HCL ER 30 MG PO T12A: 1.0000 | EXTENDED_RELEASE_TABLET | Freq: Three times a day (TID) | ORAL | Status: DC

## 2013-02-22 MED ORDER — TAPENTADOL HCL 75 MG PO TABS: 75.0000 mg | ORAL_TABLET | Freq: Four times a day (QID) | ORAL | Status: DC

## 2013-02-23 ENCOUNTER — Ambulatory Visit
Admission: RE | Admit: 2013-02-23 | Discharge: 2013-02-23 | Disposition: A | Discharge status: Home / Self Care | Payer: Federal, State, Local not specified - PPO | Source: Ambulatory Visit | Attending: Cardiothoracic Surgery | Admitting: Cardiothoracic Surgery

## 2013-02-23 ENCOUNTER — Ambulatory Visit (INDEPENDENT_AMBULATORY_CARE_PROVIDER_SITE_OTHER): Payer: Self-pay | Admitting: Cardiothoracic Surgery

## 2013-02-23 ENCOUNTER — Encounter: Payer: Self-pay | Admitting: Cardiothoracic Surgery

## 2013-02-23 VITALS — BP 91/65 | HR 86 | Temp 97.7°F | Resp 16 | Ht 68.0 in | Wt 170.0 lb

## 2013-02-23 DIAGNOSIS — Z952 Presence of prosthetic heart valve: Secondary | ICD-10-CM

## 2013-02-23 DIAGNOSIS — Z954 Presence of other heart-valve replacement: Secondary | ICD-10-CM

## 2013-02-23 DIAGNOSIS — I2510 Atherosclerotic heart disease of native coronary artery without angina pectoris: Secondary | ICD-10-CM

## 2013-02-23 DIAGNOSIS — I359 Nonrheumatic aortic valve disorder, unspecified: Secondary | ICD-10-CM

## 2013-02-23 DIAGNOSIS — Q231 Congenital insufficiency of aortic valve: Secondary | ICD-10-CM

## 2013-02-23 DIAGNOSIS — I350 Nonrheumatic aortic (valve) stenosis: Secondary | ICD-10-CM

## 2013-02-23 DIAGNOSIS — G894 Chronic pain syndrome: Secondary | ICD-10-CM

## 2013-02-23 DIAGNOSIS — Z951 Presence of aortocoronary bypass graft: Secondary | ICD-10-CM

## 2013-02-23 DIAGNOSIS — I4891 Unspecified atrial fibrillation: Secondary | ICD-10-CM

## 2013-02-23 DIAGNOSIS — I482 Chronic atrial fibrillation, unspecified: Secondary | ICD-10-CM

## 2013-02-23 NOTE — Progress Notes (Signed)
PCP is Wenda Low, MD Referring Provider is Wenda Low, MD  Chief Complaint  Patient presents with  . Routine Post Op    s/p CABG/AVR 12/30/134 with cxr...and to discuss pain management    HPI: Patient presents for first postop visit after combined aVR-CABG for severe aortic stenosis and three-vessel CABG. The patient presents for review of his pain management because the patient has long-standing pre-existing chronic pain for which she is followed a pain clinic by Dr. Hardin Negus. Our plan is to continue his baseline pain medication as prescribed by Dr. Hardin Negus for the past 2 years and had additional pain medication for the surgical recovery in the form of a fentanyl patch 75 mcg every 72 hours  On discussing the patient's activity level and pain level it is apparent that his pain is fairly well controlled but he is still able to ambulate,and do his breathing exercises. He denies fever, angina or nausea. He does have some slight ankle swelling. He is sleeping satisfactorily.  Past Medical History  Diagnosis Date  . Erectile dysfunction     INJECTIONS OF PROSTAGLANDIN BY UROLGIST  . Chronic kidney disease     STAGE 2  . Cancer 2003    H/O RENAL R RENAL CELL CANCER  . H/O Bell's palsy 2010 X2  . Depression   . Anxiety   . Pain, chronic postoperative     S/P LEFT ARM SURGERY  . IBS (irritable bowel syndrome)   . Bicuspid aortic valve     AORTIC STENOSIS PER ECHO  . Heart murmur   . Hypertension   . Hyperlipidemia   . CAD (coronary artery disease)   . Complication of anesthesia     could not speak after waking one   surgery         . Shortness of breath     Past Surgical History  Procedure Laterality Date  . Colonoscopy  2008    DIVERTICULOSIS  . Anal sphincterotomy  04/13/03    DR. GRAPEY  . Partial nephrectomy  08/24/02    RIGHT...DR. Risa Grill  . Lateral epicondyle release Left 07/29/07    DR. GRAVES  . Cardiac catheterization    . Tonsillectomy    . Aortic valve  replacement N/A 02/14/2013    Procedure: AORTIC VALVE REPLACEMENT (AVR);  Surgeon: Ivin Poot, MD;  Location: [facility];  Service: Open Heart Surgery;  Laterality: N/A;  . Coronary artery bypass graft N/A 02/14/2013    Procedure: CORONARY ARTERY BYPASS GRAFTING (CABG);  Surgeon: Ivin Poot, MD;  Location: [facility];  Service: Open Heart Surgery;  Laterality: N/A;  . Intraoperative transesophageal echocardiogram N/A 02/14/2013    Procedure: INTRAOPERATIVE TRANSESOPHAGEAL ECHOCARDIOGRAM;  Surgeon: Ivin Poot, MD;  Location: [facility];  Service: Open Heart Surgery;  Laterality: N/A;    Family History  Problem Relation Age of Onset  . Cancer Mother     BREAST/OVARIAN  . Heart disease Father     S/P MI/CABG    Social History History  Substance Use Topics  . Smoking status: Former Smoker -- 1.50 packs/day for 5 years    Types: Cigarettes    Quit date: 01/21/1976  . Smokeless tobacco: Current User    Types: Snuff  . Alcohol Use: No     Comment: 4 BEERS PER WEEK UP UNTIL ABOUT A MONTH AGO    Current Outpatient Prescriptions  Medication Sig Dispense Refill  . ALPRAZolam (XANAX) 1 MG tablet Take 1 mg by mouth 4 (four) times daily.      Marland Kitchen  aspirin EC 81 MG EC tablet Take 1 tablet (81 mg total) by mouth daily.      . carisoprodol (SOMA) 350 MG tablet Take 350 mg by mouth 2 (two) times daily.      . Diclofenac-Misoprostol (ARTHROTEC) 50-0.2 MG TBEC Take 1 tablet by mouth 2 (two) times daily.      . DULoxetine (CYMBALTA) 30 MG capsule Take 30 mg by mouth 2 (two) times daily.      Marland Kitchen escitalopram (LEXAPRO) 10 MG tablet Take 10 mg by mouth 2 (two) times daily.      . eszopiclone (LUNESTA) 2 MG TABS tablet Take 2 mg by mouth at bedtime. Take immediately before bedtime      . hyoscyamine (SYMAX-SR) 0.375 MG 12 hr tablet Take 0.375 mg by mouth 2 (two) times daily.      . metoprolol tartrate (LOPRESSOR) 25 MG tablet Take 1 tablet (25 mg total) by mouth 2 (two) times daily.  60 tablet  1  .  OxyCODONE HCl ER (OXYCONTIN) 30 MG T12A Take 1 tablet by mouth 3 (three) times daily.  30 each  0  . sildenafil (VIAGRA) 100 MG tablet Take 100 mg by mouth daily as needed for erectile dysfunction.      . Tapentadol HCl (NUCYNTA) 75 MG TABS Take 1 tablet (75 mg total) by mouth 4 (four) times daily.  30 tablet  0   No current facility-administered medications for this visit.    Allergies  Allergen Reactions  . Aspirin Other (See Comments)    H/O BLEEDING ULCER  . Buspirone Other (See Comments)    MEMORY LOSS  . Klonopin [Clonazepam] Other (See Comments)    Erectile dysfunction  . Nsaids Other (See Comments)    Stomach bleeding  . Zoloft [Sertraline Hcl] Other (See Comments)    FELT TERRIBLE  . Reglan [Metoclopramide] Anxiety    EXCITABILITY     Review of Systems surgical incisions healing without drainage  BP 91/65  Pulse 86  Temp(Src) 97.7 F (36.5 C) (Oral)  Resp 16  Ht 5\' 8"  (1.727 m)  Wt 170 lb (77.111 kg)  BMI 25.85 kg/m2  SpO2 98% Physical Exam Alert and comfortable oriented and appropriate Sternal incision dry and clean Breath sounds clear slightly diminished at left base Heart rhythm regular murmur or gallop Leg incisions dry and clean, 2+ bilateral pedal edema  Diagnostic Tests: Chest x-ray with left mild pleural effusion, slight elevation of left hemidiaphragm  Impression: Satisfactorily postop progress after multivessel CABG with combined aVR. We'll continue current pain management  Plan: Continue no driving, ambulate 10 minutes twice a day, no lifting more than 10 pounds, and return for schedule office followup. Patient will not need any more pain medication prescription renewal from this office until his fentanyl patch prescription is completed and this should last for least 3 weeks or until he returns.

## 2013-02-28 ENCOUNTER — Ambulatory Visit (INDEPENDENT_AMBULATORY_CARE_PROVIDER_SITE_OTHER): Payer: Federal, State, Local not specified - PPO | Admitting: Nurse Practitioner

## 2013-02-28 ENCOUNTER — Encounter: Payer: Self-pay | Admitting: Nurse Practitioner

## 2013-02-28 VITALS — BP 116/68 | HR 73 | Ht 68.0 in | Wt 175.0 lb

## 2013-02-28 DIAGNOSIS — I2510 Atherosclerotic heart disease of native coronary artery without angina pectoris: Secondary | ICD-10-CM

## 2013-02-28 DIAGNOSIS — I359 Nonrheumatic aortic valve disorder, unspecified: Secondary | ICD-10-CM

## 2013-02-28 DIAGNOSIS — E785 Hyperlipidemia, unspecified: Secondary | ICD-10-CM

## 2013-02-28 DIAGNOSIS — I350 Nonrheumatic aortic (valve) stenosis: Secondary | ICD-10-CM

## 2013-02-28 DIAGNOSIS — I10 Essential (primary) hypertension: Secondary | ICD-10-CM

## 2013-02-28 MED ORDER — NITROGLYCERIN 0.4 MG SL SUBL: 0.4000 mg | SUBLINGUAL_TABLET | SUBLINGUAL | Status: DC | PRN

## 2013-02-28 NOTE — Patient Instructions (Signed)
Your physician recommends that you continue on your current medications as directed. Please refer to the Current Medication list given to you today.  An Rx for Nitro has been sent to your pharmacy  Your physician recommends that you return for a FASTING lipid profile and lft on 04/20/13  Your physician recommends that you schedule a follow-up appointment on 04/20/13 @8 :30 with Dr.Smith

## 2013-02-28 NOTE — Progress Notes (Signed)
Patient Name: Max Byrd Date of Encounter: 02/28/2013  Primary Care Provider:  Wenda Low, MD Primary Cardiologist:  Linard Millers, MD   Patient Profile  58 year old male with history of bicuspid aortic valve and aortic stenosis who recently underwent aortic valve replacement and CABG x3.  Problem List   Past Medical History  Diagnosis Date  . Erectile dysfunction     INJECTIONS OF PROSTAGLANDIN BY UROLGIST  . Chronic kidney disease     STAGE 2  . Renal cell carcinoma of right kidney 2003    H/O RENAL R RENAL CELL CANCER  . H/O Bell's palsy 2010 X2  . Depression   . Anxiety   . Pain, chronic postoperative     S/P LEFT ARM SURGERY  . IBS (irritable bowel syndrome)   . Bicuspid aortic valve     a. 01/2013 s/p AVR - 68mm Edwards 3300 TFX pericardial tissue valve, ser # [id number].  Marland Kitchen Hypertension   . Hyperlipidemia   . CAD (coronary artery disease)     a. 01/2013 CABGx3: LIMA->LAD, VG->Diag, VG->OM (performed @ time of AVR)  . Complication of anesthesia     could not speak after waking one   surgery          Past Surgical History  Procedure Laterality Date  . Colonoscopy  2008    DIVERTICULOSIS  . Anal sphincterotomy  04/13/03    DR. GRAPEY  . Partial nephrectomy  08/24/02    RIGHT...DR. Risa Grill  . Lateral epicondyle release Left 07/29/07    DR. GRAVES  . Cardiac catheterization    . Tonsillectomy    . Aortic valve replacement N/A 02/14/2013    Procedure: AORTIC VALVE REPLACEMENT (AVR);  Surgeon: Ivin Poot, MD;  Location: [facility];  Service: Open Heart Surgery;  Laterality: N/A;  . Coronary artery bypass graft N/A 02/14/2013    Procedure: CORONARY ARTERY BYPASS GRAFTING (CABG);  Surgeon: Ivin Poot, MD;  Location: [facility];  Service: Open Heart Surgery;  Laterality: N/A;  . Intraoperative transesophageal echocardiogram N/A 02/14/2013    Procedure: INTRAOPERATIVE TRANSESOPHAGEAL ECHOCARDIOGRAM;  Surgeon: Ivin Poot, MD;  Location: [facility];  Service: Open Heart  Surgery;  Laterality: N/A;    Allergies  Allergies  Allergen Reactions  . Aspirin Other (See Comments)    H/O BLEEDING ULCER  . Buspirone Other (See Comments)    MEMORY LOSS  . Klonopin [Clonazepam] Other (See Comments)    Erectile dysfunction  . Nsaids Other (See Comments)    Stomach bleeding  . Zoloft [Sertraline Hcl] Other (See Comments)    FELT TERRIBLE  . Reglan [Metoclopramide] Anxiety    EXCITABILITY     HPI  58 year old male with the above problem list.  No history of bicuspid aortic valve and progressive aortic stenosis and subsequently underwent diagnostic catheterization revealing native multivessel coronary artery disease as well.  On December 30, he was admitted to [facility] and underwent aortic valve replacement with a pericardial tissue valve and also coronary artery bypass grafting x3.  His postoperative course was uncomplicated since his discharge, he has done quite well.  He has been slowly increasing his activities with minimal dyspnea.  He has had some chest wall/incisional pain but otherwise has not had any angina.  He is looking forward to getting back into the gym.  He does have mild lower extremity edema but otherwise denies PND, orthopnea, dizziness, syncope, palpitations, or early satiety.  Home Medications  Prior to Admission medications   Medication  Sig Start Date End Date Taking? Authorizing Provider  ALPRAZolam Prudy Feeler) 1 MG tablet Take 1 mg by mouth 4 (four) times daily.   Yes Historical Provider, MD  aspirin EC 81 MG EC tablet Take 1 tablet (81 mg total) by mouth daily. 02/18/13  Yes Donielle Margaretann Loveless, PA-C  carisoprodol (SOMA) 350 MG tablet Take 350 mg by mouth 2 (two) times daily.   Yes Historical Provider, MD  Diclofenac-Misoprostol (ARTHROTEC) 50-0.2 MG TBEC Take 1 tablet by mouth 2 (two) times daily.   Yes Historical Provider, MD  DULoxetine (CYMBALTA) 30 MG capsule Take 30 mg by mouth 2 (two) times daily.   Yes Historical Provider, MD    escitalopram (LEXAPRO) 10 MG tablet Take 10 mg by mouth 2 (two) times daily.   Yes Historical Provider, MD  eszopiclone (LUNESTA) 2 MG TABS tablet Take 2 mg by mouth at bedtime. Take immediately before bedtime   Yes Historical Provider, MD  fentaNYL (DURAGESIC - DOSED MCG/HR) 75 MCG/HR Place 75 mcg onto the skin every 3 (three) days.  02/18/13  Yes Historical Provider, MD  furosemide (LASIX) 40 MG tablet Take 40 mg by mouth daily.  02/23/13  Yes Historical Provider, MD  hyoscyamine (SYMAX-SR) 0.375 MG 12 hr tablet Take 0.375 mg by mouth 2 (two) times daily.   Yes Historical Provider, MD  irbesartan (AVAPRO) 150 MG tablet Take 150 mg by mouth daily.  02/20/13  Yes Historical Provider, MD  metoprolol tartrate (LOPRESSOR) 25 MG tablet Take 1 tablet (25 mg total) by mouth 2 (two) times daily. 02/18/13  Yes Donielle Margaretann Loveless, PA-C  OxyCODONE HCl ER (OXYCONTIN) 30 MG T12A Take 1 tablet by mouth 3 (three) times daily. 02/22/13  Yes Kerin Perna, MD  sildenafil (VIAGRA) 100 MG tablet Take 100 mg by mouth daily as needed for erectile dysfunction.   Yes Historical Provider, MD  Tapentadol HCl (NUCYNTA) 75 MG TABS Take 1 tablet (75 mg total) by mouth 4 (four) times daily. 02/22/13  Yes Kerin Perna, MD  nitroGLYCERIN (NITROSTAT) 0.4 MG SL tablet Place 1 tablet (0.4 mg total) under the tongue every 5 (five) minutes as needed for chest pain. 02/28/13   Ok Anis, NP    Review of Systems  He's had mild lower extremity edema along with some dyspnea on exertion though this is steadily improving.  He's also had chest wall pain and incisional pain as outlined above.  All other systems reviewed and are otherwise negative except as noted above.  Physical Exam  Blood pressure 116/68, pulse 73, height 5\' 8"  (1.727 m), weight 175 lb (79.379 kg).  General: Pleasant, NAD Psych: Normal affect. Neuro: Alert and oriented X 3. Moves all extremities spontaneously. HEENT: Normal  Neck: Supple without bruits or  JVD. Lungs:  Resp regular and unlabored, CTA. Heart: RRR no s3, s4, 2/6 SEM RUSB.  Chest wall incision w/o erythema/bleeding/discharge. Abdomen: Soft, non-tender, non-distended, BS + x 4.  Extremities: No clubbing, cyanosis, 1+ bilat LE edema. DP/PT/Radials 2+ and equal bilaterally.  R leg incisions w/o erythema/bleeding/discharge.  Accessory Clinical Findings  ECG - sinus rhythm, 73, incomplete left bundle, rightward axis, no acute ST or T changes.  Assessment & Plan  1.  Bicuspid aortic valve/aortic stenosis: Status post successful aortic valve replacement with a pericardial tissue valve.  His postoperative course has been uncomplicated and he is steadily increasing his activity.  He has followup with surgery later this month.  2.  Coronary artery disease: Status post coronary artery  bypass grafting.  Is not having any chest pain.  He is considering outpatient cardiac rehabilitation however indicates that his exercise all his life and feels like to begin rehabilitation on his own.  We did discuss the benefits of cardiac rehabilitation over self directed rehabilitation he will continue to consider.  He remains on aspirin beta blocker and ARB therapy.  As he does have coronary artery disease he would benefit from statin therapy however before initiating, I have arranged for fasting lipids as there were no lipids in our system.  He'll consider a statin once he can review his numbers with Dr. Tamala Julian.  We have sent him a prescription for sublingual nitroglycerin to be used as needed for angina.  3.  Hypertension: Stable.  4.  Lipids: Status currently unknown.  Patient will consider statin based on lipid profile which is pending.  We discussed the benefits of statins in patients with coronary artery disease.  5.  Disposition: Patient is followed with CT surgery later this month and we've arranged followup with Dr. Tamala Julian in approximately 2 months.    Murray Hodgkins, NP 02/28/2013, 10:51 AM

## 2013-03-15 ENCOUNTER — Ambulatory Visit: Payer: Federal, State, Local not specified - PPO | Admitting: Cardiothoracic Surgery

## 2013-03-16 ENCOUNTER — Other Ambulatory Visit: Payer: Self-pay | Admitting: *Deleted

## 2013-03-16 DIAGNOSIS — I359 Nonrheumatic aortic valve disorder, unspecified: Secondary | ICD-10-CM

## 2013-03-17 ENCOUNTER — Ambulatory Visit (INDEPENDENT_AMBULATORY_CARE_PROVIDER_SITE_OTHER): Payer: Self-pay | Admitting: Cardiothoracic Surgery

## 2013-03-17 ENCOUNTER — Ambulatory Visit
Admission: RE | Admit: 2013-03-17 | Discharge: 2013-03-17 | Disposition: A | Discharge status: Home / Self Care | Payer: Federal, State, Local not specified - PPO | Source: Ambulatory Visit | Attending: Cardiothoracic Surgery | Admitting: Cardiothoracic Surgery

## 2013-03-17 ENCOUNTER — Encounter: Payer: Self-pay | Admitting: Cardiothoracic Surgery

## 2013-03-17 VITALS — BP 142/92 | HR 81 | Resp 16 | Ht 68.0 in | Wt 164.5 lb

## 2013-03-17 DIAGNOSIS — I2510 Atherosclerotic heart disease of native coronary artery without angina pectoris: Secondary | ICD-10-CM

## 2013-03-17 DIAGNOSIS — Z954 Presence of other heart-valve replacement: Secondary | ICD-10-CM

## 2013-03-17 DIAGNOSIS — I350 Nonrheumatic aortic (valve) stenosis: Secondary | ICD-10-CM

## 2013-03-17 DIAGNOSIS — I359 Nonrheumatic aortic valve disorder, unspecified: Secondary | ICD-10-CM

## 2013-03-17 DIAGNOSIS — G894 Chronic pain syndrome: Secondary | ICD-10-CM

## 2013-03-17 DIAGNOSIS — Z952 Presence of prosthetic heart valve: Secondary | ICD-10-CM

## 2013-03-17 DIAGNOSIS — Z951 Presence of aortocoronary bypass graft: Secondary | ICD-10-CM

## 2013-03-17 DIAGNOSIS — Q231 Congenital insufficiency of aortic valve: Secondary | ICD-10-CM

## 2013-03-17 NOTE — Progress Notes (Signed)
PCP is Wenda Low, MD Referring Provider is Sinclair Grooms, MD  Chief Complaint  Patient presents with  . Routine Post Op    s/p CABG 01/25/13 with cxr    HPI: One month after aVR CABG doing well Patient will be starting phase II cardiac rehabilitation at the hospital next week. Patient was seen by the cardiology office i mid-level and plans for lipid panel are made. Patient was seen by his pain clinic mid-level yesterday and his pain management currently consists of fentanyl patch with oral nucynta. He is having some musculoskeletal pain across his anterior chest but this is getting better. His sleep and overall exercise tolerance are improved. His incisions are healing well   Past Medical History  Diagnosis Date  . Erectile dysfunction     INJECTIONS OF PROSTAGLANDIN BY UROLGIST  . Chronic kidney disease     STAGE 2  . Renal cell carcinoma of right kidney 2003    H/O RENAL R RENAL CELL CANCER  . H/O Bell's palsy 2010 X2  . Depression   . Anxiety   . Pain, chronic postoperative     S/P LEFT ARM SURGERY  . IBS (irritable bowel syndrome)   . Bicuspid aortic valve     a. 01/2013 s/p AVR - 100mm Edwards 3300 TFX pericardial tissue valve, ser # [id number].  Marland Kitchen Hypertension   . Hyperlipidemia   . CAD (coronary artery disease)     a. 01/2013 CABGx3: LIMA->LAD, VG->Diag, VG->OM (performed @ time of AVR)  . Complication of anesthesia     could not speak after waking one   surgery           Past Surgical History  Procedure Laterality Date  . Colonoscopy  2008    DIVERTICULOSIS  . Anal sphincterotomy  04/13/03    DR. GRAPEY  . Partial nephrectomy  08/24/02    RIGHT...DR. Risa Grill  . Lateral epicondyle release Left 07/29/07    DR. GRAVES  . Cardiac catheterization    . Tonsillectomy    . Aortic valve replacement N/A 02/14/2013    Procedure: AORTIC VALVE REPLACEMENT (AVR);  Surgeon: Ivin Poot, MD;  Location: [facility];  Service: Open Heart Surgery;  Laterality: N/A;  . Coronary  artery bypass graft N/A 02/14/2013    Procedure: CORONARY ARTERY BYPASS GRAFTING (CABG);  Surgeon: Ivin Poot, MD;  Location: [facility];  Service: Open Heart Surgery;  Laterality: N/A;  . Intraoperative transesophageal echocardiogram N/A 02/14/2013    Procedure: INTRAOPERATIVE TRANSESOPHAGEAL ECHOCARDIOGRAM;  Surgeon: Ivin Poot, MD;  Location: [facility];  Service: Open Heart Surgery;  Laterality: N/A;    Family History  Problem Relation Age of Onset  . Cancer Mother     BREAST/OVARIAN  . Heart disease Father     S/P MI/CABG    Social History History  Substance Use Topics  . Smoking status: Former Smoker -- 1.50 packs/day for 5 years    Types: Cigarettes    Quit date: 01/21/1976  . Smokeless tobacco: Current User    Types: Snuff  . Alcohol Use: No     Comment: 4 BEERS PER WEEK UP UNTIL ABOUT A MONTH AGO    Current Outpatient Prescriptions  Medication Sig Dispense Refill  . ALPRAZolam (XANAX) 1 MG tablet Take 1 mg by mouth 4 (four) times daily.      Marland Kitchen aspirin EC 81 MG EC tablet Take 1 tablet (81 mg total) by mouth daily.      . carisoprodol (SOMA)  350 MG tablet Take 350 mg by mouth 2 (two) times daily.      . Diclofenac-Misoprostol (ARTHROTEC) 50-0.2 MG TBEC Take 1 tablet by mouth 2 (two) times daily.      . DULoxetine (CYMBALTA) 30 MG capsule Take 30 mg by mouth 2 (two) times daily.      Marland Kitchen escitalopram (LEXAPRO) 10 MG tablet Take 10 mg by mouth 2 (two) times daily.      . eszopiclone (LUNESTA) 2 MG TABS tablet Take 2 mg by mouth at bedtime. Take immediately before bedtime      . fentaNYL (DURAGESIC - DOSED MCG/HR) 75 MCG/HR Place 75 mcg onto the skin every 3 (three) days.       . furosemide (LASIX) 40 MG tablet Take 40 mg by mouth daily.       . hyoscyamine (SYMAX-SR) 0.375 MG 12 hr tablet Take 0.375 mg by mouth 2 (two) times daily.      . irbesartan (AVAPRO) 150 MG tablet Take 150 mg by mouth daily.       . metoprolol tartrate (LOPRESSOR) 25 MG tablet Take 1 tablet (25 mg  total) by mouth 2 (two) times daily.  60 tablet  1  . nitroGLYCERIN (NITROSTAT) 0.4 MG SL tablet Place 1 tablet (0.4 mg total) under the tongue every 5 (five) minutes as needed for chest pain.  25 tablet  3  . sildenafil (VIAGRA) 100 MG tablet Take 100 mg by mouth daily as needed for erectile dysfunction.      . Tapentadol HCl (NUCYNTA) 75 MG TABS Take 1 tablet (75 mg total) by mouth 4 (four) times daily.  30 tablet  0  . OxyCODONE HCl ER (OXYCONTIN) 30 MG T12A Take 1 tablet by mouth 3 (three) times daily.  30 each  0   No current facility-administered medications for this visit.    Allergies  Allergen Reactions  . Aspirin Other (See Comments)    H/O BLEEDING ULCER  . Buspirone Other (See Comments)    MEMORY LOSS  . Klonopin [Clonazepam] Other (See Comments)    Erectile dysfunction  . Nsaids Other (See Comments)    Stomach bleeding  . Zoloft [Sertraline Hcl] Other (See Comments)    FELT TERRIBLE  . Reglan [Metoclopramide] Anxiety    EXCITABILITY     Review of Systems improved appetite weight down 15 pounds from preop, no edema no fever no  influenza symptoms  BP 142/92  Pulse 81  Resp 16  Ht 5\' 8"  (1.727 m)  Wt 164 lb 8 oz (74.617 kg)  BMI 25.02 kg/m2  SpO2 99% Physical Exam Alert and appears stronger, not narcotize Lungs clear bilaterally Heart rate regular sharp closure sound of aortic valve no murmur Chest and leg incision well-healed without edema  Diagnostic Tests: Chest x-ray with clear lung fields, no pleural effusions, stable elevation of left hemidiaphragm  Impression: Making progress. Patient encouraged to start outpatient cardiac rehabilitation. We'll follow current pain medication plan as outlined by his pain specialist. We'll continue the fentanyl patch for his chest musculoskeletal pain until his next office visit in early March.  Plan: Return for followup in one month to review progress.

## 2013-04-18 ENCOUNTER — Encounter: Payer: Self-pay | Admitting: Interventional Cardiology

## 2013-04-18 ENCOUNTER — Ambulatory Visit (INDEPENDENT_AMBULATORY_CARE_PROVIDER_SITE_OTHER): Payer: Federal, State, Local not specified - PPO | Admitting: Interventional Cardiology

## 2013-04-18 VITALS — BP 140/102 | HR 80 | Ht 66.0 in | Wt 162.4 lb

## 2013-04-18 DIAGNOSIS — Z951 Presence of aortocoronary bypass graft: Secondary | ICD-10-CM

## 2013-04-18 DIAGNOSIS — E785 Hyperlipidemia, unspecified: Secondary | ICD-10-CM

## 2013-04-18 DIAGNOSIS — Z952 Presence of prosthetic heart valve: Secondary | ICD-10-CM | POA: Insufficient documentation

## 2013-04-18 DIAGNOSIS — Z954 Presence of other heart-valve replacement: Secondary | ICD-10-CM

## 2013-04-18 DIAGNOSIS — I10 Essential (primary) hypertension: Secondary | ICD-10-CM

## 2013-04-18 DIAGNOSIS — I2510 Atherosclerotic heart disease of native coronary artery without angina pectoris: Secondary | ICD-10-CM

## 2013-04-18 NOTE — Patient Instructions (Signed)
Your physician recommends that you continue on your current medications as directed. Please refer to the Current Medication list given to you today.  Your physician has requested that you have an echocardiogram. Echocardiography is a painless test that uses sound waves to create images of your heart. It provides your doctor with information about the size and shape of your heart and how well your heart's chambers and valves are working. This procedure takes approximately one hour. There are no restrictions for this procedure.(To be done in Aug 2016)  Your physician wants you to follow-up in: 6 months You will receive a reminder letter in the mail two months in advance. If you don't receive a letter, please call our office to schedule the follow-up appointment.

## 2013-04-18 NOTE — Progress Notes (Signed)
Patient ID: Max Byrd, male   DOB: February 06, 1956, 58 y.o.   MRN: [id number]    [address], [place]  [address] Phone: [phone] Fax:  [phone]  Date:  04/18/2013   ID:  Max Byrd, DOB April 17, 1955, MRN [id number]  PCP:  Wenda Low, MD   ASSESSMENT:  1. Recent aortic valve replacement with bioprosthesis 2. Coronary artery bypass grafting x3, recent 3. Hypertension 4. Hyperlipidemia 5. Erectile dysfunction  PLAN:  1. Clinical followup in 6 months 2. Baseline echocardiogram to be done prior to the office visit to establish baseline function of the bioprosthetic valve 3. Long discussion concerning the use of phosphodiesterase 5 inhibitors for erectile dysfunction and nitroglycerin. He is free to use these medications and now understands that he cannot use a little nitroglycerin within 24 hours of Viagra or Levitra or within 72 hours of Cialis.   SUBJECTIVE: Max Byrd is a 58 y.o. male who underwent coronary artery bypass and aortic valve replacement in January 2015. Since that time he states that his endurance has been persistently diminished. He has had intermittent chest pain for which he uses nitroglycerin. The chest discomfort is poorly characterized and quite atypical. He can last hours. It is nonexertional in its onset. There are no other associated complaints. He denies orthopnea, PND, and other cardiovascular complaints. He is exercising vigorously and cardiac rehabilitation without limitations.   Wt Readings from Last 3 Encounters:  04/18/13 162 lb 6.4 oz (73.664 kg)  03/17/13 164 lb 8 oz (74.617 kg)  02/28/13 175 lb (79.379 kg)     Past Medical History  Diagnosis Date  . Erectile dysfunction     INJECTIONS OF PROSTAGLANDIN BY UROLGIST  . Chronic kidney disease     STAGE 2  . Renal cell carcinoma of right kidney 2003    H/O RENAL R RENAL CELL CANCER  . H/O Bell's palsy 2010 X2  . Depression   . Anxiety   . Pain, chronic postoperative     S/P LEFT ARM SURGERY  . IBS (irritable bowel syndrome)   . Bicuspid aortic valve     a. 01/2013 s/p AVR - 11mm Edwards 3300 TFX pericardial tissue valve, ser # [id number].  Marland Kitchen Hypertension   . Hyperlipidemia   . CAD (coronary artery disease)     a. 01/2013 CABGx3: LIMA->LAD, VG->Diag, VG->OM (performed @ time of AVR)  . Complication of anesthesia     could not speak after waking one   surgery           Current Outpatient Prescriptions  Medication Sig Dispense Refill  . ALPRAZolam (XANAX) 1 MG tablet Take 1 mg by mouth 4 (four) times daily.      Marland Kitchen aspirin EC 81 MG EC tablet Take 1 tablet (81 mg total) by mouth daily.      . carisoprodol (SOMA) 350 MG tablet Take 350 mg by mouth 2 (two) times daily.      . Diclofenac-Misoprostol (ARTHROTEC) 50-0.2 MG TBEC Take 1 tablet by mouth 2 (two) times daily.      . DULoxetine (CYMBALTA) 30 MG capsule Take 30 mg by mouth 2 (two) times daily.      Marland Kitchen escitalopram (LEXAPRO) 10 MG tablet Take 10 mg by mouth 2 (two) times daily.      . eszopiclone (LUNESTA) 2 MG TABS tablet Take 3 mg by mouth at bedtime. Take immediately before bedtime      . fentaNYL (DURAGESIC - DOSED MCG/HR) 75  MCG/HR Place 75 mcg onto the skin every 3 (three) days.       . hyoscyamine (SYMAX-SR) 0.375 MG 12 hr tablet Take 0.375 mg by mouth 2 (two) times daily.      . irbesartan (AVAPRO) 150 MG tablet Take 150 mg by mouth daily.       . nitroGLYCERIN (NITROSTAT) 0.4 MG SL tablet Place 1 tablet (0.4 mg total) under the tongue every 5 (five) minutes as needed for chest pain.  25 tablet  3  . sildenafil (VIAGRA) 100 MG tablet Take 100 mg by mouth daily as needed for erectile dysfunction.      . Tapentadol HCl (NUCYNTA) 75 MG TABS Take 1 tablet (75 mg total) by mouth 4 (four) times daily.  30 tablet  0  . metoprolol tartrate (LOPRESSOR) 25 MG tablet Take 1 tablet (25 mg total) by mouth 2 (two) times daily.  60 tablet  1  . OxyCODONE HCl ER (OXYCONTIN) 30 MG T12A Take 1 tablet by mouth 3  (three) times daily.  30 each  0   No current facility-administered medications for this visit.    Allergies:    Allergies  Allergen Reactions  . Aspirin Other (See Comments)    H/O BLEEDING ULCER  . Buspirone Other (See Comments)    MEMORY LOSS  . Klonopin [Clonazepam] Other (See Comments)    Erectile dysfunction  . Nsaids Other (See Comments)    Stomach bleeding  . Zoloft [Sertraline Hcl] Other (See Comments)    FELT TERRIBLE  . Reglan [Metoclopramide] Anxiety    EXCITABILITY     Social History:  The patient  reports that he quit smoking about 37 years ago. His smoking use included Cigarettes. He has a 7.5 pack-year smoking history. His smokeless tobacco use includes Snuff. He reports that he does not drink alcohol or use illicit drugs.   ROS:  Please see the history of present illness.   Denies chills, fever, syncope, palpitations, and edema   All other systems reviewed and negative.   OBJECTIVE: VS:  BP 140/102  Pulse 80  Ht 5\' 6"  (1.676 m)  Wt 162 lb 6.4 oz (73.664 kg)  BMI 26.22 kg/m2 Well nourished, well developed, in no acute distress, appears older than stated age HEENT: normal Neck: JVD flat. Carotid bruit absent  Cardiac:  normal S1, S2; RRR; 2/6 systolic murmur at the right upper sternal border and the outflow tract and related to the patient's prosthetic valve Lungs:  clear to auscultation bilaterally, no wheezing, rhonchi or rales Abd: soft, nontender, no hepatomegaly Ext: Edema trace bilateral 2+ and symmetric. Pulses 2+ and symmetric Skin: warm and dry Neuro:  CNs 2-12 intact, no focal abnormalities noted  EKG:  Not repeated       Signed, Illene Labrador III, MD 04/18/2013 1:45 PM

## 2013-04-19 ENCOUNTER — Ambulatory Visit: Payer: Federal, State, Local not specified - PPO | Admitting: Interventional Cardiology

## 2013-04-20 ENCOUNTER — Other Ambulatory Visit: Payer: Self-pay | Admitting: Nurse Practitioner

## 2013-04-20 ENCOUNTER — Encounter: Payer: Self-pay | Admitting: Cardiothoracic Surgery

## 2013-04-20 ENCOUNTER — Ambulatory Visit: Payer: Federal, State, Local not specified - PPO | Admitting: Interventional Cardiology

## 2013-04-22 ENCOUNTER — Other Ambulatory Visit: Payer: Self-pay | Admitting: Nurse Practitioner

## 2013-04-26 ENCOUNTER — Encounter: Payer: Self-pay | Admitting: Cardiothoracic Surgery

## 2013-04-26 ENCOUNTER — Ambulatory Visit (INDEPENDENT_AMBULATORY_CARE_PROVIDER_SITE_OTHER): Payer: Self-pay | Admitting: Cardiothoracic Surgery

## 2013-04-26 VITALS — BP 130/84 | HR 77 | Resp 16 | Ht 66.0 in | Wt 158.0 lb

## 2013-04-26 DIAGNOSIS — Q2381 Bicuspid aortic valve: Secondary | ICD-10-CM

## 2013-04-26 DIAGNOSIS — I350 Nonrheumatic aortic (valve) stenosis: Secondary | ICD-10-CM

## 2013-04-26 DIAGNOSIS — Z952 Presence of prosthetic heart valve: Secondary | ICD-10-CM

## 2013-04-26 DIAGNOSIS — I2510 Atherosclerotic heart disease of native coronary artery without angina pectoris: Secondary | ICD-10-CM

## 2013-04-26 DIAGNOSIS — G894 Chronic pain syndrome: Secondary | ICD-10-CM

## 2013-04-26 DIAGNOSIS — Z951 Presence of aortocoronary bypass graft: Secondary | ICD-10-CM

## 2013-04-26 DIAGNOSIS — Z954 Presence of other heart-valve replacement: Secondary | ICD-10-CM

## 2013-04-26 DIAGNOSIS — Q231 Congenital insufficiency of aortic valve: Secondary | ICD-10-CM

## 2013-04-26 DIAGNOSIS — I359 Nonrheumatic aortic valve disorder, unspecified: Secondary | ICD-10-CM

## 2013-04-26 NOTE — Progress Notes (Signed)
PCP is Wenda Low, MD Referring Provider is Sinclair Grooms, MD  Chief Complaint  Patient presents with  . Routine Post Op    f/u    HPI: Patient returns for routine 3 month followup after aVR-CABG for aortic stenosis. He has a tissue valve. He has a chronic pain syndrome managed at the pain management solution in [facility]-: [phone]. He is doing well and is participating in outpatient cardiac rehabilitation. He has a return to work note for March 26. He is having no recurrent angina or symptoms of CHF does have chest wall pain and discomfort from sternotomy. He is currently taking his chronic pain medications in addition to fentanyl patch for his postoperative pain. He will transition from a fentanyl patch to oral narcotic under the direction of its pain management provider, Izora Gala , PA-C   at the pain management clinic. The surgical incisions are all healed.   Past Medical History  Diagnosis Date  . Erectile dysfunction     INJECTIONS OF PROSTAGLANDIN BY UROLGIST  . Chronic kidney disease     STAGE 2  . Renal cell carcinoma of right kidney 2003    H/O RENAL R RENAL CELL CANCER  . H/O Bell's palsy 2010 X2  . Depression   . Anxiety   . Pain, chronic postoperative     S/P LEFT ARM SURGERY  . IBS (irritable bowel syndrome)   . Bicuspid aortic valve     a. 01/2013 s/p AVR - 36mm Edwards 3300 TFX pericardial tissue valve, ser # [id number].  Marland Kitchen Hypertension   . Hyperlipidemia   . CAD (coronary artery disease)     a. 01/2013 CABGx3: LIMA->LAD, VG->Diag, VG->OM (performed @ time of AVR)  . Complication of anesthesia     could not speak after waking one   surgery           Past Surgical History  Procedure Laterality Date  . Colonoscopy  2008    DIVERTICULOSIS  . Anal sphincterotomy  04/13/03    DR. GRAPEY  . Partial nephrectomy  08/24/02    RIGHT...DR. Risa Grill  . Lateral epicondyle release Left 07/29/07    DR. GRAVES  . Cardiac catheterization    . Tonsillectomy    . Aortic  valve replacement N/A 02/14/2013    Procedure: AORTIC VALVE REPLACEMENT (AVR);  Surgeon: Ivin Poot, MD;  Location: [place];  Service: Open Heart Surgery;  Laterality: N/A;  . Coronary artery bypass graft N/A 02/14/2013    Procedure: CORONARY ARTERY BYPASS GRAFTING (CABG);  Surgeon: Ivin Poot, MD;  Location: [place] [place];  Service: Open Heart Surgery;  Laterality: N/A;  . Intraoperative transesophageal echocardiogram N/A 02/14/2013    Procedure: INTRAOPERATIVE TRANSESOPHAGEAL ECHOCARDIOGRAM;  Surgeon: Ivin Poot, MD;  Location: [place];  Service: Open Heart Surgery;  Laterality: N/A;    Family History  Problem Relation Age of Onset  . Cancer Mother     BREAST/OVARIAN  . Heart disease Father     S/P MI/CABG    Social History History  Substance Use Topics  . Smoking status: Former Smoker -- 1.50 packs/day for 5 years    Types: Cigarettes    Quit date: 01/21/1976  . Smokeless tobacco: Current User    Types: Snuff  . Alcohol Use: No     Comment: 4 BEERS PER WEEK UP UNTIL ABOUT A MONTH AGO    Current Outpatient Prescriptions  Medication Sig Dispense Refill  . ALPRAZolam (XANAX) 1 MG tablet Take 1 mg  by mouth 4 (four) times daily.      Marland Kitchen aspirin EC 81 MG EC tablet Take 1 tablet (81 mg total) by mouth daily.      . carisoprodol (SOMA) 350 MG tablet Take 350 mg by mouth 2 (two) times daily.      . Diclofenac-Misoprostol (ARTHROTEC) 50-0.2 MG TBEC Take 1 tablet by mouth 2 (two) times daily.      . DULoxetine (CYMBALTA) 30 MG capsule Take 30 mg by mouth 2 (two) times daily.      Marland Kitchen escitalopram (LEXAPRO) 10 MG tablet Take 10 mg by mouth 2 (two) times daily.      . eszopiclone (LUNESTA) 2 MG TABS tablet Take 3 mg by mouth at bedtime. Take immediately before bedtime      . fentaNYL (DURAGESIC - DOSED MCG/HR) 75 MCG/HR Place 75 mcg onto the skin every 3 (three) days.       . hyoscyamine (SYMAX-SR) 0.375 MG 12 hr tablet Take 0.375 mg by mouth 2 (two) times daily.      . irbesartan  (AVAPRO) 150 MG tablet Take 150 mg by mouth daily.       . metoprolol tartrate (LOPRESSOR) 25 MG tablet Take 1 tablet (25 mg total) by mouth 2 (two) times daily.  60 tablet  1  . NITROSTAT 0.4 MG SL tablet PLACE 1 TABLET UNDER TONGUE EVERY 5 MINUTES AS NEEDED FOR CHEST PAIN  25 tablet  3  . sildenafil (VIAGRA) 100 MG tablet Take 100 mg by mouth daily as needed for erectile dysfunction.      . Tapentadol HCl (NUCYNTA) 75 MG TABS Take 1 tablet (75 mg total) by mouth 4 (four) times daily.  30 tablet  0   No current facility-administered medications for this visit.    Allergies  Allergen Reactions  . Aspirin Other (See Comments)    H/O BLEEDING ULCER  . Buspirone Other (See Comments)    MEMORY LOSS  . Klonopin [Clonazepam] Other (See Comments)    Erectile dysfunction  . Nsaids Other (See Comments)    Stomach bleeding  . Zoloft [Sertraline Hcl] Other (See Comments)    FELT TERRIBLE  . Reglan [Metoclopramide] Anxiety    EXCITABILITY     Review of Systems making excellent postoperative progress. He knows not to lift more than 20 pounds until 3 months after surgery.  BP 130/84  Pulse 77  Resp 16  Ht 5\' 6"  (1.676 m)  Wt 158 lb (71.668 kg)  BMI 25.51 kg/m2  SpO2 97% Physical Exam Alert and comfortable Lungs clear Heart rhythm regular, soft systolic flow murmur through bioprosthetic valve No edema Incision is well-healed  Diagnostic Tests:  No x-rays today Impression: Excellent recovery 3 months postop. AVR-CABG  Plan: We'll plan on seeing the patient 6 months postop to monitor progress He'll need a lipid profile and lipid therapy if indicated by his primary physician

## 2013-05-08 DIAGNOSIS — F4542 Pain disorder with related psychological factors: Secondary | ICD-10-CM | POA: Insufficient documentation

## 2013-06-05 ENCOUNTER — Telehealth: Payer: Self-pay | Admitting: Interventional Cardiology

## 2013-06-05 NOTE — Telephone Encounter (Signed)
New problem    Pt has some questions about where he should be at this time in his recovery.  After his trip by pass and aortic replacement surgery was 4 months ago.   Please give him a call back.

## 2013-06-06 ENCOUNTER — Ambulatory Visit
Admission: RE | Admit: 2013-06-06 | Discharge: 2013-06-06 | Disposition: A | Discharge status: Home / Self Care | Payer: Federal, State, Local not specified - PPO | Source: Ambulatory Visit | Attending: Internal Medicine | Admitting: Internal Medicine

## 2013-06-06 ENCOUNTER — Other Ambulatory Visit: Payer: Self-pay | Admitting: Internal Medicine

## 2013-06-06 DIAGNOSIS — R0602 Shortness of breath: Secondary | ICD-10-CM

## 2013-06-06 NOTE — Telephone Encounter (Signed)
returned call to pt.adv them that 2 providers in the office feel that pt can be seen tomorrow when Dr.Smith returns to the office.pt and pt wife were both on the phone they were both very angry.pt sts that he has had nausea and is weak, pt sts that he will be seen today by his pcp.offered pt again an appt with Dr.Smith in the morning.he refused he said he was dissatisfied with our service and will f/u with Dr.Vantright office.pt insist I tell him how far along he should be in his recovery, adv pt that I thought that questions should be answered by a physician, and again offered an appt with Dr.Smith to discuss.pt sts he is wasting time talking to me, he is hanging up and will call back if he thinks he needs Dr.Smith

## 2013-06-06 NOTE — Telephone Encounter (Signed)
Follow up     Wife says husband needs to be seen today.  This morning, pt is very weak, clamey, weak and diarrhea.

## 2013-06-06 NOTE — Telephone Encounter (Signed)
returned pt wife call.pt wife sts that pt was ref to pain management and has been on a fetanyl it was d/c and he was having some withdrawal. he was started on oxycotin 20mg  daily and it relieved the withdrawal symptoms.pt wfe is upset and insist pt be seen today.pt has no acute cardiac symptoms.ches tpain, swelling, sob. She sts that pt has some exertional dyspnea that is not new and has been present since pt surgery in Dec 2014.offered an appt with Dr.Smith for 4/22 or 4/23.she refused and insist pt be seen today or she will walk in.adv her that pt should f/u with pcp or pain mangement about symptoms.pt wife believes that pt should be further along in his recovery.pffered again appt with Dr.Smith she trefused and wants something done now.adv her I will talk with our office dod physician and call her back.she verbalized understanding.

## 2013-06-22 ENCOUNTER — Other Ambulatory Visit: Payer: Self-pay | Admitting: Internal Medicine

## 2013-06-23 ENCOUNTER — Telehealth: Payer: Self-pay

## 2013-07-03 NOTE — Telephone Encounter (Signed)
Per Dr.Smith ok to auth refill for nitro

## 2013-07-05 ENCOUNTER — Other Ambulatory Visit: Payer: Self-pay

## 2013-07-05 MED ORDER — NITROGLYCERIN 0.4 MG SL SUBL: SUBLINGUAL_TABLET | SUBLINGUAL | Status: DC

## 2013-07-25 ENCOUNTER — Telehealth: Payer: Self-pay | Admitting: Interventional Cardiology

## 2013-07-25 NOTE — Telephone Encounter (Signed)
2nd attempt lmtcb

## 2013-07-25 NOTE — Telephone Encounter (Signed)
returned pt call. lmtcb 

## 2013-07-25 NOTE — Telephone Encounter (Signed)
Patient is 5 months out from surgery.  He's lost 25 pounds since surgery and not gained it back. He has no strength. he's having a ache/dull burning sensation on daily bases. He is taking 1-3 nitroglycerin on a daily bases. This is happening only when he is doing activities. Please call and advise. He can be reached at the work number til 3pm daily.

## 2013-07-25 NOTE — Telephone Encounter (Signed)
pt called back.appt made with Dr.Smith for 6/10 @3 :30pm.pt aware and verbalized understanding.

## 2013-07-26 ENCOUNTER — Ambulatory Visit (INDEPENDENT_AMBULATORY_CARE_PROVIDER_SITE_OTHER): Payer: Federal, State, Local not specified - PPO | Admitting: Interventional Cardiology

## 2013-07-26 ENCOUNTER — Encounter: Payer: Self-pay | Admitting: Interventional Cardiology

## 2013-07-26 ENCOUNTER — Other Ambulatory Visit: Payer: Self-pay | Admitting: Interventional Cardiology

## 2013-07-26 VITALS — BP 121/78 | HR 74 | Ht 66.0 in | Wt 169.8 lb

## 2013-07-26 DIAGNOSIS — I25810 Atherosclerosis of coronary artery bypass graft(s) without angina pectoris: Secondary | ICD-10-CM

## 2013-07-26 DIAGNOSIS — Z952 Presence of prosthetic heart valve: Secondary | ICD-10-CM

## 2013-07-26 DIAGNOSIS — Z01812 Encounter for preprocedural laboratory examination: Secondary | ICD-10-CM

## 2013-07-26 DIAGNOSIS — I209 Angina pectoris, unspecified: Secondary | ICD-10-CM

## 2013-07-26 DIAGNOSIS — I10 Essential (primary) hypertension: Secondary | ICD-10-CM

## 2013-07-26 DIAGNOSIS — Z954 Presence of other heart-valve replacement: Secondary | ICD-10-CM

## 2013-07-26 DIAGNOSIS — Z951 Presence of aortocoronary bypass graft: Secondary | ICD-10-CM

## 2013-07-26 NOTE — Progress Notes (Signed)
Patient ID: Max Byrd, male   DOB: [birth year], 58 y.o.   MRN: [id number]    [address], [address] [place], [place]  [address] Phone: [phone] Fax:  [phone]  Date:  07/26/2013   ID:  Max Byrd, DOB [birth year], MRN [id number]  PCP:  Wenda Low, MD   ASSESSMENT:  1. Class III angina pectoris. The patient is unable to walk to his mailbox or to carry his garbage out without getting burning discomfort that requires nitroglycerin to control 2. Coronary artery bypass grafting with SVG to OM, SVG to diagonal, and LIMA to LAD, January 2015 3. Status post aortic valve replacement for aortic stenosis, January 2015  PLAN:  1. Left heart catheterization, coronary angiography, bypass graft angiography, and possible PCI as soon as possible. The main concern is that of saphenous vein graft occlusion or progression of native vessel disease. 2. The procedure and risks were discussed with the patient in detail and except for    SUBJECTIVE: Max Byrd is a 58 y.o. male who underwent coronary artery bypass grafting at the same time as aortic valve replacement using a Bioprosthesis in 2015, January. He completed cardiac rehabilitation. He was billing his stress. He denied angina while in rehabilitation. Over the past 2-3 months he has begun noticing burning chest pressure with activity such as taking the garbage out or try next size. If he stops and rests the discomfort will gradually ease. He uses nitroglycerin discomfort goes away within 3-5 minutes. This is impairing his ability to be active. The symptoms appear to be progressive.   Wt Readings from Last 3 Encounters:  07/26/13 169 lb 12.8 oz (77.021 kg)  04/26/13 158 lb (71.668 kg)  04/18/13 162 lb 6.4 oz (73.664 kg)     Past Medical History  Diagnosis Date  . Erectile dysfunction     INJECTIONS OF PROSTAGLANDIN BY UROLGIST  . Chronic kidney disease     STAGE 2  . Renal cell carcinoma of right kidney 2003    H/O RENAL R RENAL  CELL CANCER  . H/O Bell's palsy 2010 X2  . Depression   . Anxiety   . Pain, chronic postoperative     S/P LEFT ARM SURGERY  . IBS (irritable bowel syndrome)   . Bicuspid aortic valve     a. 01/2013 s/p AVR - 11mm Edwards 3300 TFX pericardial tissue valve, ser # [id number].  Marland Kitchen Hypertension   . Hyperlipidemia   . CAD (coronary artery disease)     a. 01/2013 CABGx3: LIMA->LAD, VG->Diag, VG->OM (performed @ time of AVR)  . Complication of anesthesia     could not speak after waking one   surgery           Current Outpatient Prescriptions  Medication Sig Dispense Refill  . ALPRAZolam (XANAX) 1 MG tablet Take 1 mg by mouth 4 (four) times daily.      Marland Kitchen aspirin EC 81 MG EC tablet Take 1 tablet (81 mg total) by mouth daily.      . carisoprodol (SOMA) 350 MG tablet Take 350 mg by mouth 2 (two) times daily.      . Diclofenac-Misoprostol (ARTHROTEC) 50-0.2 MG TBEC Take 1 tablet by mouth 2 (two) times daily.      . DULoxetine (CYMBALTA) 30 MG capsule Take 30 mg by mouth 2 (two) times daily.      Marland Kitchen escitalopram (LEXAPRO) 10 MG tablet Take 10 mg by mouth 2 (two) times daily.      Marland Kitchen  eszopiclone (LUNESTA) 2 MG TABS tablet Take 3 mg by mouth at bedtime. Take immediately before bedtime      . irbesartan (AVAPRO) 150 MG tablet Take 150 mg by mouth daily.       . metroNIDAZOLE (FLAGYL) 500 MG tablet       . nitroGLYCERIN (NITROSTAT) 0.4 MG SL tablet PLACE 1 TABLET UNDER TONGUE EVERY 5 MINUTES AS NEEDED FOR CHEST PAIN  25 tablet  3  . OXYCONTIN 30 MG T12A       . sildenafil (VIAGRA) 100 MG tablet Take 100 mg by mouth daily as needed for erectile dysfunction.      . Tapentadol HCl (NUCYNTA) 75 MG TABS Take 1 tablet (75 mg total) by mouth 4 (four) times daily.  30 tablet  0   No current facility-administered medications for this visit.    Allergies:    Allergies  Allergen Reactions  . Aspirin Other (See Comments)    H/O BLEEDING ULCER  . Buspirone Other (See Comments)    MEMORY LOSS  . Klonopin  [Clonazepam] Other (See Comments)    Erectile dysfunction  . Nsaids Other (See Comments)    Stomach bleeding  . Zoloft [Sertraline Hcl] Other (See Comments)    FELT TERRIBLE  . Reglan [Metoclopramide] Anxiety    EXCITABILITY     Social History:  The patient  reports that he quit smoking about 37 years ago. His smoking use included Cigarettes. He has a 7.5 pack-year smoking history. His smokeless tobacco use includes Snuff. He reports that he does not drink alcohol or use illicit drugs.   ROS:  Please see the history of present illness.   Patient had chronic musculoskeletal pain after surgery. He still complains of right shoulder discomfort. His current complaints though have developed within the last 2 months and are progressive.   All other systems reviewed and negative.   OBJECTIVE: VS:  BP 121/78  Pulse 74  Ht 5\' 6"  (1.676 m)  Wt 169 lb 12.8 oz (77.021 kg)  BMI 27.42 kg/m2 Well nourished, well developed, in no acute distress, appears older than stated age 55: normal Neck: JVD flat. Carotid bruit absent  Cardiac:  normal S1, S2; RRR; 1-2/2 systolic murmur at right upper sternal border Lungs:  clear to auscultation bilaterally, no wheezing, rhonchi or rales Abd: soft, nontender, no hepatomegaly Ext: Edema absent. Pulses 2+ Skin: warm and dry Neuro:  CNs 2-12 intact, no focal abnormalities noted  EKG:  Left anterior hemiblock, normal sinus rhythm       Signed, Illene Labrador III, MD 07/26/2013 3:56 PM

## 2013-07-26 NOTE — Patient Instructions (Signed)
Your physician recommends that you continue on your current medications as directed. Please refer to the Current Medication list given to you today.  Lab Today: Bmet,Cbc, Pt/Inr  Your physician has requested that you have a cardiac catheterization. Cardiac catheterization is used to diagnose and/or treat various heart conditions. Doctors may recommend this procedure for a number of different reasons. The most common reason is to evaluate chest pain. Chest pain can be a symptom of coronary artery disease (CAD), and cardiac catheterization can show whether plaque is narrowing or blocking your heart's arteries. This procedure is also used to evaluate the valves, as well as measure the blood flow and oxygen levels in different parts of your heart. For further information please visit www.cardiosmart.org. Please follow instruction sheet, as given.   

## 2013-07-27 ENCOUNTER — Telehealth: Payer: Self-pay | Admitting: Interventional Cardiology

## 2013-07-27 LAB — CBC WITH DIFFERENTIAL/PLATELET
BASOS PCT: 0.7 % (ref 0.0–3.0)
Basophils Absolute: 0 10*3/uL (ref 0.0–0.1)
EOS ABS: 0.3 10*3/uL (ref 0.0–0.7)
EOS PCT: 6.2 % — AB (ref 0.0–5.0)
HCT: 42.1 % (ref 39.0–52.0)
HEMOGLOBIN: 14.5 g/dL (ref 13.0–17.0)
LYMPHS PCT: 22.7 % (ref 12.0–46.0)
Lymphs Abs: 1.2 10*3/uL (ref 0.7–4.0)
MCHC: 34.5 g/dL (ref 30.0–36.0)
MCV: 91.3 fl (ref 78.0–100.0)
MONOS PCT: 5.1 % (ref 3.0–12.0)
Monocytes Absolute: 0.3 10*3/uL (ref 0.1–1.0)
NEUTROS ABS: 3.5 10*3/uL (ref 1.4–7.7)
Neutrophils Relative %: 65.3 % (ref 43.0–77.0)
Platelets: 151 10*3/uL (ref 150.0–400.0)
RBC: 4.61 Mil/uL (ref 4.22–5.81)
RDW: 16.1 % — ABNORMAL HIGH (ref 11.5–15.5)
WBC: 5.3 10*3/uL (ref 4.0–10.5)

## 2013-07-27 LAB — BASIC METABOLIC PANEL
BUN: 28 mg/dL — ABNORMAL HIGH (ref 6–23)
CALCIUM: 8.8 mg/dL (ref 8.4–10.5)
CO2: 31 meq/L (ref 19–32)
Chloride: 104 mEq/L (ref 96–112)
Creatinine, Ser: 1 mg/dL (ref 0.4–1.5)
GFR: 82.57 mL/min (ref >60.00)
Glucose, Bld: 82 mg/dL (ref 70–99)
Potassium: 4.7 mEq/L (ref 3.5–5.1)
SODIUM: 139 meq/L (ref 135–145)

## 2013-07-27 LAB — PROTIME-INR
INR: 1 ratio (ref 0.8–1.0)
PROTHROMBIN TIME: 11.5 s (ref 9.6–13.1)

## 2013-07-27 NOTE — Telephone Encounter (Signed)
New message    Patient calling need to ask couple of questions.

## 2013-07-27 NOTE — Telephone Encounter (Signed)
returned pt call.comf[facility] with [facility]pt wanted to know how many days he would need to take off from work.adv pt if cath is normal he should be able to return to work on [employer]. pt is off on the weekend. Adv pt that if intervention is required he will be instructed on when he can return to work. Pt verbalized understanding

## 2013-07-28 ENCOUNTER — Encounter (HOSPITAL_COMMUNITY): Payer: Self-pay | Admitting: *Deleted

## 2013-07-28 ENCOUNTER — Encounter (HOSPITAL_COMMUNITY)
Admission: RE | Disposition: A | Discharge status: Home / Self Care | Payer: Federal, State, Local not specified - PPO | Source: Ambulatory Visit | Attending: Interventional Cardiology

## 2013-07-28 ENCOUNTER — Ambulatory Visit (HOSPITAL_COMMUNITY)
Admission: RE | Admit: 2013-07-28 | Discharge: 2013-07-29 | Disposition: A | Discharge status: Home / Self Care | Payer: Federal, State, Local not specified - PPO | Source: Ambulatory Visit | Attending: Interventional Cardiology | Admitting: Interventional Cardiology

## 2013-07-28 DIAGNOSIS — N182 Chronic kidney disease, stage 2 (mild): Secondary | ICD-10-CM | POA: Insufficient documentation

## 2013-07-28 DIAGNOSIS — Z85528 Personal history of other malignant neoplasm of kidney: Secondary | ICD-10-CM | POA: Insufficient documentation

## 2013-07-28 DIAGNOSIS — I10 Essential (primary) hypertension: Secondary | ICD-10-CM | POA: Diagnosis present

## 2013-07-28 DIAGNOSIS — F3289 Other specified depressive episodes: Secondary | ICD-10-CM | POA: Insufficient documentation

## 2013-07-28 DIAGNOSIS — I129 Hypertensive chronic kidney disease with stage 1 through stage 4 chronic kidney disease, or unspecified chronic kidney disease: Secondary | ICD-10-CM | POA: Insufficient documentation

## 2013-07-28 DIAGNOSIS — E785 Hyperlipidemia, unspecified: Secondary | ICD-10-CM

## 2013-07-28 DIAGNOSIS — F329 Major depressive disorder, single episode, unspecified: Secondary | ICD-10-CM | POA: Insufficient documentation

## 2013-07-28 DIAGNOSIS — Z7982 Long term (current) use of aspirin: Secondary | ICD-10-CM | POA: Insufficient documentation

## 2013-07-28 DIAGNOSIS — Z538 Procedure and treatment not carried out for other reasons: Secondary | ICD-10-CM | POA: Insufficient documentation

## 2013-07-28 DIAGNOSIS — Q2381 Bicuspid aortic valve: Secondary | ICD-10-CM

## 2013-07-28 DIAGNOSIS — I200 Unstable angina: Secondary | ICD-10-CM | POA: Insufficient documentation

## 2013-07-28 DIAGNOSIS — I2510 Atherosclerotic heart disease of native coronary artery without angina pectoris: Secondary | ICD-10-CM

## 2013-07-28 DIAGNOSIS — Z87891 Personal history of nicotine dependence: Secondary | ICD-10-CM | POA: Insufficient documentation

## 2013-07-28 DIAGNOSIS — F411 Generalized anxiety disorder: Secondary | ICD-10-CM | POA: Insufficient documentation

## 2013-07-28 DIAGNOSIS — I25810 Atherosclerosis of coronary artery bypass graft(s) without angina pectoris: Secondary | ICD-10-CM

## 2013-07-28 DIAGNOSIS — Q231 Congenital insufficiency of aortic valve: Secondary | ICD-10-CM

## 2013-07-28 DIAGNOSIS — N189 Chronic kidney disease, unspecified: Secondary | ICD-10-CM

## 2013-07-28 DIAGNOSIS — I209 Angina pectoris, unspecified: Secondary | ICD-10-CM | POA: Diagnosis present

## 2013-07-28 DIAGNOSIS — Z954 Presence of other heart-valve replacement: Secondary | ICD-10-CM

## 2013-07-28 DIAGNOSIS — N529 Male erectile dysfunction, unspecified: Secondary | ICD-10-CM | POA: Diagnosis present

## 2013-07-28 HISTORY — DX: Chronic kidney disease, stage 2 (mild): N18.2

## 2013-07-28 HISTORY — DX: Atherosclerotic heart disease of native coronary artery without angina pectoris: I25.10

## 2013-07-28 HISTORY — DX: Essential (primary) hypertension: I10

## 2013-07-28 HISTORY — PX: LEFT HEART CATHETERIZATION WITH CORONARY/GRAFT ANGIOGRAM: SHX5450

## 2013-07-28 LAB — POCT ACTIVATED CLOTTING TIME: Activated Clotting Time: 514 seconds

## 2013-07-28 SURGERY — LEFT HEART CATHETERIZATION WITH CORONARY/GRAFT ANGIOGRAM
Anesthesia: LOCAL

## 2013-07-28 MED ORDER — DIPHENHYDRAMINE HCL 50 MG/ML IJ SOLN
INTRAMUSCULAR | Status: AC
  Filled 2013-07-28: qty 1

## 2013-07-28 MED ORDER — SODIUM CHLORIDE 0.9 % IJ SOLN: 3.0000 mL | Freq: Two times a day (BID) | INTRAMUSCULAR | Status: DC

## 2013-07-28 MED ORDER — SODIUM CHLORIDE 0.9 % IJ SOLN
3.0000 mL | INTRAMUSCULAR | Status: DC | PRN
  Administered 2013-07-28: 3 mL via INTRAVENOUS

## 2013-07-28 MED ORDER — SODIUM CHLORIDE 0.9 % IV SOLN: 250.0000 mL | INTRAVENOUS | Status: DC | PRN

## 2013-07-28 MED ORDER — HEPARIN (PORCINE) IN NACL 2-0.9 UNIT/ML-% IJ SOLN
INTRAMUSCULAR | Status: AC
  Filled 2013-07-28: qty 1000

## 2013-07-28 MED ORDER — ISOSORBIDE MONONITRATE ER 30 MG PO TB24
30.0000 mg | ORAL_TABLET | Freq: Every day | ORAL | Status: DC
Start: 2013-07-28 — End: 2013-07-29
  Administered 2013-07-28 – 2013-07-29 (×2): 30 mg via ORAL
  Filled 2013-07-28 (×2): qty 1

## 2013-07-28 MED ORDER — OXYCODONE HCL ER 30 MG PO T12A: 30.0000 mg | EXTENDED_RELEASE_TABLET | Freq: Two times a day (BID) | ORAL | Status: DC

## 2013-07-28 MED ORDER — PRASUGREL HCL 10 MG PO TABS
ORAL_TABLET | ORAL | Status: AC
  Filled 2013-07-28: qty 1

## 2013-07-28 MED ORDER — NITROGLYCERIN IN D5W 200-5 MCG/ML-% IV SOLN
2.0000 ug/min | INTRAVENOUS | Status: DC
  Administered 2013-07-28: 20:00:00 5 ug/min via INTRAVENOUS
  Filled 2013-07-28: qty 250

## 2013-07-28 MED ORDER — AMLODIPINE BESYLATE 5 MG PO TABS
5.0000 mg | ORAL_TABLET | Freq: Every day | ORAL | Status: DC
  Administered 2013-07-28 – 2013-07-29 (×2): 5 mg via ORAL
  Filled 2013-07-28 (×2): qty 1

## 2013-07-28 MED ORDER — MIDAZOLAM HCL 2 MG/2ML IJ SOLN
INTRAMUSCULAR | Status: AC
  Filled 2013-07-28: qty 2

## 2013-07-28 MED ORDER — ESCITALOPRAM OXALATE 10 MG PO TABS
10.0000 mg | ORAL_TABLET | Freq: Two times a day (BID) | ORAL | Status: DC
  Administered 2013-07-29: 10 mg via ORAL
  Filled 2013-07-28 (×3): qty 1

## 2013-07-28 MED ORDER — HEPARIN (PORCINE) IN NACL 2-0.9 UNIT/ML-% IJ SOLN
INTRAMUSCULAR | Status: AC
  Filled 2013-07-28: qty 500

## 2013-07-28 MED ORDER — NITROGLYCERIN 0.4 MG SL SUBL
0.4000 mg | SUBLINGUAL_TABLET | SUBLINGUAL | Status: DC | PRN
  Filled 2013-07-28: qty 1

## 2013-07-28 MED ORDER — BIVALIRUDIN 250 MG IV SOLR
INTRAVENOUS | Status: AC
  Filled 2013-07-28: qty 250

## 2013-07-28 MED ORDER — OXYCODONE-ACETAMINOPHEN 5-325 MG PO TABS
1.0000 | ORAL_TABLET | ORAL | Status: DC | PRN
  Administered 2013-07-28: 18:00:00 2 via ORAL
  Filled 2013-07-28 (×2): qty 2

## 2013-07-28 MED ORDER — LIDOCAINE HCL (PF) 1 % IJ SOLN
INTRAMUSCULAR | Status: AC
  Filled 2013-07-28: qty 30

## 2013-07-28 MED ORDER — AMLODIPINE BESYLATE 5 MG PO TABS: 5.0000 mg | ORAL_TABLET | Freq: Every day | ORAL | Status: DC

## 2013-07-28 MED ORDER — ZOLPIDEM TARTRATE 5 MG PO TABS
10.0000 mg | ORAL_TABLET | Freq: Every day | ORAL | Status: DC
  Administered 2013-07-28: 21:00:00 10 mg via ORAL
  Filled 2013-07-28: qty 2

## 2013-07-28 MED ORDER — SODIUM CHLORIDE 0.9 % IJ SOLN: 3.0000 mL | INTRAMUSCULAR | Status: DC | PRN

## 2013-07-28 MED ORDER — CARISOPRODOL 350 MG PO TABS
350.0000 mg | ORAL_TABLET | Freq: Two times a day (BID) | ORAL | Status: DC
  Administered 2013-07-28: 350 mg via ORAL
  Filled 2013-07-28 (×2): qty 1

## 2013-07-28 MED ORDER — HYDROMORPHONE HCL PF 1 MG/ML IJ SOLN
INTRAMUSCULAR | Status: AC
  Filled 2013-07-28: qty 1

## 2013-07-28 MED ORDER — HEPARIN SODIUM (PORCINE) 1000 UNIT/ML IJ SOLN
INTRAMUSCULAR | Status: AC
  Filled 2013-07-28: qty 1

## 2013-07-28 MED ORDER — DIAZEPAM 5 MG PO TABS
5.0000 mg | ORAL_TABLET | Freq: Once | ORAL | Status: AC
  Administered 2013-07-28: 5 mg via ORAL

## 2013-07-28 MED ORDER — ALPRAZOLAM 0.5 MG PO TABS
1.0000 mg | ORAL_TABLET | Freq: Four times a day (QID) | ORAL | Status: DC
  Administered 2013-07-28 – 2013-07-29 (×2): 1 mg via ORAL
  Filled 2013-07-28 (×2): qty 2

## 2013-07-28 MED ORDER — SODIUM CHLORIDE 0.9 % IV SOLN
1.0000 mL/kg/h | INTRAVENOUS | Status: AC
Start: 2013-07-28 — End: 2013-07-28

## 2013-07-28 MED ORDER — FENTANYL CITRATE 0.05 MG/ML IJ SOLN
INTRAMUSCULAR | Status: AC
  Filled 2013-07-28: qty 2

## 2013-07-28 MED ORDER — ZOLPIDEM TARTRATE 5 MG PO TABS: 5.0000 mg | ORAL_TABLET | Freq: Every day | ORAL | Status: DC

## 2013-07-28 MED ORDER — OXYCODONE HCL 5 MG PO TABS: 10.0000 mg | ORAL_TABLET | Freq: Four times a day (QID) | ORAL | Status: DC | PRN

## 2013-07-28 MED ORDER — MORPHINE SULFATE 2 MG/ML IJ SOLN
2.0000 mg | INTRAMUSCULAR | Status: DC | PRN
  Administered 2013-07-28: 2 mg via INTRAVENOUS
  Filled 2013-07-28: qty 1

## 2013-07-28 MED ORDER — VERAPAMIL HCL 2.5 MG/ML IV SOLN
INTRAVENOUS | Status: AC
  Filled 2013-07-28: qty 2

## 2013-07-28 MED ORDER — NITROGLYCERIN 0.2 MG/ML ON CALL CATH LAB
INTRAVENOUS | Status: AC
Start: 2013-07-28 — End: 2013-07-28
  Filled 2013-07-28: qty 1

## 2013-07-28 MED ORDER — ASPIRIN 81 MG PO CHEW: 81.0000 mg | CHEWABLE_TABLET | ORAL | Status: DC

## 2013-07-28 MED ORDER — NITROGLYCERIN 0.4 MG SL SUBL: 0.4000 mg | SUBLINGUAL_TABLET | SUBLINGUAL | Status: DC | PRN

## 2013-07-28 MED ORDER — IRBESARTAN 150 MG PO TABS
150.0000 mg | ORAL_TABLET | Freq: Every day | ORAL | Status: DC
  Administered 2013-07-29: 150 mg via ORAL
  Filled 2013-07-28: qty 1

## 2013-07-28 MED ORDER — DIAZEPAM 5 MG PO TABS
ORAL_TABLET | ORAL | Status: AC
  Filled 2013-07-28: qty 1

## 2013-07-28 MED ORDER — ASPIRIN 81 MG PO CHEW
CHEWABLE_TABLET | ORAL | Status: AC
  Filled 2013-07-28: qty 1

## 2013-07-28 MED ORDER — DULOXETINE HCL 30 MG PO CPEP
30.0000 mg | ORAL_CAPSULE | Freq: Two times a day (BID) | ORAL | Status: DC
  Administered 2013-07-29: 30 mg via ORAL
  Filled 2013-07-28 (×3): qty 1

## 2013-07-28 MED ORDER — ISOSORBIDE MONONITRATE ER 30 MG PO TB24: 30.0000 mg | ORAL_TABLET | Freq: Every day | ORAL | Status: DC

## 2013-07-28 MED ORDER — OXYCODONE HCL ER 10 MG PO T12A
30.0000 mg | EXTENDED_RELEASE_TABLET | Freq: Two times a day (BID) | ORAL | Status: DC
  Administered 2013-07-28 – 2013-07-29 (×2): 30 mg via ORAL
  Filled 2013-07-28: qty 3

## 2013-07-28 MED ORDER — SODIUM CHLORIDE 0.9 % IV SOLN
0.2500 mg/kg/h | INTRAVENOUS | Status: AC
  Administered 2013-07-28: 0.25 mg/kg/h via INTRAVENOUS
  Filled 2013-07-28: qty 250

## 2013-07-28 MED ORDER — SODIUM CHLORIDE 0.9 % IV SOLN
INTRAVENOUS | Status: DC
  Administered 2013-07-28: 07:00:00 via INTRAVENOUS

## 2013-07-28 NOTE — Care Management Note (Addendum)
  Page 2 of 2   07/31/2013     5:02:53 PM CARE MANAGEMENT NOTE 07/31/2013  Patient:  Max Byrd, Max Byrd   Account Number:  [id number]  Date Initiated:  07/28/2013  Documentation initiated by:  Mariann Laster  Subjective/Objective Assessment:   Angina     Action/Plan:   CM to follow for dispositon needs   Anticipated DC Date:  07/29/2013   Anticipated DC Plan:  [place]  CM consult  Medication Assistance      Choice offered to / List presented to:             Status of service:  Completed, signed off Medicare Important Message given?   (If response is "NO", the following Medicare IM given date fields will be blank) Date Medicare IM given:   Date Additional Medicare IM given:    Discharge Disposition:  HOME/SELF CARE  Per UR Regulation:    If discussed at Long Length of Stay Meetings, dates discussed:    Comments:  Colbie Sliker RN, BSN, MSHL, CCM  Nurse - Case Manager, (Unit [phone]  07/31/2013 PER CMA ---07/31/2013 0938 by Memory Argue--- S/W FRANCES @ [facility] [place] # [phone] PRASUGREL ( EFFIENT ) 10MG  TABLET  COVER -YES  CO-PAY- 30 %     30 DAYS SUPPLY- $ 85.53     90 DAYS SUPPLY -$ 80.00     MAIL ORDER - $ 80.00 PRIOR APPROVAL - NO PHARMACY- [facility] , [facility] , [facility], [facility]    [facility]  *  PATIENT  NEED TO USE :[facility] *     Arletta Lumadue RN, BSN, MSHL, CCM  Nurse - Case Manager, (Unit [phone]  07/28/2013 Benefits Check: prasugrel (EFFIENT) 10 MG tablet

## 2013-07-28 NOTE — Progress Notes (Signed)
Site area: right groin  Site Prior to Removal:  Level 0  Pressure Applied For 30 MINUTES    Minutes Beginning at 1500  Manual:   yes  Patient Status During Pull:  Stable   Post Pull Groin Site:  Level 0  Post Pull Instructions Given:  yes  Post Pull Pulses Present:  yes  Dressing Applied:  yes  Comments:  Given morphine for pain during pull with good effect  TR BAND REMOVAL  LOCATION:    left radial  DEFLATED PER PROTOCOL:    yes  TIME BAND OFF / DRESSING APPLIED:    1530   SITE UPON ARRIVAL:    Level 0  SITE AFTER BAND REMOVAL:    Level 0  REVERSE ALLEN'S TEST:     positive  CIRCULATION SENSATION AND MOVEMENT:    Within Normal Limits   yes  COMMENTS:   Gauze dressing applied  1800 Right femoral site and Left radial site checked with no change in assessment Dressings dry and intact.

## 2013-07-28 NOTE — Brief Op Note (Signed)
   NAME:  Max Byrd   MRN: [id number] DOB:  Feb 26, 1955   ADMIT DATE: 07/28/2013  Brief CATH Note: 1. Left Heart Catheterization with Native Coronary & Graft Angiography - 5Fr RFA - Exchanged for 6 Fr for attempted PCI ( failed L Radial 38F) 2. Unsuccessful attempt with multiple guide catheters to pass guidewire beyond OM2 Lesion.  Very long, difficult procedure due to Aortic disease -- finally decided to abort attempted PCI.    IMPRESSION: 1. Culprit: 100% AortoOstial SVG-OM-2 with segmental 60-70% mid & 95% focal distal stenosis in previously grafted OM2 (large lateral branch) 2. LAD - essentially occluded with patent LIMA-LAD & SVG-D1 3. Small, non-dominant Native RCA 4. Extremely dilated Aortic Root, Aortic Knob & L SCA - makes proper seating of Guide Catheter extremely difficult.  HEMODYNAMICS  Please see full report to follow. Handwritten note in chart.  Plan:  1. Outpt with extended recovery - bed rest will not be up until ~10 PM.   2.  Initiate Antianginal medications - if Angina persists, could consider another attempt with the knowledge of what did not work today.  3.  Aggressive RF Modification therapy - (No BB b/c Bradycardia) add Amlodipine to ARB & Imdur 4.  Anticipate d/c tomorrow  To allow for monitoring of medical therapy.  Leonie Man, M.D., M.S. Interventional Cardiologist   Pager # [phone] 07/28/2013

## 2013-07-28 NOTE — Progress Notes (Signed)
Patient started on Imdur as ordered, given at 1800 and patient had no complaints of chest pain. 1840 complained of chest pain after observed asking his wife for his pills. Patient stated he had chest pain 8/10 10 minutes earlier and took his own sl nitro. Pain 5/10 now (1840) given sl nitro 0.4mg  and ECG completed. At the time of ECG stated chest pain 3/10. Instructed patient that it is important to let staff know when he has chest pain. Patient resting quietly at 1900. Text to PA regarding above.

## 2013-07-28 NOTE — H&P (View-Only) (Signed)
Patient ID: OLAOLUWA Byrd, male   DOB: 1955-03-29, 58 y.o.   MRN: [id number]    [address], [address] [place], [place]  [address] Phone: [phone] Fax:  [phone]  Date:  07/26/2013   ID:  Max Byrd, DOB June 03, 1955, MRN [id number]  PCP:  Wenda Low, MD   ASSESSMENT:  1. Class III angina pectoris. The patient is unable to walk to his mailbox or to carry his garbage out without getting burning discomfort that requires nitroglycerin to control 2. Coronary artery bypass grafting with SVG to OM, SVG to diagonal, and LIMA to LAD, January 2015 3. Status post aortic valve replacement for aortic stenosis, January 2015  PLAN:  1. Left heart catheterization, coronary angiography, bypass graft angiography, and possible PCI as soon as possible. The main concern is that of saphenous vein graft occlusion or progression of native vessel disease. 2. The procedure and risks were discussed with the patient in detail and except for    SUBJECTIVE: Max Byrd is a 58 y.o. male who underwent coronary artery bypass grafting at the same time as aortic valve replacement using a Bioprosthesis in 2015, January. He completed cardiac rehabilitation. He was billing his stress. He denied angina while in rehabilitation. Over the past 2-3 months he has begun noticing burning chest pressure with activity such as taking the garbage out or try next size. If he stops and rests the discomfort will gradually ease. He uses nitroglycerin discomfort goes away within 3-5 minutes. This is impairing his ability to be active. The symptoms appear to be progressive.   Wt Readings from Last 3 Encounters:  07/26/13 169 lb 12.8 oz (77.021 kg)  04/26/13 158 lb (71.668 kg)  04/18/13 162 lb 6.4 oz (73.664 kg)     Past Medical History  Diagnosis Date  . Erectile dysfunction     INJECTIONS OF PROSTAGLANDIN BY UROLGIST  . Chronic kidney disease     STAGE 2  . Renal cell carcinoma of right kidney 2003    H/O RENAL R RENAL  CELL CANCER  . H/O Bell's palsy 2010 X2  . Depression   . Anxiety   . Pain, chronic postoperative     S/P LEFT ARM SURGERY  . IBS (irritable bowel syndrome)   . Bicuspid aortic valve     a. 01/2013 s/p AVR - 82mm Edwards 3300 TFX pericardial tissue valve, ser # [id number].  Max Byrd Hypertension   . Hyperlipidemia   . CAD (coronary artery disease)     a. 01/2013 CABGx3: LIMA->LAD, VG->Diag, VG->OM (performed @ time of AVR)  . Complication of anesthesia     could not speak after waking one   surgery           Current Outpatient Prescriptions  Medication Sig Dispense Refill  . ALPRAZolam (XANAX) 1 MG tablet Take 1 mg by mouth 4 (four) times daily.      Max Byrd aspirin EC 81 MG EC tablet Take 1 tablet (81 mg total) by mouth daily.      . carisoprodol (SOMA) 350 MG tablet Take 350 mg by mouth 2 (two) times daily.      . Diclofenac-Misoprostol (ARTHROTEC) 50-0.2 MG TBEC Take 1 tablet by mouth 2 (two) times daily.      . DULoxetine (CYMBALTA) 30 MG capsule Take 30 mg by mouth 2 (two) times daily.      Max Byrd escitalopram (LEXAPRO) 10 MG tablet Take 10 mg by mouth 2 (two) times daily.      Max Byrd  eszopiclone (LUNESTA) 2 MG TABS tablet Take 3 mg by mouth at bedtime. Take immediately before bedtime      . irbesartan (AVAPRO) 150 MG tablet Take 150 mg by mouth daily.       . metroNIDAZOLE (FLAGYL) 500 MG tablet       . nitroGLYCERIN (NITROSTAT) 0.4 MG SL tablet PLACE 1 TABLET UNDER TONGUE EVERY 5 MINUTES AS NEEDED FOR CHEST PAIN  25 tablet  3  . OXYCONTIN 30 MG T12A       . sildenafil (VIAGRA) 100 MG tablet Take 100 mg by mouth daily as needed for erectile dysfunction.      . Tapentadol HCl (NUCYNTA) 75 MG TABS Take 1 tablet (75 mg total) by mouth 4 (four) times daily.  30 tablet  0   No current facility-administered medications for this visit.    Allergies:    Allergies  Allergen Reactions  . Aspirin Other (See Comments)    H/O BLEEDING ULCER  . Buspirone Other (See Comments)    MEMORY LOSS  . Klonopin  [Clonazepam] Other (See Comments)    Erectile dysfunction  . Nsaids Other (See Comments)    Stomach bleeding  . Zoloft [Sertraline Hcl] Other (See Comments)    FELT TERRIBLE  . Reglan [Metoclopramide] Anxiety    EXCITABILITY     Social History:  The patient  reports that he quit smoking about 37 years ago. His smoking use included Cigarettes. He has a 7.5 pack-year smoking history. His smokeless tobacco use includes Snuff. He reports that he does not drink alcohol or use illicit drugs.   ROS:  Please see the history of present illness.   Patient had chronic musculoskeletal pain after surgery. He still complains of right shoulder discomfort. His current complaints though have developed within the last 2 months and are progressive.   All other systems reviewed and negative.   OBJECTIVE: VS:  BP 121/78  Pulse 74  Ht 5\' 6"  (1.676 m)  Wt 169 lb 12.8 oz (77.021 kg)  BMI 27.42 kg/m2 Well nourished, well developed, in no acute distress, appears older than stated age 58: normal Neck: JVD flat. Carotid bruit absent  Cardiac:  normal S1, S2; RRR; 5-6/2 systolic murmur at right upper sternal border Lungs:  clear to auscultation bilaterally, no wheezing, rhonchi or rales Abd: soft, nontender, no hepatomegaly Ext: Edema absent. Pulses 2+ Skin: warm and dry Neuro:  CNs 2-12 intact, no focal abnormalities noted  EKG:  Left anterior hemiblock, normal sinus rhythm       Signed, Illene Labrador III, MD 07/26/2013 3:56 PM

## 2013-07-28 NOTE — Interval H&P Note (Signed)
History and Physical Interval Note:  07/28/2013 10:58 AM  Max Byrd  has presented today for surgery, with the diagnosis of unstable angina  The various methods of treatment have been discussed with the patient and family. After consideration of risks, benefits and other options for treatment, the patient has consented to  Procedure(s): LEFT HEART CATHETERIZATION WITH CORONARY/GRAFT ANGIOGRAM (N/A) +/- PCI as a surgical intervention .  The patient's history has been reviewed, patient examined, no change in status, stable for surgery.  I have reviewed the patient's chart and labs.  Questions were answered to the patient's satisfaction.     Max Byrd  .Cath Lab Visit (complete for each Cath Lab visit)  Clinical Evaluation Leading to the Procedure:   ACS: no  Non-ACS:    Anginal Classification: CCS IV  Anti-ischemic medical therapy: Minimal Therapy (1 class of medications)  Non-Invasive Test Results: No non-invasive testing performed  Prior CABG: Previous CABG

## 2013-07-29 ENCOUNTER — Encounter (HOSPITAL_COMMUNITY): Payer: Self-pay | Admitting: Cardiology

## 2013-07-29 DIAGNOSIS — I359 Nonrheumatic aortic valve disorder, unspecified: Secondary | ICD-10-CM

## 2013-07-29 DIAGNOSIS — Z954 Presence of other heart-valve replacement: Secondary | ICD-10-CM

## 2013-07-29 DIAGNOSIS — E785 Hyperlipidemia, unspecified: Secondary | ICD-10-CM

## 2013-07-29 DIAGNOSIS — I10 Essential (primary) hypertension: Secondary | ICD-10-CM

## 2013-07-29 DIAGNOSIS — Q231 Congenital insufficiency of aortic valve: Secondary | ICD-10-CM

## 2013-07-29 DIAGNOSIS — I25810 Atherosclerosis of coronary artery bypass graft(s) without angina pectoris: Secondary | ICD-10-CM

## 2013-07-29 DIAGNOSIS — N189 Chronic kidney disease, unspecified: Secondary | ICD-10-CM

## 2013-07-29 LAB — CBC
HEMATOCRIT: 37.9 % — AB (ref 39.0–52.0)
Hemoglobin: 13.4 g/dL (ref 13.0–17.0)
MCH: 31.7 pg (ref 26.0–34.0)
MCHC: 35.4 g/dL (ref 30.0–36.0)
MCV: 89.6 fL (ref 78.0–100.0)
Platelets: 120 10*3/uL — ABNORMAL LOW (ref 150–400)
RBC: 4.23 MIL/uL (ref 4.22–5.81)
RDW: 14.4 % (ref 11.5–15.5)
WBC: 7.5 10*3/uL (ref 4.0–10.5)

## 2013-07-29 LAB — BASIC METABOLIC PANEL WITH GFR
BUN: 22 mg/dL (ref 6–23)
CO2: 26 meq/L (ref 19–32)
Calcium: 8.7 mg/dL (ref 8.4–10.5)
Chloride: 104 meq/L (ref 96–112)
Creatinine, Ser: 1.17 mg/dL (ref 0.50–1.35)
GFR calc Af Amer: 78 mL/min — ABNORMAL LOW
GFR calc non Af Amer: 68 mL/min — ABNORMAL LOW
Glucose, Bld: 96 mg/dL (ref 70–99)
Potassium: 4.5 meq/L (ref 3.7–5.3)
Sodium: 141 meq/L (ref 137–147)

## 2013-07-29 NOTE — Discharge Instructions (Signed)
DO NOT TAKE VIAGRA WHILE TAKING IMDUR   Angina Pectoris Angina pectoris is extreme discomfort in your chest, neck, or arm. Your doctor may call it just angina. It is caused by a lack of oxygen to your heart wall. It may feel like tightness or heavy pressure. It may feel like a crushing or squeezing pain. Some people say it feels like gas. It may go down your shoulders, back, and arms. Some people have symptoms other than pain. These include:  Tiredness.  Shortness of breath.  Cold sweats.  Feeling sick to your stomach (nausea). There are four types of angina:  Stable angina often lasts the same amount of time each time it happens. Activity, stress, or excitement can bring it on. It often gets better after taking a special medicine called nitroglycerin. This goes under your tongue.  Unstable angina can happen when you are not active or even during sleep. It can suddenly get worse or happen more often. It may not get better after taking the special medicine. It can last up to 30 minutes.  Microvascular angina is more common in women. It may be more severe or last longer than other types.  Prinzmetal angina often happens when you are not active or in the early morning hours. HOME CARE   Only take medicines as told by your doctor.  Stay active or exercise more as told by your doctor.  Limit very hard activity as told by your doctor.  Limit heavy lifting as told by your doctor.  Keep a healthy weight.  Learn about and eat foods that are healthy for your heart.  Do not smoke. GET HELP RIGHT AWAY IF:   You have chest, neck, deep shoulder, or arm pain or discomfort that lasts more than a few minutes.  You have chest, neck, deep shoulder, or arm pain or discomfort that goes away and comes back over and over again.  You have heavy sweating that seems to happen for no reason.  You have shortness of breath or trouble breathing.  Your angina does not get better after a few minutes  of rest.  Your angina does not get better after you take nitroglycerin medicine. These can all be symptoms of a heart attack. Get help right away. Call your local emergency service (911 in [place] Do not  drive yourself to the hospital. Do not  wait to for your symptoms to go away. MAKE SURE YOU:   Understand these instructions.  Will watch your condition.  Will get help right away if you are not doing well or get worse. Document Released: 07/22/2007 Document Revised: 01/20/2012 Document Reviewed: 11/12/2011 [facility] Patient Information 2014 [place], [place].

## 2013-07-29 NOTE — Discharge Summary (Signed)
Please see rounding note as well.

## 2013-07-29 NOTE — CV Procedure (Signed)
CARDIAC CATHETERIZATION AND ATTEMPTED PERCUTANEOUS CORONARY INTERVENTION REPORT  NAME:  Max Byrd   MRN: [id number] DOB:  [birth year]   ADMIT DATE: 07/28/2013 Procedure Date: 07/29/2013  INTERVENTIONAL CARDIOLOGIST: Leonie Man, M.D., MS PRIMARY CARE PROVIDER: Wenda Low, MD PRIMARY CARDIOLOGIST: Daneen Schick, III, M.D.  PATIENT:  Max Byrd is a 58 y.o. male who is status post aVR and CABG with bioprosthetic aortic valve in January 2015.  He had LIMA-LAD, SVG-diagonal and SVG-OM 2. He never really got back to his full baseline post operatively. But over last 2-3 months he has been noticing worsening burning chest pressure with activity such as taking out the garbage. Now the symptoms have Gotten Progressively Worse and with Less Activity concerning for a Crescendo Pattern of Class III Unstable Angina. He was seen by Dr. Tamala Julian in clinic on June 10 and referred for catheterization that was supposed to be on June 19, however the patient presented today instead. I have been asked to proceed with his catheterization to avoid having the patient active return next week.  PRE-OPERATIVE DIAGNOSIS:    Class III Unstable Angina  Known CAD status post CABG  PROCEDURES PERFORMED:    Left Heart Catheterization with Native Coronary Angiography  via Right Common Femoral Artery   Attempted, but unsuccessful PCI on OM 2.  PROCEDURE: The patient was brought to the 2nd [facility] Cardiac Catheterization Lab in the fasting state and prepped and draped in the usual sterile fashion for Left Radial or Right Common Femoral artery access. A modified Allen's test was performed on the left wrist demonstrating excellent collateral flow for radial access.   Sterile technique was used including antiseptics, cap, gloves, gown, hand hygiene, mask and sheet. Skin prep: Chlorhexidine.   Consent: Risks of procedure as well as the alternatives and risks of each were explained to the (patient/caregiver). Consent  for procedure obtained.   Time Out: Verified patient identification, verified procedure, site/side was marked, verified correct patient position, special equipment/implants available, medications/allergies/relevent history reviewed, required imaging and test results available. Performed.  Access:   Left Radial Artery: 6 Fr Sheath -  Seldinger Technique (Angiocath Micropuncture Kit)  Radial Cocktail - 10 mL; IV Heparin - not administered as the catheter did not reach the Ascending aorta  Attempted Radial Catheterization: The JR 4 catheter would not advanced beyond the aortic arch into the ascending aorta due to tortuosity and dilation of the aorta. Therefore the decision was made to convert to femoral access.  Left RadialTR Band applied during femoral artery access, inflations were titrated during the procedure until the band was left without inflation.  Right Common Femoral Artery: 5 Fr sheath --  Fluoroscopically guided Modified Seldinger technique   Left Heart Catheterization: 5 Fr Catheters advanced or exchanged over a J-wire;.  Left Coronary Artery Cineangiography: JL4 Catheter (very difficult engagement of the catheter angled almost vertically into the Left Main) SVG-D1 & SVG-OM Cineangiography: JR 4 Catheter   Right Coronary Artery Cineangiography: AL-1 Catheter  LIMA-LAD Cineangiography: IMA Catheter redirected into Left Subclavian Artery & advanced over wire.  LV Hemodynamics: AL-1 -LV gram not performed-   Sheath will be removed in the post procedure unit with manual pressure for hemostasis.   FINDINGS:  Hemodynamics:   Central Aortic Pressure / Mean: 107/70/86  mmHg  Left Ventricular Pressure / LVEDP: 124/0/16 mmHg   Mild Residual Aortic Valve Stenosis  Left Ventriculography: deferred   Aortic/Subclavian Anatomy:   The aortic root is extremely dilated and configured such  that the heart has almost horizontal well seated prosthetic aortic valve with extremely dilated  sinuses of Valsalva making engagement of coronary ostia very difficult.   The Left Subclavian Artery is extremely tortuous with essentially a S turn prior to giving off the vertebral artery and continuing down into the axilla.  The IMA takeoff is posterior just prior to the final bend.   Coronary Anatomy: Left Dominant   Left Main: Large-caliber, short vessel that bifurcates into the LAD and Circumflex.  The takeoff of the left main has almost vertical with the aorta being horizontal. The takeoff of the circumflex is almost 110 from the LAD. The left main is calcified but angiographically normal.  LAD: Moderate caliber vessel that gives off diffusely diseased diagonal branch for being included in the mid vessel.   D1: Smaller moderate caliber vessel with diffuse disease and evidence of repetitive flow distally to   Left Circumflex: Large-caliber, dominant vessel that gives off 2 very small proximal marginal branches. It then gives off a large caliber OM1 before coursing into the AV groove where it gives off a left posterolateral artery and the posterior descending artery.  There is diffuse mild to moderate disease in the AV groove circumflex distally to the PDA.   OM1: Large-caliber vessel with a almost vertical takeoff from the circumflex that gives off a small branch proximally. It then courses down along the inferolateral wall with the long extensor 60-70% stenosis. The vessel then normalizes until the likely graft anastomosis site where there is a focal 95-99% stenosis. Following this there is post stenotic dilation and the relatively normal vessel distally. The vessel courses at end of apex.    EML:JQGBE caliber, nondominant vessel is diffusely diseased. Very difficult to engage .  Graft Anatomy:  LIMA-LAD: Difficult takeoff, but widely patent graft that reaches the mid LAD. Antegrade flow is relatively normal LAD distally. Minimal retrograde flow noted.    SVG-D1: Small to moderate  caliber graft to a small caliber diagonal vessel. There is mild ostial disease in the graft and otherwise relatively normal.  Minimal retrograde flow up the diagonal. Antegrade flow shows multiple branches of the diagonal but no significant stenosis.  SVG-OM: 100% aorto-stial occlusion  After reviewing the initial angiography, the culprit lesion was thought to be the 95% lesion in the OM branch D2 occlusion of the vein graft. Also considered important with diffuse 60-70% lesions more proximally.Marland Kitchen  Preparation were made to proceed with PCI on this lesion.  Percutaneous Coronary Intervention:  Sheath exchanged for 6 Fr  Guide: multiple different guide catheters including CL 4.0 CL 4.5, Voda Left 4.0&4.5, and JL4 and JL 5, AL-1 and AL-2, XB 3.5, 4.0 and 4.5 Occasionally and guide was seated correctly, but the circumflex proved to be very difficult to advance the guidewire into. At one point a wire was advanced into the LAD with a balloon advanced over that wire to ensure guide seating. However it was impossible to have catheter direct enough coaxially to successfully wire the circumflex. Finally after multiple attempts, it became difficult to advance catheters through the sheath. This point a decision was made to abort the procedure based on fluoroscopy time and extent of contrast as well as sedation medications. I discussed the decision to Dr. Tamala Julian who agreed that we will plan for optimizing medical therapy.   The PCI attempt was then aborted.  MEDICATIONS:  Anesthesia:  Local Lidocaine 2 mL for radial access; 25 mL for femoral access   Sedation:  10  mg IV Versed, 200  mcg IV fentanyl ;  2 mg IV Dilaudid, 25 mg IV Benadryl   Premedication:5 mg oral Valium   Omnipaque Contrast: 240  ml  Anticoagulation:  Angiomax Bolus & drip  Anti-Platelet Agent:  Effient 60 mg   Very complicated, difficult procedure that was ultimately unsuccessful. Over 4 hours of time was spent in the Cath  Lab.   PATIENT DISPOSITION:    The patient was transferred to the PACU holding area in a hemodynamicaly stable, chest pain free condition.  The patient tolerated the procedure well, and there were no complications.  EBL:   < 40  ml  The patient was stable before, during, and after the procedure.  POST-OPERATIVE DIAGNOSIS:    Likely culprit for the patient's unstable angina is a combination 100% occluded SVG-OM with 95% stenosis in the native OM.   Otherwise severe disease in the LAD/T1 and nondominant RCA, with moderate disease in the proximal OM   Widely patent LIMA-LAD and SVG-D1  Mildly elevated LVEDP with mild aortic stenosis by gradient  PLAN OF CARE:  After multiple attempts were made to engage the circumflex artery, the decision made to abort PCI. Plan is to optimize medical therapy by adding nitrates and amlodipine. We are not using beta blocker due to his bradycardia.  Due to the amount of anticoagulation and sedation, it is probably best for the patient to be monitored overnight as an outpatient with extended recovery.  He'll then be monitored next day during inflation to see if any relief of symptoms with the newly added medications.    would anticipate discharge tomorrow with plans to follow up with Dr. Tamala Julian.  If attempt to treat medically her unsuccessful, would consider possibly using right radial access which may give a more direct engagement angle for the left coronary artery.  I spent about 15 minutes explaining the results and plan to the patient and an additional 15-20 minutes with the wife as well. All told I spent a total of 44 hours with the patient and wife.   Leonie Man, M.D., M.S. [facility] [address] [place], [place]  [address]  [phone]  07/29/2013 7:32 AM

## 2013-07-29 NOTE — Progress Notes (Signed)
Nitro drip started at 19:58 at 17mcg, at 22:00 nurse called to room. Pt stated " I do not want this nitro drip,, I do not have chest pain now." Nurse explained to pt the benefits of having nitro drip, pt still refused.  Nitro drip stopped at 22:00, nurse has checked with pt during the night.  Pt still denying chest pain.  Will continue to monitor patient.

## 2013-07-29 NOTE — Progress Notes (Signed)
    Primary cardiologist: Dr. Pernell Dupre  Subjective:   No chest pain or shortness of breath.   Objective:   Temp:  [97.3 F (36.3 C)-98 F (36.7 C)] 97.3 F (36.3 C) (06/13 0420) Pulse Rate:  [55-95] 66 (06/13 0430) Resp:  [17-18] 17 (06/13 0420) BP: (89-177)/(46-114) 89/46 mmHg (06/13 0430) SpO2:  [96 %-100 %] 99 % (06/13 0430) Weight:  [163 lb 9.3 oz (74.2 kg)] 163 lb 9.3 oz (74.2 kg) (06/13 0003) Last BM Date: 07/28/13  Filed Weights   07/28/13 0630 07/29/13 0003  Weight: 162 lb (73.483 kg) 163 lb 9.3 oz (74.2 kg)    Intake/Output Summary (Last 24 hours) at 07/29/13 0738 Last data filed at 07/28/13 2300  Gross per 24 hour  Intake 784.88 ml  Output   1300 ml  Net -515.12 ml    Telemetry: Sinus rhythm.  Exam:  General: Appears comfortable.  Lungs: Clear, nonlabored.  Cardiac: RRR, no gallop.  Extremities: Radial access site stable, dressed.   Lab Results:  Basic Metabolic Panel:  Recent Labs Lab 07/27/13 0734 07/29/13 0310  NA 139 141  K 4.7 4.5  CL 104 104  CO2 31 26  GLUCOSE 82 96  BUN 28* 22  CREATININE 1.0 1.17  CALCIUM 8.8 8.7    CBC:  Recent Labs Lab 07/27/13 0734 07/29/13 0310  WBC 5.3 7.5  HGB 14.5 13.4  HCT 42.1 37.9*  MCV 91.3 89.6  PLT 151.0 120*    ECG: Sinus rhythm with IVCD and leftward axis.   Medications:   Scheduled Medications: . ALPRAZolam  1 mg Oral QID  . amLODipine  5 mg Oral Daily  . carisoprodol  350 mg Oral BID  . DULoxetine  30 mg Oral BID  . escitalopram  10 mg Oral BID  . irbesartan  150 mg Oral Daily  . isosorbide mononitrate  30 mg Oral Daily  . OxyCODONE  30 mg Oral Q12H  . sodium chloride  3 mL Intravenous Q12H  . zolpidem  10 mg Oral QHS      PRN Medications:  sodium chloride, morphine injection, nitroGLYCERIN, oxyCODONE, oxyCODONE-acetaminophen, sodium chloride   Assessment:   1. Multivessel CAD status post CABG including SVG to OM, SVG to diagonal, and LIMA to LAD in January  2015. Cardiac catheterization June 12 demonstrated an occluded SVG to OM with segmental 60-70% and mid 95% stenosis in the OM. Unsuccessful attempt at PCI to the OM noted, plan for medical therapy at this time.  2. History of bicuspid aortic valve with aortic stenosis status post bioprosthetic aVR in January 2015.  3. Hypertension.  4. History of CKD stage II, current creatinine 1.1.   Plan/Discussion:    Reviewed Dr. Allison Quarry cardiac catheterization report from yesterday, discussed the situation with the patient. He would like to go home today, discuss other options with Dr. Tamala Julian. Might be worth considering a repeat attempt at PCI. Antianginal regimen includes Imdur and Norvasc. He is on low-dose aspirin at home (not a true allergy, had prior history of GI bleeding on higher dose as well as with NSAIDs). Arrange followup with Dr. Tamala Julian in the next few weeks.   Max Byrd, M.D., F.A.C.C.

## 2013-07-29 NOTE — Discharge Summary (Signed)
Discharge Summary   Patient ID: Max Byrd, MRN: [id number], DOB/AGE: 08/01/1955 57 y.o.  Admit date: 07/28/2013 Discharge date: 07/29/2013   Primary Care Physician:  UYQIHK,[id number]   Primary Cardiologist:  Dr. Daneen Schick    Reason for Admission:  CCS Class III-IV Angina   Primary Discharge Diagnoses:  Principal Problem:   Angina, class IV Active Problems:   Chronic kidney disease   Erectile dysfunction- Viagra discontinued as Imdur added   Hypertension   Hyperlipidemia   CAD - CABG x 3 with tissue AVR- failed PCI SVG-OM 07/28/13   CAD (coronary artery disease), autologous vein bypass graft     Wt Readings from Last 3 Encounters:  07/29/13 163 lb 9.3 oz (74.2 kg)  07/29/13 163 lb 9.3 oz (74.2 kg)  07/26/13 169 lb 12.8 oz (77.021 kg)    Secondary Discharge Diagnoses:   Past Medical History  Diagnosis Date  . Erectile dysfunction     INJECTIONS OF PROSTAGLANDIN BY UROLGIST  . CKD (chronic kidney disease) stage 2, GFR 60-89 ml/min   . Renal cell carcinoma of right kidney 2003  . H/O Bell's palsy 2010 X2  . Depression   . Anxiety   . Pain, chronic postoperative     S/P LEFT ARM SURGERY  . IBS (irritable bowel syndrome)   . Bicuspid aortic valve     a. 01/2013 s/p AVR - 4mm Edwards 3300 TFX pericardial tissue valve, ser # [id number].  Marland Kitchen Essential hypertension, benign   . Hyperlipidemia   . Coronary atherosclerosis of native coronary artery     a. 01/2013 CABGx3: LIMA->LAD, VG->Diag, VG->OM (performed @ time of AVR),  occluded SVG-OM2 , now with 95% stenosis at the anastomosis site on OM 2 - PCI attempt 07/28/13 unsuccessful - med Rx cont'd       Allergies:    Allergies  Allergen Reactions  . Aspirin Other (See Comments)    H/O BLEEDING ULCER  . Buspirone Other (See Comments)    MEMORY LOSS  . Klonopin [Clonazepam] Other (See Comments)    Erectile dysfunction  . Nsaids Other (See Comments)    Stomach bleeding  . Zoloft [Sertraline Hcl] Other (See Comments)    FELT TERRIBLE  . Reglan [Metoclopramide] Anxiety    EXCITABILITY       Procedures Performed This Admission:    Cardiac Catheterization and Attempted PCI (07/28/13): Aortic/Subclavian Anatomy:  The aortic root is extremely dilated and configured such that the heart has almost horizontal well seated prosthetic aortic valve with extremely dilated sinuses of Valsalva making engagement of coronary ostia very difficult.  The Left Subclavian Artery is extremely tortuous with essentially a S turn prior to giving off the vertebral artery and continuing down into the axilla. The IMA takeoff is posterior just prior to the final bend.  Coronary Anatomy: Left Dominant  Left Main: Large-caliber, short vessel that bifurcates into the LAD and Circumflex. The takeoff of the left main has almost vertical with the aorta being horizontal. The takeoff of the circumflex is almost 110 from the LAD. The left main is calcified but angiographically normal.  LAD: Moderate caliber vessel that gives off diffusely diseased diagonal branch for being included in the mid vessel.  D1: Smaller moderate caliber vessel with diffuse disease and evidence of repetitive flow distally to  Left Circumflex: Large-caliber, dominant vessel that gives off 2 very small proximal marginal branches. It then gives off a large caliber OM1 before coursing into the AV groove where it gives off a left  posterolateral artery and the posterior descending artery. There is diffuse mild to moderate disease in the AV groove circumflex distally to the PDA.  OM1: Large-caliber vessel with a almost vertical takeoff from the circumflex that gives off a small branch proximally. It then courses down along the inferolateral wall with the long extensor 60-70% stenosis. The vessel then normalizes until the likely graft anastomosis site where there is a focal 95-99% stenosis. Following this there is post stenotic dilation and the relatively normal vessel distally. The  vessel courses at end of apex.  KYH:CWCBJ caliber, nondominant vessel is diffusely diseased. Very difficult to engage . Graft Anatomy:  LIMA-LAD: Difficult takeoff, but widely patent graft that reaches the mid LAD. Antegrade flow is relatively normal LAD distally. Minimal retrograde flow noted.  SVG-D1: Small to moderate caliber graft to a small caliber diagonal vessel. There is mild ostial disease in the graft and otherwise relatively normal. Minimal retrograde flow up the diagonal. Antegrade flow shows multiple branches of the diagonal but no significant stenosis.  SVG-OM: 100% aorto-stial occlusion  After reviewing the initial angiography, the culprit lesion was thought to be the 95% lesion in the OM branch D2 occlusion of the vein graft. Also considered important with diffuse 60-70% lesions more proximally. Preparation were made to proceed with PCI on this lesion.  After multiple attempts were made to engage the circumflex artery, the decision made to abort PCI. Plan is to optimize medical therapy by adding nitrates and amlodipine. We are not using beta blocker due to his bradycardia.   Hospital Course:  Max Byrd is a 58 y.o. male with a hx of CAD, bicuspid aortic valve with aortic stenosis s/p CABG + bioprosthetic AVR, CKD, renal CA, HTN, HL.  He was recently seen by Dr. Daneen Schick with symptoms c/w CCS Class 3 angina.   Cardiac cath was arranged.  This was performed yesterday and demonstrated a patent L-LAD, S-D1 and occluded S-OM.  Culprit was felt to be 95% lesion in OM branch b/c of occlusion of SVG.  Multiple attempts were made to engage the CFX artery by Dr. Ellyn Hack.  These were unsuccessful and the PCI was aborted.  Patient was observed overnight.  Creatinine this AM is stable. He was seen by Dr. Rozann Lesches this AM.  He was pain free.  He is felt to be stable for d/c to home.  He needs f/u with Dr. Daneen Schick to consider repeat attempt at PCI of the CFX.  Of note, Imdur and Norvasc  were started this admission as antianginals.  I have discussed with [name] today the importance of not taking Viagra while on Imdur.  He understands the potential interaction.   Discharge Vitals:   Blood pressure 101/65, pulse 80, temperature 97.8 F (36.6 C), temperature source Oral, resp. rate 16, height 5\' 6"  (1.676 m), weight 163 lb 9.3 oz (74.2 kg), SpO2 98.00%.   Labs:   Recent Labs  07/27/13 0734 07/29/13 0310  WBC 5.3 7.5  HGB 14.5 13.4  HCT 42.1 37.9*  MCV 91.3 89.6  PLT 151.0 120*     Recent Labs  07/27/13 0734 07/29/13 0310  NA 139 141  K 4.7 4.5  CL 104 104  CO2 31 26  BUN 28* 22  CREATININE 1.0 1.17  CALCIUM 8.8 8.7     Recent Labs  07/27/13 1007  INR 1.0     Diagnostic Procedures and Studies:  No results found.   Disposition:   Pt is  being discharged home today in good condition.  Follow-up Plans & Appointments      Follow-up Information   Follow up with Sinclair Grooms, MD. (office will call )    Specialty:  Cardiology   Contact information:   [address] [place] [address] [phone]       Discharge Medications    Medication List    STOP taking these medications       sildenafil 100 MG tablet  Commonly known as:  VIAGRA     TYLENOL ALLERGY SINUS PO      TAKE these medications       ALPRAZolam 1 MG tablet  Commonly known as:  XANAX  Take 1 mg by mouth 4 (four) times daily.     amLODipine 5 MG tablet  Commonly known as:  NORVASC  Take 1 tablet (5 mg total) by mouth daily.     ARTHROTEC 50-0.2 MG Tbec  Generic drug:  Diclofenac-Misoprostol  Take 1 tablet by mouth 2 (two) times daily.     aspirin 81 MG EC tablet  Take 1 tablet (81 mg total) by mouth daily.     carisoprodol 350 MG tablet  Commonly known as:  SOMA  Take 350 mg by mouth 2 (two) times daily.     CLEAR EYES OP  Place 1 drop into both eyes daily as needed (dry eyes).     DULoxetine 30 MG capsule  Commonly known  as:  CYMBALTA  Take 30 mg by mouth 2 (two) times daily.     escitalopram 10 MG tablet  Commonly known as:  LEXAPRO  Take 10 mg by mouth 2 (two) times daily.     eszopiclone 3 MG Tabs  Generic drug:  Eszopiclone  Take 3 mg by mouth at bedtime. Take immediately before bedtime     irbesartan 150 MG tablet  Commonly known as:  AVAPRO  Take 150 mg by mouth daily.     isosorbide mononitrate 30 MG 24 hr tablet  Commonly known as:  IMDUR  Take 1 tablet (30 mg total) by mouth daily.     nitroGLYCERIN 0.4 MG SL tablet  Commonly known as:  NITROSTAT  Place 1 tablet (0.4 mg total) under the tongue every 5 (five) minutes as needed for chest pain.     OXYCONTIN 30 MG T12a  Generic drug:  OxyCODONE HCl ER  Take 30 mg by mouth every 12 (twelve) hours.     Tapentadol HCl 75 MG Tabs  Commonly known as:  NUCYNTA  Take 1 tablet (75 mg total) by mouth 4 (four) times daily.         Outstanding Labs/Studies  1. None   Duration of Discharge Encounter: Greater than 30 minutes including physician and PA time.  Signed, Richardson Dopp, PA-C   07/29/2013 8:51 AM

## 2013-07-31 ENCOUNTER — Telehealth: Payer: Self-pay | Admitting: Interventional Cardiology

## 2013-07-31 MED FILL — Sodium Chloride IV Soln 0.9%: INTRAVENOUS | Qty: 50 | Status: AC

## 2013-07-31 NOTE — Telephone Encounter (Signed)
Patients wife called in and is concerned because patient is taking 2 BP meds. She wants her husband to be called TODAY. Please call and advise.

## 2013-07-31 NOTE — Telephone Encounter (Signed)
Dr.Smith aware and will call pt

## 2013-07-31 NOTE — Telephone Encounter (Signed)
returned pt wife call. spoke with pt adv him that Dr.Smith is in clinic seein pt, Dr.Smith will call him this afternoon to adress f/u.pt verbalized understanding

## 2013-07-31 NOTE — Telephone Encounter (Signed)
New message     Pt had an unsuccessfull cath last Friday.  Want to talk to Dr Tamala Julian about "what now".

## 2013-08-03 ENCOUNTER — Telehealth: Payer: Self-pay

## 2013-08-03 NOTE — Telephone Encounter (Signed)
pt aware pci with circumflex stent scheduled for 08/10/13 @ 7:30 with Dr.Smith.pt is a known case.pt given verbal instructions NPO after midnight. arrive @ [facility] at 5:30am. Morning meds with a sip of water.pt agreeable with plan and verbalized understanding.

## 2013-08-08 ENCOUNTER — Telehealth: Payer: Self-pay

## 2013-08-08 ENCOUNTER — Encounter (HOSPITAL_COMMUNITY): Payer: Self-pay | Admitting: Pharmacy Technician

## 2013-08-08 NOTE — Telephone Encounter (Signed)
pt aware pci procedure scheduled with Dr.Smith time has moved to 9am on 08/10/13.pt to report to [place] @7am . pt verbalized understanding.

## 2013-08-09 ENCOUNTER — Other Ambulatory Visit: Payer: Self-pay | Admitting: Interventional Cardiology

## 2013-08-09 DIAGNOSIS — I209 Angina pectoris, unspecified: Secondary | ICD-10-CM

## 2013-08-10 ENCOUNTER — Encounter (HOSPITAL_COMMUNITY): Payer: Self-pay | Admitting: General Practice

## 2013-08-10 ENCOUNTER — Other Ambulatory Visit: Payer: Self-pay

## 2013-08-10 ENCOUNTER — Encounter (HOSPITAL_COMMUNITY)
Admission: RE | Disposition: A | Discharge status: Home / Self Care | Payer: Federal, State, Local not specified - PPO | Source: Ambulatory Visit | Attending: Interventional Cardiology

## 2013-08-10 ENCOUNTER — Ambulatory Visit (HOSPITAL_COMMUNITY)
Admission: RE | Admit: 2013-08-10 | Discharge: 2013-08-11 | Disposition: A | Discharge status: Home / Self Care | Payer: Federal, State, Local not specified - PPO | Source: Ambulatory Visit | Attending: Interventional Cardiology | Admitting: Interventional Cardiology

## 2013-08-10 DIAGNOSIS — I209 Angina pectoris, unspecified: Secondary | ICD-10-CM | POA: Insufficient documentation

## 2013-08-10 DIAGNOSIS — I25709 Atherosclerosis of coronary artery bypass graft(s), unspecified, with unspecified angina pectoris: Secondary | ICD-10-CM

## 2013-08-10 DIAGNOSIS — I129 Hypertensive chronic kidney disease with stage 1 through stage 4 chronic kidney disease, or unspecified chronic kidney disease: Secondary | ICD-10-CM | POA: Insufficient documentation

## 2013-08-10 DIAGNOSIS — I2510 Atherosclerotic heart disease of native coronary artery without angina pectoris: Secondary | ICD-10-CM

## 2013-08-10 DIAGNOSIS — M7918 Myalgia, other site: Secondary | ICD-10-CM | POA: Diagnosis present

## 2013-08-10 DIAGNOSIS — N189 Chronic kidney disease, unspecified: Secondary | ICD-10-CM | POA: Insufficient documentation

## 2013-08-10 DIAGNOSIS — Z8553 Personal history of malignant neoplasm of renal pelvis: Secondary | ICD-10-CM | POA: Insufficient documentation

## 2013-08-10 DIAGNOSIS — E785 Hyperlipidemia, unspecified: Secondary | ICD-10-CM | POA: Insufficient documentation

## 2013-08-10 DIAGNOSIS — Z954 Presence of other heart-valve replacement: Secondary | ICD-10-CM | POA: Insufficient documentation

## 2013-08-10 DIAGNOSIS — D696 Thrombocytopenia, unspecified: Secondary | ICD-10-CM | POA: Insufficient documentation

## 2013-08-10 DIAGNOSIS — I25810 Atherosclerosis of coronary artery bypass graft(s) without angina pectoris: Secondary | ICD-10-CM | POA: Insufficient documentation

## 2013-08-10 HISTORY — DX: Personal history of other diseases of the digestive system: Z87.19

## 2013-08-10 HISTORY — DX: Unspecified osteoarthritis, unspecified site: M19.90

## 2013-08-10 HISTORY — DX: Migraine, unspecified, not intractable, without status migrainosus: G43.909

## 2013-08-10 HISTORY — DX: Personal history of other medical treatment: Z92.89

## 2013-08-10 HISTORY — PX: CORONARY ANGIOPLASTY WITH STENT PLACEMENT: SHX49

## 2013-08-10 HISTORY — DX: Angina pectoris, unspecified: I20.9

## 2013-08-10 HISTORY — PX: PERCUTANEOUS STENT INTERVENTION: SHX5500

## 2013-08-10 HISTORY — DX: Personal history of peptic ulcer disease: Z87.11

## 2013-08-10 LAB — POCT ACTIVATED CLOTTING TIME: Activated Clotting Time: 529 seconds

## 2013-08-10 SURGERY — PERCUTANEOUS STENT INTERVENTION
Anesthesia: LOCAL

## 2013-08-10 MED ORDER — ZOLPIDEM TARTRATE 5 MG PO TABS
10.0000 mg | ORAL_TABLET | Freq: Every evening | ORAL | Status: DC | PRN
Start: 2013-08-10 — End: 2013-08-11
  Administered 2013-08-10: 10 mg via ORAL
  Filled 2013-08-10: qty 2

## 2013-08-10 MED ORDER — ZOLPIDEM TARTRATE 5 MG PO TABS: 5.0000 mg | ORAL_TABLET | Freq: Every evening | ORAL | Status: DC | PRN

## 2013-08-10 MED ORDER — OXYCODONE-ACETAMINOPHEN 5-325 MG PO TABS
1.0000 | ORAL_TABLET | ORAL | Status: DC | PRN
  Administered 2013-08-10 – 2013-08-11 (×2): 2 via ORAL
  Filled 2013-08-10 (×2): qty 2

## 2013-08-10 MED ORDER — MIDAZOLAM HCL 2 MG/2ML IJ SOLN
INTRAMUSCULAR | Status: AC
  Filled 2013-08-10: qty 2

## 2013-08-10 MED ORDER — SODIUM CHLORIDE 0.9 % IV SOLN: 250.0000 mL | INTRAVENOUS | Status: DC | PRN

## 2013-08-10 MED ORDER — FENTANYL CITRATE 0.05 MG/ML IJ SOLN
INTRAMUSCULAR | Status: AC
  Filled 2013-08-10: qty 2

## 2013-08-10 MED ORDER — ESCITALOPRAM OXALATE 10 MG PO TABS
10.0000 mg | ORAL_TABLET | Freq: Two times a day (BID) | ORAL | Status: DC
  Administered 2013-08-10 – 2013-08-11 (×3): 10 mg via ORAL
  Filled 2013-08-10 (×4): qty 1

## 2013-08-10 MED ORDER — CLOPIDOGREL BISULFATE 75 MG PO TABS
300.0000 mg | ORAL_TABLET | ORAL | Status: AC
  Administered 2013-08-10: 300 mg via ORAL
  Filled 2013-08-10: qty 4

## 2013-08-10 MED ORDER — NITROGLYCERIN 0.4 MG SL SUBL: 0.4000 mg | SUBLINGUAL_TABLET | SUBLINGUAL | Status: DC | PRN

## 2013-08-10 MED ORDER — LIDOCAINE HCL (PF) 1 % IJ SOLN
INTRAMUSCULAR | Status: AC
  Filled 2013-08-10: qty 30

## 2013-08-10 MED ORDER — HEPARIN (PORCINE) IN NACL 2-0.9 UNIT/ML-% IJ SOLN
INTRAMUSCULAR | Status: AC
  Filled 2013-08-10: qty 1000

## 2013-08-10 MED ORDER — ONDANSETRON HCL 4 MG/2ML IJ SOLN: 4.0000 mg | Freq: Four times a day (QID) | INTRAMUSCULAR | Status: DC | PRN

## 2013-08-10 MED ORDER — BIVALIRUDIN 250 MG IV SOLR
INTRAVENOUS | Status: AC
  Filled 2013-08-10: qty 250

## 2013-08-10 MED ORDER — OXYCODONE HCL ER 15 MG PO T12A: 30.0000 mg | EXTENDED_RELEASE_TABLET | Freq: Two times a day (BID) | ORAL | Status: DC

## 2013-08-10 MED ORDER — DIAZEPAM 5 MG PO TABS
5.0000 mg | ORAL_TABLET | Freq: Once | ORAL | Status: AC
  Administered 2013-08-10: 5 mg via ORAL
  Filled 2013-08-10: qty 1

## 2013-08-10 MED ORDER — AMLODIPINE BESYLATE 5 MG PO TABS
5.0000 mg | ORAL_TABLET | Freq: Every day | ORAL | Status: DC
  Administered 2013-08-11: 09:00:00 5 mg via ORAL
  Filled 2013-08-10: qty 1

## 2013-08-10 MED ORDER — DULOXETINE HCL 30 MG PO CPEP
30.0000 mg | ORAL_CAPSULE | Freq: Two times a day (BID) | ORAL | Status: DC
  Administered 2013-08-10 – 2013-08-11 (×2): 30 mg via ORAL
  Filled 2013-08-10 (×3): qty 1

## 2013-08-10 MED ORDER — CLOPIDOGREL BISULFATE 75 MG PO TABS
75.0000 mg | ORAL_TABLET | Freq: Every day | ORAL | Status: DC
  Administered 2013-08-11: 09:00:00 75 mg via ORAL
  Filled 2013-08-10: qty 1

## 2013-08-10 MED ORDER — ASPIRIN 81 MG PO CHEW
81.0000 mg | CHEWABLE_TABLET | ORAL | Status: AC
  Administered 2013-08-10: 81 mg via ORAL
  Filled 2013-08-10: qty 1

## 2013-08-10 MED ORDER — ALPRAZOLAM 0.5 MG PO TABS
1.0000 mg | ORAL_TABLET | Freq: Four times a day (QID) | ORAL | Status: DC
  Administered 2013-08-10 – 2013-08-11 (×3): 1 mg via ORAL
  Filled 2013-08-10 (×3): qty 2

## 2013-08-10 MED ORDER — ACETAMINOPHEN 325 MG PO TABS: 650.0000 mg | ORAL_TABLET | ORAL | Status: DC | PRN

## 2013-08-10 MED ORDER — SODIUM CHLORIDE 0.9 % IJ SOLN
3.0000 mL | INTRAMUSCULAR | Status: DC | PRN
  Administered 2013-08-10: 3 mL via INTRAVENOUS

## 2013-08-10 MED ORDER — SODIUM CHLORIDE 0.9 % IV SOLN: INTRAVENOUS | Status: AC

## 2013-08-10 MED ORDER — NITROGLYCERIN 0.2 MG/ML ON CALL CATH LAB
INTRAVENOUS | Status: AC
  Filled 2013-08-10: qty 1

## 2013-08-10 MED ORDER — SODIUM CHLORIDE 0.9 % IV SOLN
INTRAVENOUS | Status: DC
  Administered 2013-08-10: 08:00:00 via INTRAVENOUS

## 2013-08-10 MED ORDER — SODIUM CHLORIDE 0.9 % IJ SOLN: 3.0000 mL | Freq: Two times a day (BID) | INTRAMUSCULAR | Status: DC

## 2013-08-10 MED ORDER — ASPIRIN EC 81 MG PO TBEC
81.0000 mg | DELAYED_RELEASE_TABLET | Freq: Every day | ORAL | Status: DC
  Administered 2013-08-11: 09:00:00 81 mg via ORAL
  Filled 2013-08-10: qty 1

## 2013-08-10 MED ORDER — SODIUM CHLORIDE 0.9 % IV SOLN
1.7500 mg/kg/h | INTRAVENOUS | Status: DC
  Administered 2013-08-10: 1.75 mg/kg/h via INTRAVENOUS
  Filled 2013-08-10: qty 250

## 2013-08-10 MED ORDER — OXYCODONE HCL 5 MG PO TABS
10.0000 mg | ORAL_TABLET | ORAL | Status: DC | PRN
  Administered 2013-08-10: 12:00:00 10 mg via ORAL
  Filled 2013-08-10: qty 2

## 2013-08-10 MED ORDER — CARISOPRODOL 350 MG PO TABS
350.0000 mg | ORAL_TABLET | Freq: Two times a day (BID) | ORAL | Status: DC
  Administered 2013-08-10 – 2013-08-11 (×3): 350 mg via ORAL
  Filled 2013-08-10 (×3): qty 1

## 2013-08-10 MED ORDER — OXYCODONE HCL ER 10 MG PO T12A
30.0000 mg | EXTENDED_RELEASE_TABLET | ORAL | Status: DC
  Administered 2013-08-10 – 2013-08-11 (×2): 30 mg via ORAL
  Filled 2013-08-10 (×2): qty 3

## 2013-08-10 NOTE — CV Procedure (Signed)
PERCUTANEOUS CORONARY INTERVENTION   Max Byrd is a 58 y.o. male  Primary Physician: Wenda Low, M.D.  INDICATION: Class III-class IV angina pectoris 3 months status post coronary artery bypass grafting with documented bypass graft J. to the obtuse marginal. Prior prolonged failed angioplasty attempt 2 weeks ago. Special or equipment to compensate for the patient's abnormal aortic size and anatomy.   PROCEDURE: 1. Prolonged complicated PCI distal obtuse marginal via the right femoral artery; 2. DES OM #1   CONSENT: The risks, benefits, and details of the procedure were explained to the patient. Risks including death, MI, stroke, bleeding, limb ischemia, emergency CABG, renal failure and allergy were described and accepted by the patient.  Informed written consent was obtained prior to proceeding.  PROCEDURE TECHNIQUE:  After Xylocaine anesthesia a 6 [place] and eventually upgraded to a 7 [place] sheath was placed in the right femoral artery with a single anterior needle wall stick.   Coronary guiding shots were made using multiple catheters including: A special order Amplatz left 5 cm, CLS 5 cm, Voda 5 cm, and ultimately a 4.0 cm XB LAD. The Amplatz left 5 cm nicely cannulated the LAD but this catheter was 120 cm in length (see below).    Antithrombotic therapy, bivalirudin bolus and infusion, was begun and determined to be therapeutic by ACT. Antiplatelet therapy, Plavix 300 mg 4 hours prior to the procedure, was loaded.  Starting with the Amplatz left 5 cm catheter, a great deal of difficulty was encountered cannulating the left main. The Amplatz required that the guidewire be curled/ placed in the sinus of Valsalva and Amplatz then pushed forward, removing the guidewire, then allowing the Amplatz to fall  into the left sinus of Valsalva. This catheter then needed to be actively advanced into a superior takeoff of the left main. With difficulty this was achieved. The catheter was  then slightly retracted and we were eventually able to place a guidewire distal to the stenosis in the obtuse marginal. Unfortunately, the Amplatz catheter was 120 cm in length. I standard balloon and stent catheters are 100 cm in length. The balloon could not reach the stenosis in the midportion of the obtuse marginal.  Distended cause [place] to have to go back to standard length catheters that we had available in the laboratory. After going through a sequence of catheter as noted above and XB LAD 4 cm catheter cannulated the left main and allowed [place] to position such that we could wire the circumflex. We then used a 3.0 x 15 mm long balloon to predilate. We were then able to positioned and deployed a 3.0 x 18 mm long Xience Alpine. The stent was deployed at 14 atmospheres. We then post dilated using an 8 mm long by 3.5 cm [place] balloon. Overlapping inflations from the distal margin to the proximal third of the stent were performed to 14 atmospheres. This was an attempt to flare the stent to appropriately sized the region of poststenotic dilatation in the mid circumflex. TIMI grade 3 flow was noted. Intracoronary nitroglycerin was administered. Post procedural angiographic images revealed a very nice result. The case was terminated patient stable.  An 8 [place] Angio-Seal was used to achieve hemostasis without complications.   EQUIPMENT: Multiple guide catheters as mentioned above. Pro-water Guidewire guidewire, 190 and 300 cm in length.   CONTRAST:  Total of 200  cc.  COMPLICATIONS:  Prolonged procedure and do to distorted angulation of the ACE and aorta arising horizontal in  the mediastinum with superior margin of the left main from the aorta. Case was extremely complicated related to the anatomic distortions of the presence of a bioprosthetic aortic valve that prevented typical catheter torque..    ANGIOGRAPHIC RESULTS:   99% mid obtuse marginal stenosis reduced to 0% with TIMI grade 3 flow   IMPRESSIONS:   Successful, complicated, prolonged PCI of the first obtuse marginal with reduction and 99% stenosis to 0% with TIMI grade 3 flow   RECOMMENDATION:  Plavix and aspirin for one year Discharge in a.m..    Sinclair Grooms, MD 08/10/2013 11:39 AM

## 2013-08-11 LAB — CBC
HCT: 37.1 % — ABNORMAL LOW (ref 39.0–52.0)
Hemoglobin: 13.2 g/dL (ref 13.0–17.0)
MCH: 32 pg (ref 26.0–34.0)
MCHC: 35.6 g/dL (ref 30.0–36.0)
MCV: 90 fL (ref 78.0–100.0)
PLATELETS: 128 10*3/uL — AB (ref 150–400)
RBC: 4.12 MIL/uL — ABNORMAL LOW (ref 4.22–5.81)
RDW: 14.3 % (ref 11.5–15.5)
WBC: 5.3 10*3/uL (ref 4.0–10.5)

## 2013-08-11 LAB — BASIC METABOLIC PANEL
BUN: 23 mg/dL (ref 6–23)
CALCIUM: 8.7 mg/dL (ref 8.4–10.5)
CO2: 27 mEq/L (ref 19–32)
CREATININE: 1.06 mg/dL (ref 0.50–1.35)
Chloride: 103 mEq/L (ref 96–112)
GFR calc non Af Amer: 76 mL/min — ABNORMAL LOW (ref >90)
GFR, EST AFRICAN AMERICAN: 88 mL/min — AB (ref >90)
Glucose, Bld: 93 mg/dL (ref 70–99)
Potassium: 4 mEq/L (ref 3.7–5.3)
Sodium: 142 mEq/L (ref 137–147)

## 2013-08-11 MED ORDER — PANTOPRAZOLE SODIUM 40 MG PO TBEC: 40.0000 mg | DELAYED_RELEASE_TABLET | Freq: Every day | ORAL | Status: DC

## 2013-08-11 MED ORDER — CLOPIDOGREL BISULFATE 75 MG PO TABS: 75.0000 mg | ORAL_TABLET | Freq: Every day | ORAL | Status: DC

## 2013-08-11 MED FILL — Sodium Chloride IV Soln 0.9%: INTRAVENOUS | Qty: 50 | Status: AC

## 2013-08-11 NOTE — Progress Notes (Signed)
Ready for discharge,.

## 2013-08-11 NOTE — Discharge Summary (Signed)
The patient has ambulated vigorously in the hall and has absolutely no angina pectoris. He will be discharged on dual antiplatelet therapy. Isosorbide mononitrate we'll not be resumed. He will followup in 7-10 days. He will call and.

## 2013-08-11 NOTE — Discharge Instructions (Signed)
PLEASE REMEMBER TO BRING ALL OF YOUR MEDICATIONS TO EACH OF YOUR FOLLOW-UP OFFICE VISITS. ° °PLEASE ATTEND ALL SCHEDULED FOLLOW-UP APPOINTMENTS.  ° °Activity: Increase activity slowly as tolerated. You may shower, but no soaking baths (or swimming) for 1 week. No driving for 2 days. No lifting over 5 lbs for 1 week. No sexual activity for 1 week.  ° °You May Return to Work: in 1 week (if applicable) ° °Wound Care: You may wash cath site gently with soap and water. Keep cath site clean and dry. If you notice pain, swelling, bleeding or pus at your cath site, please call [phone]. ° ° ° °Cardiac Cath Site Care °Refer to this sheet in the next few weeks. These instructions provide you with information on caring for yourself after your procedure. Your caregiver may also give you more specific instructions. Your treatment has been planned according to current medical practices, but problems sometimes occur. Call your caregiver if you have any problems or questions after your procedure. °HOME CARE INSTRUCTIONS °· You may shower 24 hours after the procedure. Remove the bandage (dressing) and gently wash the site with plain soap and water. Gently pat the site dry.  °· Do not apply powder or lotion to the site.  °· Do not sit in a bathtub, swimming pool, or whirlpool for 5 to 7 days.  °· No bending, squatting, or lifting anything over 10 pounds (4.5 kg) as directed by your caregiver.  °· Inspect the site at least twice daily.  °· Do not drive home if you are discharged the same day of the procedure. Have someone else drive you.  °· You may drive 24 hours after the procedure unless otherwise instructed by your caregiver.  °What to expect: °· Any bruising will usually fade within 1 to 2 weeks.  °· Blood that collects in the tissue (hematoma) may be painful to the touch. It should usually decrease in size and tenderness within 1 to 2 weeks.  °SEEK IMMEDIATE MEDICAL CARE IF: °· You have unusual pain at the site or down the  affected limb.  °· You have redness, warmth, swelling, or pain at the site.  °· You have drainage (other than a small amount of blood on the dressing).  °· You have chills.  °· You have a fever or persistent symptoms for more than 72 hours.  °· You have a fever and your symptoms suddenly get worse.  °· Your leg becomes pale, cool, tingly, or numb.  °· You have heavy bleeding from the site. Hold pressure on the site.  °Document Released: 03/07/2010 Document Revised: 01/22/2011 Document Reviewed: 03/07/2010 °[employer]® Patient Information ©2012 [employer]. ° °

## 2013-08-11 NOTE — Discharge Summary (Signed)
CARDIOLOGY DISCHARGE SUMMARY   Patient ID: Max Byrd MRN: [id number] DOB/AGE: 06/13/1955 58 y.o.  Admit date: 08/10/2013 Discharge date: 08/11/2013  PCP: Wenda Low, MD Primary Cardiologist: Dr. Tamala Julian  Primary Discharge Diagnosis:   Other and unspecified angina pectoris - s/p 3.0 x 18 mm long Xience Alpine drug-eluting stent to the [place]  Secondary Discharge Diagnosis:   Procedures: 1. Prolonged complicated PCI distal obtuse marginal via the right femoral artery; 2. DES OM #1   Hospital Course: Max Byrd is a 58 y.o. male with a hx CAD, CABG w/ bioprosthetic AoVR, CKD, HTN, HLD, and renal CA, had cath w/ unsuccessful PCI 2 weeks ago. Pt scheduled for PCI and came in for the procedure on 06/25.   Procedure results are listed below. He had successful PCI to the OM1 with a drug-eluting stent. He tolerated the procedure well. He was hydrated and watched overnight.  He was noted to have mild thrombocytopenia but had no bleeding issues. His renal function, hemoglobin and hematocrit remained stable post-procedure.   On 06/26, he was seen by Dr. Tamala Julian and by cardiac rehabilitation. He was taken off his Imdur, and is to continue his other medications. He has a prescription for Viagra and was given information on the interaction of Viagra with nitrates. He has a history of GI upset with NSAIDs, and had Protonix added to his medication list. No further inpatient workup is indicated he is considered stable for discharge, to follow up as an outpatient.  Labs:  Lab Results  Component Value Date   WBC 5.3 08/11/2013   HGB 13.2 08/11/2013   HCT 37.1* 08/11/2013   MCV 90.0 08/11/2013   PLT 128* 08/11/2013     Recent Labs Lab 08/11/13 0350  NA 142  K 4.0  CL 103  CO2 27  BUN 23  CREATININE 1.06  CALCIUM 8.7  GLUCOSE 93   Cardiac Cath: 08/10/2013 PROCEDURE TECHNIQUE: After Xylocaine anesthesia a 6 [place] and eventually upgraded to a 7 [place] sheath was placed in the right femoral  artery with a single anterior needle wall stick. Coronary guiding shots were made using multiple catheters including: A special order Amplatz left 5 cm, CLS 5 cm, Voda 5 cm, and ultimately a 4.0 cm XB LAD. The Amplatz left 5 cm nicely cannulated the LAD but this catheter was 120 cm in length (see below).  Antithrombotic therapy, bivalirudin bolus and infusion, was begun and determined to be therapeutic by ACT. Antiplatelet therapy, Plavix 300 mg 4 hours prior to the procedure, was loaded.  Starting with the Amplatz left 5 cm catheter, a great deal of difficulty was encountered cannulating the left main. The Amplatz required that the guidewire be curled/ placed in the sinus of Valsalva and Amplatz then pushed forward, removing the guidewire, then allowing the Amplatz to fall into the left sinus of Valsalva. This catheter then needed to be actively advanced into a superior takeoff of the left main. With difficulty this was achieved. The catheter was then slightly retracted and we were eventually able to place a guidewire distal to the stenosis in the obtuse marginal. Unfortunately, the Amplatz catheter was 120 cm in length. I standard balloon and stent catheters are 100 cm in length. The balloon could not reach the stenosis in the midportion of the obtuse marginal.  Distended cause [place] to have to go back to standard length catheters that we had available in the laboratory. After going through a sequence of catheter as noted above  and XB LAD 4 cm catheter cannulated the left main and allowed [place] to position such that we could wire the circumflex. We then used a 3.0 x 15 mm long balloon to predilate. We were then able to positioned and deployed a 3.0 x 18 mm long Xience Alpine. The stent was deployed at 14 atmospheres. We then post dilated using an 8 mm long by 3.5 cm [place] balloon. Overlapping inflations from the distal margin to the proximal third of the stent were performed to 14 atmospheres. This was an attempt to  flare the stent to appropriately sized the region of poststenotic dilatation in the mid circumflex. TIMI grade 3 flow was noted. Intracoronary nitroglycerin was administered. Post procedural angiographic images revealed a very nice result. The case was terminated patient stable.  An 8 [place] Angio-Seal was used to achieve hemostasis without complications.  EQUIPMENT: Multiple guide catheters as mentioned above. Pro-water Guidewire guidewire, 190 and 300 cm in length.  CONTRAST: Total of 200 cc.  COMPLICATIONS: Prolonged procedure and do to distorted angulation of the ACE and aorta arising horizontal in the mediastinum with superior margin of the left main from the aorta. Case was extremely complicated related to the anatomic distortions of the presence of a bioprosthetic aortic valve that prevented typical catheter torque..  ANGIOGRAPHIC RESULTS: 99% mid obtuse marginal stenosis reduced to 0% with TIMI grade 3 flow  IMPRESSIONS: Successful, complicated, prolonged PCI of the first obtuse marginal with reduction and 99% stenosis to 0% with TIMI grade 3 flow   FOLLOW UP PLANS AND APPOINTMENTS Allergies  Allergen Reactions  . Aspirin Other (See Comments)    H/O BLEEDING ULCER  . Buspirone Other (See Comments)    MEMORY LOSS  . Klonopin [Clonazepam] Other (See Comments)    Erectile dysfunction  . Nsaids Other (See Comments)    Stomach bleeding  . Zoloft [Sertraline Hcl] Other (See Comments)    FELT TERRIBLE  . Reglan [Metoclopramide] Anxiety    EXCITABILITY      Medication List    STOP taking these medications       isosorbide mononitrate 30 MG 24 hr tablet  Commonly known as:  IMDUR      TAKE these medications       ALPRAZolam 1 MG tablet  Commonly known as:  XANAX  Take 1 mg by mouth 4 (four) times daily.     amLODipine 5 MG tablet  Commonly known as:  NORVASC  Take 1 tablet (5 mg total) by mouth daily.     ARTHROTEC 50-0.2 MG Tbec  Generic drug:  Diclofenac-Misoprostol    Take 1 tablet by mouth 2 (two) times daily.     aspirin 81 MG EC tablet  Take 1 tablet (81 mg total) by mouth daily.     carisoprodol 350 MG tablet  Commonly known as:  SOMA  Take 350 mg by mouth 2 (two) times daily.     CLEAR EYES OP  Place 1 drop into both eyes daily as needed (dry eyes).     clopidogrel 75 MG tablet  Commonly known as:  PLAVIX  Take 1 tablet (75 mg total) by mouth daily with breakfast.     DULoxetine 30 MG capsule  Commonly known as:  CYMBALTA  Take 30 mg by mouth 2 (two) times daily.     escitalopram 10 MG tablet  Commonly known as:  LEXAPRO  Take 10 mg by mouth 2 (two) times daily.     eszopiclone 3 MG Tabs  Generic drug:  Eszopiclone  Take 3 mg by mouth at bedtime. Take immediately before bedtime     nitroGLYCERIN 0.4 MG SL tablet  Commonly known as:  NITROSTAT  Place 1 tablet (0.4 mg total) under the tongue every 5 (five) minutes as needed for chest pain.     OXYCONTIN 30 MG T12a  Generic drug:  OxyCODONE HCl ER  Take 30 mg by mouth every 12 (twelve) hours.     pantoprazole 40 MG tablet  Commonly known as:  PROTONIX  Take 1 tablet (40 mg total) by mouth daily.     Tapentadol HCl 75 MG Tabs  Commonly known as:  NUCYNTA  Take 1 tablet (75 mg total) by mouth 4 (four) times daily.        Discharge Instructions   Diet - low sodium heart healthy    Complete by:  As directed      Increase activity slowly    Complete by:  As directed           Follow-up Information   Follow up with Ermalinda Barrios, PA-C On 08/21/2013. (For Dr. Tamala Julian, At 2:15 PM)    Specialty:  Cardiology   Contact information:   [address] [place] [address] [phone]       BRING ALL MEDICATIONS WITH YOU TO FOLLOW UP APPOINTMENTS  Time spent with patient to include physician time: 39 min Signed: Rosaria Ferries, PA-C 08/11/2013, 9:13 AM Co-Sign MD

## 2013-08-11 NOTE — Progress Notes (Signed)
     Patient Name: Max Byrd Date of Encounter: 08/11/2013  Active Problems:   Other and unspecified angina pectoris    Patient Profile: 58 yo male w/ hx CAD, CABG w/ bioprosthetic AoVR, CKD, HTN, HLD, renal CA had cath w/ unsuccessful PCI 2 weeks ago. Pt scheduled for PCI and came in for procedure on 06/25.    SUBJECTIVE: No chest pain, no SOB, agreeable to cardiac rehab  OBJECTIVE Filed Vitals:   08/10/13 1509 08/10/13 2006 08/11/13 0009 08/11/13 0430  BP: 120/88 152/91 141/79 137/75  Pulse: 73 73 73 65  Temp: 97.8 F (36.6 C) 97.9 F (36.6 C) 97.8 F (36.6 C) 97.3 F (36.3 C)  TempSrc: Oral Oral Oral Oral  Resp: 18 18 18 18   Weight:   167 lb 5.3 oz (75.9 kg)   SpO2: 98% 93% 95% 96%    Intake/Output Summary (Last 24 hours) at 08/11/13 0622 Last data filed at 08/10/13 1900  Gross per 24 hour  Intake   1230 ml  Output   1100 ml  Net    130 ml   Filed Weights   08/11/13 0009  Weight: 167 lb 5.3 oz (75.9 kg)    PHYSICAL EXAM General: Well developed, well nourished, male in no acute distress. Head: Normocephalic, atraumatic.  Neck: Supple without bruits, JVD not elevated. Lungs:  Resp regular and unlabored, CTA. Heart: RRR, S1, S2, no S3, S4, 2/6 murmur; no rub. Abdomen: Soft, non-tender, non-distended, BS + x 4.  Extremities: No clubbing, cyanosis, no edema. Cath site without ecchymosis, hematoma or bruit. Neuro: Alert and oriented X 3. Moves all extremities spontaneously. Psych: Normal affect.  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 rate 65 BPM PR interval 180 ms QRS duration 136 ms QT/QTc 444/461 ms P-R-T axes 1 -66  66  Current Medications:  . ALPRAZolam  1 mg Oral QID  . amLODipine  5 mg Oral Daily  . aspirin EC  81 mg Oral Daily  . carisoprodol  350 mg Oral BID  . clopidogrel  75 mg Oral Q breakfast  . DULoxetine  30 mg Oral BID  . escitalopram  10 mg Oral BID  . OxyCODONE  30 mg Oral 2 times per day      ASSESSMENT AND PLAN: Active Problems:   Other and unspecified angina pectoris - s/p complicated, prolonged PCI of the first obtuse marginal with 3.0 x 18 mm long Xience Alpine stent, reduction of 99% stenosis to 0% with TIMI grade 3 flow    Thrombocytopenia - has had in past, lowest platelets seen were 109 in January. No bleeding problems, continue to follow.    E.D. - pt is on Viagra, was given information regarding interaction of SL NTG and this medication. MD advise if OK for him to use Viagra.   Plan - d/c today.  Jonetta Speak , PA-C 6:22 AM 08/11/2013

## 2013-08-11 NOTE — H&P (Signed)
The only significant changes in the history and physical since my 07/26/13 encounter is that the patient underwent diagnostic coronary angiography by Dr. Ellyn Hack on 07/28/2013. Bypass graft occlusion was noted and there was a failed attempt at angioplasty of the second obtuse marginal that was supplied by the bypass graft that failed. The patient is back for a repeat attempt atobtuse marginal PTCA and stenting. Special cath was ordered to more appropriately to figure with the patient's aortic root.  The physical exam has not changed.  Symptoms have somewhat improved on medical therapy.

## 2013-08-11 NOTE — Progress Notes (Signed)
CARDIAC REHAB PHASE I   PRE:  Rate/Rhythm: 71 SR    BP: sitting 150/91    SaO2:   MODE:  Ambulation: 1000 ft   POST:  Rate/Rhythm: 88 SR    BP: sitting 174/125, 178/110, 10 min later 174/109     SaO2:   Tolerated well, feels good. No BP meds >24 hrs, BP very elevated after walk. Reviewed ed with pt and wife. Pt is not interested in eating vegetables or quitting dipping. Seems very determined to do what he wants to do. 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 Gave pt walking guidelines for next week before going back to gym. [id number]  Max Byrd CES, ACSM 08/11/2013 8:36 AM

## 2013-08-21 ENCOUNTER — Ambulatory Visit (INDEPENDENT_AMBULATORY_CARE_PROVIDER_SITE_OTHER): Payer: Federal, State, Local not specified - PPO | Admitting: Physician Assistant

## 2013-08-21 ENCOUNTER — Encounter: Payer: Self-pay | Admitting: Physician Assistant

## 2013-08-21 VITALS — BP 144/96 | HR 69 | Ht 66.0 in | Wt 172.0 lb

## 2013-08-21 DIAGNOSIS — I10 Essential (primary) hypertension: Secondary | ICD-10-CM

## 2013-08-21 MED ORDER — IRBESARTAN 150 MG PO TABS: 150.0000 mg | ORAL_TABLET | Freq: Every day | ORAL | Status: DC

## 2013-08-21 NOTE — Patient Instructions (Signed)
Your physician recommends that you schedule a follow-up appointment in: [place] IN 2 MONTHS  Your physician has recommended you make the following change in your medication:   START AVAPRO 150 MG ONCE A Day  STOP [place].

## 2013-08-21 NOTE — Assessment & Plan Note (Signed)
Blood pressure is still elevated and he has swelling on amlodipine. We'll stop amlodipine and resume Avapro 150 mg daily. He is to call with blood pressure checks in the next 2 weeks. 2 g sodium diet.

## 2013-08-21 NOTE — Progress Notes (Signed)
HPI: Mr. Signorelli is a 58 year old male patient Dr. Tamala Julian who has history of coronary artery disease and bicuspid aortic valve with aortic stenosis status post CABG and bioprosthetic aVR, chronic kidney disease, renal cancer, hypertension and hyperlipidemia. He recently had class III angina and underwent cardiac cath that demonstrated patent LIMA to the LAD, SVG to the diagonal 1 and occluded SVG to the OM. The culprit was felt to be the 95% lesion in the OM branch because of the occluded SVG. Multiple attempts were made to engage the circumflex artery by Dr. Ellyn Hack that were unsuccessful and PCI was aborted. The creatinine remained stable. He was discharged home and readmitted for another attempt at PCI. This was performed by Dr. Daneen Schick and was a successful complicated prolonged PCI of the first OM with reduction of 99% stenosis to 0%.  Since the patient's been home he denies any chest pain, palpitations, dyspnea, dyspnea on exertion, dizziness or presyncope. He complains of ankle swelling since he's was started on amlodipine. He used to take Avapro for his blood pressure. Nitrates were stopped once the PCI was successful. Beta blockers were not used in the past because of bradycardia. He he would like to to try Avapro again. He also wants to begin weight lifting.  Allergies:  -- Aspirin -- Other (See Comments)   --  H/O BLEEDING ULCER  -- Buspirone -- Other (See Comments)   --  MEMORY LOSS  -- Klonopin [Clonazepam] -- Other (See Comments)   --  Erectile dysfunction  -- Nsaids -- Other (See Comments)   --  Stomach bleeding  -- Zoloft [Sertraline Hcl] -- Other (See Comments)   --  FELT TERRIBLE  -- Reglan [Metoclopramide] -- Anxiety   --  EXCITABILITY  Current Outpatient Prescriptions on File Prior to Visit: ALPRAZolam (XANAX) 1 MG tablet, Take 1 mg by mouth 4 (four) times daily., Disp: , Rfl:  amLODipine (NORVASC) 5 MG tablet, Take 1 tablet (5 mg total) by mouth daily., Disp: 30 tablet, Rfl:  11 aspirin EC 81 MG EC tablet, Take 1 tablet (81 mg total) by mouth daily., Disp: , Rfl:  carisoprodol (SOMA) 350 MG tablet, Take 350 mg by mouth 2 (two) times daily., Disp: , Rfl:  clopidogrel (PLAVIX) 75 MG tablet, Take 1 tablet (75 mg total) by mouth daily with breakfast., Disp: 30 tablet, Rfl: 11 Diclofenac-Misoprostol (ARTHROTEC) 50-0.2 MG TBEC, Take 1 tablet by mouth 2 (two) times daily., Disp: , Rfl:  DULoxetine (CYMBALTA) 30 MG capsule, Take 30 mg by mouth 2 (two) times daily., Disp: , Rfl:  escitalopram (LEXAPRO) 10 MG tablet, Take 10 mg by mouth 2 (two) times daily., Disp: , Rfl:  Eszopiclone (ESZOPICLONE) 3 MG TABS, Take 3 mg by mouth at bedtime. Take immediately before bedtime, Disp: , Rfl:  Naphazoline HCl (CLEAR EYES OP), Place 1 drop into both eyes daily as needed (dry eyes)., Disp: , Rfl:  nitroGLYCERIN (NITROSTAT) 0.4 MG SL tablet, Place 1 tablet (0.4 mg total) under the tongue every 5 (five) minutes as needed for chest pain., Disp: 25 tablet, Rfl: 2 OXYCONTIN 30 MG T12A, Take 30 mg by mouth every 12 (twelve) hours. , Disp: , Rfl:  pantoprazole (PROTONIX) 40 MG tablet, Take 1 tablet (40 mg total) by mouth daily., Disp: 30 tablet, Rfl: 11 Tapentadol HCl (NUCYNTA) 75 MG TABS, Take 1 tablet (75 mg total) by mouth 4 (four) times daily., Disp: 30 tablet, Rfl: 0  No current facility-administered medications on file prior to visit.   Past  Medical History:   Erectile dysfunction                                           Comment:INJECTIONS OF PROSTAGLANDIN BY UROLGIST   CKD (chronic kidney disease) stage 2, GFR 60-8*              Renal cell carcinoma of right kidney            2003         H/O Bell's palsy                                2010 X2      Depression                                                   Anxiety                                                      Pain, chronic postoperative                                    Comment:S/P LEFT ARM SURGERY   IBS (irritable bowel  syndrome)                               Bicuspid aortic valve                                          Comment:a. 01/2013 s/p AVR - 86mm Edwards 3300 TFX               pericardial tissue valve, ser # [id number].   Essential hypertension, benign                               Hyperlipidemia                                               Coronary atherosclerosis of native coronary ar*                Comment:a. 01/2013 CABGx3: LIMA->LAD, VG->Diag, VG->OM               (performed @ time of AVR),  occluded SVG-OM2 ,               now with 95% stenosis at the anastomosis site               on OM 2 - PCI attempt 07/28/13 unsuccessful -               med Rx cont'd    Heart murmur  Anginal pain                                    2015           Comment:"since valve was done"   History of blood transfusion                                   Comment:"related to stomach bleeding from ASA & NSAIDS   History of stomach ulcers                                    Migraine                                                       Comment:"went away in the 1990's"   Arthritis                                                      Comment:"left pointer finger; forminal openings               bilaterally" (08/10/2013)  Past Surgical History:   COLONOSCOPY                                      2008           Comment:DIVERTICULOSIS   ANAL SPHINCTEROTOMY                              04/13/03        Comment:DR. GRAPEY   PARTIAL NEPHRECTOMY                              08/24/02         Comment:RIGHT...DR. Risa Grill   LATERAL EPICONDYLE RELEASE                      Left 07/29/07        Comment:DR. GRAVES   TONSILLECTOMY                                                 AORTIC VALVE REPLACEMENT                        N/A 02/14/2013     Comment:Procedure: AORTIC VALVE REPLACEMENT (AVR);                Surgeon: Ivin Poot, MD;  Location: [place];              Service: Open Heart  Surgery;  Laterality: N/A;   CORONARY ARTERY BYPASS GRAFT  N/A 02/14/2013     Comment:Procedure: CORONARY ARTERY BYPASS GRAFTING               (CABG);  Surgeon: Ivin Poot, MD;                Location: [facility] [place];  Service: Open Heart Surgery;               Laterality: N/A;   INTRAOPERATIVE TRANSESOPHAGEAL ECHOCARDIOGRAM   N/A 02/14/2013     Comment:Procedure: INTRAOPERATIVE TRANSESOPHAGEAL               ECHOCARDIOGRAM;  Surgeon: Ivin Poot, MD;               Location: [facility];  Service: Open Heart Surgery;               Laterality: N/A;   CARDIAC CATHETERIZATION                          2010; 6/1*   CORONARY ANGIOPLASTY WITH STENT PLACEMENT        08/10/2013      Comment:"1"   EXCISIONAL HEMORRHOIDECTOMY                                   HERNIA REPAIR                                   Left 1957         ELBOW SURGERY                                   Left X 2          ELBOW SURGERY                                   Right X 1          TOE SURGERY                                     Right                Comment:"shaved; wired back straight"   CARDIAC VALVE REPLACEMENT                                    Review of patient's family history indicates:   Cancer                         Mother                     Comment: BREAST/OVARIAN   Heart disease                  Father                     Comment: S/P MI/CABG   Social History   Marital Status: Married             Spouse Name:  Years of Education:                 Number of children:             Occupational History   None on file  Social History Main Topics   Smoking Status: Former Smoker                   Packs/Day: 1.50  Years: 5         Types: Cigarettes     Quit date: 01/21/1976   Smokeless Status: Current User                       Types: Snuff   Alcohol Use: Yes               Comment: "last beer was 01/2013"   Drug Use: No             Sexual Activity: Yes                Other  Topics            Concern   None on file  Social History Narrative   Was working out regularly prior to CABG/AVR.  Hunts/fishes.    ROS: See history of present illness otherwise negative   PHYSICAL EXAM: Well-nournished, in no acute distress. Neck: No JVD, HJR, Bruit, or thyroid enlargement  Lungs: No tachypnea, clear without wheezing, rales, or rhonchi  Cardiovascular: RRR, PMI not displaced, heart sounds normal, no murmurs, gallops, bruit, thrill, or heave.  Abdomen: BS normal. Soft without organomegaly, masses, lesions or tenderness.  Extremities: Right carotid without hematoma or hemorrhage otherwise lower extremities without cyanosis, clubbing or edema. Good distal pulses bilateral  SKin: Warm, no lesions or rashes   Musculoskeletal: No deformities  Neuro: no focal signs  BP 144/96  Pulse 69  Ht 5\' 6"  (1.676 m)  Wt 172 lb (78.019 kg)  BMI 27.77 kg/m2   EKG: Normal sinus rhythm nonspecific intraventricular block  Procedures Performed This Admission:    Cardiac Catheterization and Attempted PCI (07/28/13): Aortic/Subclavian Anatomy:   The aortic root is extremely dilated and configured such that the heart has almost horizontal well seated prosthetic aortic valve with extremely dilated sinuses of Valsalva making engagement of coronary ostia very difficult.    The Left Subclavian Artery is extremely tortuous with essentially a S turn prior to giving off the vertebral artery and continuing down into the axilla. The IMA takeoff is posterior just prior to the final bend. Coronary Anatomy: Left Dominant   Left Main: Large-caliber, short vessel that bifurcates into the LAD and Circumflex. The takeoff of the left main has almost vertical with the aorta being horizontal. The takeoff of the circumflex is almost 110 from the LAD. The left main is calcified but angiographically normal. LAD: Moderate caliber vessel that gives off diffusely diseased diagonal branch for being  included in the mid vessel.   D1: Smaller moderate caliber vessel with diffuse disease and evidence of repetitive flow distally to Left Circumflex: Large-caliber, dominant vessel that gives off 2 very small proximal marginal branches. It then gives off a large caliber OM1 before coursing into the AV groove where it gives off a left posterolateral artery and the posterior descending artery. There is diffuse mild to moderate disease in the AV groove circumflex distally to the PDA.   OM1: Large-caliber vessel with a almost vertical takeoff from the circumflex that gives off a  small branch proximally. It then courses down along the inferolateral wall with the long extensor 60-70% stenosis. The vessel then normalizes until the likely graft anastomosis site where there is a focal 95-99% stenosis. Following this there is post stenotic dilation and the relatively normal vessel distally. The vessel courses at end of apex.    ZOX:WRUEA caliber, nondominant vessel is diffusely diseased. Very difficult to engage . Graft Anatomy:   LIMA-LAD: Difficult takeoff, but widely patent graft that reaches the mid LAD. Antegrade flow is relatively normal LAD distally. Minimal retrograde flow noted.    SVG-D1: Small to moderate caliber graft to a small caliber diagonal vessel. There is mild ostial disease in the graft and otherwise relatively normal. Minimal retrograde flow up the diagonal. Antegrade flow shows multiple branches of the diagonal but no significant stenosis.    SVG-OM: 100% aorto-stial occlusion  After reviewing the initial angiography, the culprit lesion was thought to be the 95% lesion in the OM branch D2 occlusion of the vein graft. Also considered important with diffuse 60-70% lesions more proximally. Preparation were made to proceed with PCI on this lesion.  After multiple attempts were made to engage the circumflex artery, the decision made to abort PCI. Plan is to optimize medical therapy by adding  nitrates and amlodipine. We are not using beta blocker due to his bradycardia. CONTRAST:  Total of 200  cc.  COMPLICATIONS:  Prolonged procedure and do to distorted angulation of the ACE and aorta arising horizontal in the mediastinum with superior margin of the left main from the aorta. Case was extremely complicated related to the anatomic distortions of the presence of a bioprosthetic aortic valve that prevented typical catheter torque..    ANGIOGRAPHIC RESULTS:   99% mid obtuse marginal stenosis reduced to 0% with TIMI grade 3 flow   IMPRESSIONS:  Successful, complicated, prolonged PCI of the first obtuse marginal with reduction and 99% stenosis to 0% with TIMI grade 3 flow   RECOMMENDATION:  Plavix and aspirin for one year Discharge in a.m..    Sinclair Grooms, MD 08/10/2013 11:39 AM

## 2013-08-21 NOTE — Assessment & Plan Note (Signed)
Patient is feeling well after successful drug-eluting stent to the OM1 by Dr. Tamala Julian in 08/10/13. Followup with Dr. Tamala Julian in 2 months. No heavy weight lifting for another month.

## 2013-08-25 ENCOUNTER — Other Ambulatory Visit: Payer: Self-pay | Admitting: Cardiothoracic Surgery

## 2013-08-25 DIAGNOSIS — I359 Nonrheumatic aortic valve disorder, unspecified: Secondary | ICD-10-CM

## 2013-08-30 ENCOUNTER — Ambulatory Visit (INDEPENDENT_AMBULATORY_CARE_PROVIDER_SITE_OTHER): Payer: Federal, State, Local not specified - PPO | Admitting: Cardiothoracic Surgery

## 2013-08-30 ENCOUNTER — Encounter: Payer: Self-pay | Admitting: Cardiothoracic Surgery

## 2013-08-30 ENCOUNTER — Ambulatory Visit
Admission: RE | Admit: 2013-08-30 | Discharge: 2013-08-30 | Disposition: A | Discharge status: Home / Self Care | Payer: Federal, State, Local not specified - PPO | Source: Ambulatory Visit | Attending: Cardiothoracic Surgery | Admitting: Cardiothoracic Surgery

## 2013-08-30 VITALS — BP 120/70 | HR 60 | Resp 16 | Ht 66.0 in | Wt 172.0 lb

## 2013-08-30 DIAGNOSIS — Q231 Congenital insufficiency of aortic valve: Secondary | ICD-10-CM

## 2013-08-30 DIAGNOSIS — Q2381 Bicuspid aortic valve: Secondary | ICD-10-CM

## 2013-08-30 DIAGNOSIS — I2510 Atherosclerotic heart disease of native coronary artery without angina pectoris: Secondary | ICD-10-CM

## 2013-08-30 DIAGNOSIS — G894 Chronic pain syndrome: Secondary | ICD-10-CM

## 2013-08-30 DIAGNOSIS — Z951 Presence of aortocoronary bypass graft: Secondary | ICD-10-CM

## 2013-08-30 DIAGNOSIS — I359 Nonrheumatic aortic valve disorder, unspecified: Secondary | ICD-10-CM

## 2013-08-30 DIAGNOSIS — Z952 Presence of prosthetic heart valve: Secondary | ICD-10-CM

## 2013-08-30 DIAGNOSIS — I350 Nonrheumatic aortic (valve) stenosis: Secondary | ICD-10-CM

## 2013-08-30 DIAGNOSIS — Z954 Presence of other heart-valve replacement: Secondary | ICD-10-CM

## 2013-08-30 NOTE — Progress Notes (Signed)
PCP is Wenda Low, MD Referring Provider is Sinclair Grooms, MD  Chief Complaint  Patient presents with  . Follow-up    3 month f/u with CXR    HPI: 6 month followup after aVR CABG x3. Patient was recently hospitalized for repeat cardiac catheterization and stent of the OM after the vein graft to the OM l branch of the left coronary occluded, probably related to competitive flow to a large native vessel. He has numerous complaints about his neck arthritis, arm pain, leg swelling, weight gain, and stress. He states he is going to stop his job working for the [employer]. I reviewed the recent coronary tear grams in his mammary artery graft to the LAD, vein graft to the diagonal, and drug-eluting stent to the circumflex and the bioprosthetic aortic valve all appeared to be in good condition.   Past Medical History  Diagnosis Date  . Erectile dysfunction     INJECTIONS OF PROSTAGLANDIN BY UROLGIST  . CKD (chronic kidney disease) stage 2, GFR 60-89 ml/min   . Renal cell carcinoma of right kidney 2003  . H/O Bell's palsy 2010 X2  . Depression   . Anxiety   . Pain, chronic postoperative     S/P LEFT ARM SURGERY  . IBS (irritable bowel syndrome)   . Bicuspid aortic valve     a. 01/2013 s/p AVR - 89mm Edwards 3300 TFX pericardial tissue valve, ser # [id number].  Marland Kitchen Essential hypertension, benign   . Hyperlipidemia   . Coronary atherosclerosis of native coronary artery     a. 01/2013 CABGx3: LIMA->LAD, VG->Diag, VG->OM (performed @ time of AVR),  occluded SVG-OM2 , now with 95% stenosis at the anastomosis site on OM 2 - PCI attempt 07/28/13 unsuccessful - med Rx cont'd   . Heart murmur   . Anginal pain 2015    "since valve was done"  . History of blood transfusion     "related to stomach bleeding from ASA & NSAIDS  . History of stomach ulcers   . Migraine     "went away in the 1990's"  . Arthritis     "left pointer finger; forminal openings bilaterally" (08/10/2013)    Past Surgical  History  Procedure Laterality Date  . Colonoscopy  2008    DIVERTICULOSIS  . Anal sphincterotomy  04/13/03    DR. GRAPEY  . Partial nephrectomy  08/24/02    RIGHT...DR. Risa Grill  . Lateral epicondyle release Left 07/29/07    DR. GRAVES  . Tonsillectomy    . Aortic valve replacement N/A 02/14/2013    Procedure: AORTIC VALVE REPLACEMENT (AVR);  Surgeon: Ivin Poot, MD;  Location: [facility];  Service: Open Heart Surgery;  Laterality: N/A;  . Coronary artery bypass graft N/A 02/14/2013    Procedure: CORONARY ARTERY BYPASS GRAFTING (CABG);  Surgeon: Ivin Poot, MD;  Location: [facility];  Service: Open Heart Surgery;  Laterality: N/A;  . Intraoperative transesophageal echocardiogram N/A 02/14/2013    Procedure: INTRAOPERATIVE TRANSESOPHAGEAL ECHOCARDIOGRAM;  Surgeon: Ivin Poot, MD;  Location: [facility];  Service: Open Heart Surgery;  Laterality: N/A;  . Cardiac catheterization  2010; 6/12015; 07/29/2013  . Coronary angioplasty with stent placement  08/10/2013    "1"  . Excisional hemorrhoidectomy    . Hernia repair Left 1957  . Elbow surgery Left X 2  . Elbow surgery Right X 1  . Toe surgery Right     "shaved; wired back straight"  . Cardiac valve replacement  Family History  Problem Relation Age of Onset  . Cancer Mother     BREAST/OVARIAN  . Heart disease Father     S/P MI/CABG    Social History History  Substance Use Topics  . Smoking status: Former Smoker -- 1.50 packs/day for 5 years    Types: Cigarettes    Quit date: 01/21/1976  . Smokeless tobacco: Current User    Types: Snuff  . Alcohol Use: Yes     Comment: "last beer was 01/2013"    Current Outpatient Prescriptions  Medication Sig Dispense Refill  . ALPRAZolam (XANAX) 1 MG tablet Take 1 mg by mouth 4 (four) times daily.      Marland Kitchen aspirin EC 81 MG EC tablet Take 1 tablet (81 mg total) by mouth daily.      . carisoprodol (SOMA) 350 MG tablet Take 350 mg by mouth 2 (two) times daily.      . clopidogrel (PLAVIX) 75  MG tablet Take 1 tablet (75 mg total) by mouth daily with breakfast.  30 tablet  11  . Diclofenac-Misoprostol (ARTHROTEC) 50-0.2 MG TBEC Take 1 tablet by mouth 2 (two) times daily.      . DULoxetine (CYMBALTA) 30 MG capsule Take 30 mg by mouth 2 (two) times daily.      Marland Kitchen escitalopram (LEXAPRO) 10 MG tablet Take 10 mg by mouth 2 (two) times daily.      . Eszopiclone (ESZOPICLONE) 3 MG TABS Take 3 mg by mouth at bedtime. Take immediately before bedtime      . irbesartan (AVAPRO) 150 MG tablet Take 1 tablet (150 mg total) by mouth daily.  30 tablet  4  . Naphazoline HCl (CLEAR EYES OP) Place 1 drop into both eyes daily as needed (dry eyes).      . nitroGLYCERIN (NITROSTAT) 0.4 MG SL tablet Place 1 tablet (0.4 mg total) under the tongue every 5 (five) minutes as needed for chest pain.  25 tablet  2  . OXYCONTIN 30 MG T12A Take 30 mg by mouth every 12 (twelve) hours.       . pantoprazole (PROTONIX) 40 MG tablet Take 1 tablet (40 mg total) by mouth daily.  30 tablet  11  . Tapentadol HCl (NUCYNTA) 75 MG TABS Take 1 tablet (75 mg total) by mouth 4 (four) times daily.  30 tablet  0   No current facility-administered medications for this visit.    Allergies  Allergen Reactions  . Aspirin Other (See Comments)    H/O BLEEDING ULCER  . Buspirone Other (See Comments)    MEMORY LOSS  . Klonopin [Clonazepam] Other (See Comments)    Erectile dysfunction  . Nsaids Other (See Comments)    Stomach bleeding  . Zoloft [Sertraline Hcl] Other (See Comments)    FELT TERRIBLE  . Reglan [Metoclopramide] Anxiety    EXCITABILITY     Review of Systems patient is having: His chronic pain clinic and wishes referral to [facility] long pain.clinic, Dr. Joylene Igo  Patient complains of ankle edema associated with Norvasc which has recently been changed back to Avapro BP 120/70  Pulse 60  Resp 16  Ht 5\' 6"  (1.676 m)  Wt 172 lb (78.019 kg)  BMI 27.77 kg/m2  SpO2 96% Physical Exam Alert and oriented Lungs  clear Heart rhythm regular Soft flow murmur through aortic valve, no AI murmur Moderate ankle edema bilaterally  Diagnostic Tests: Coronary tear grams performed last month reviewed  Impression: Stable cardiac condition status post aVR CABG and recent  PCI Patient referred [facility]  Plan: Continue medications Importance of weight control, , lipid control, Blood pressure control, and dental hygiene emphasized to the patient in detail to maintain optimal cardiac health. He understands the importance of long-term Plavix therapy as well.  Return as needed

## 2013-09-01 ENCOUNTER — Telehealth: Payer: Self-pay | Admitting: Interventional Cardiology

## 2013-09-01 ENCOUNTER — Encounter: Payer: Self-pay | Admitting: *Deleted

## 2013-09-01 NOTE — Telephone Encounter (Addendum)
At the pts last OV with Estella Husk, PA-C on 08/21/13 the pt was advised to stop taking Amlodipine, due to edema, and to resume Avapro 150 mg daily and to call with blood pressure readings in the next 2 weeks.  The pt states that he does not have a BP cuff at home and that it is difficult for him to check his BP daily. He did have his BP checked at Dr Lucianne Lei Tright's office on 7/15 and he states that his BP was 121/70 at that time. He states that he has no complaints with the medication change and denies headaches.  The pt is aware that I am sending this message to Dr Tamala Julian as an Juluis Rainier.

## 2013-09-01 NOTE — Progress Notes (Signed)
Patient ID: Max Byrd, male   DOB: [birth year], 58 y.o.   MRN: [id number] On 08/31/13 a referral was faxed to [facility] to eval and treat  his chronic pain issues per the request of Dr. Tharon Aquas Trigt.

## 2013-09-01 NOTE — Telephone Encounter (Signed)
New message     FYI Bp reading on 08-30-13 at Dr Nils Pyle office was 121/70.  Sharyn Lull wanted him to call and give this info

## 2013-09-11 ENCOUNTER — Encounter: Payer: Self-pay | Admitting: *Deleted

## 2013-09-11 NOTE — Progress Notes (Signed)
Patient ID: Max Byrd, male   DOB: 1955-08-04, 58 y.o.   MRN: [id number] I talked with Dr. Glenna Durand office today and he has agreed to refill [name] pain medication every two weeks until his care is transferred to the pain clinic.  I called their office. His paperwork is being reviewed but no appointment time as of today.

## 2013-09-14 ENCOUNTER — Telehealth: Payer: Self-pay | Admitting: *Deleted

## 2013-09-14 NOTE — Telephone Encounter (Signed)
Message forwarded to [facility]. Refill and Triage Pool  Rx's need to go to [facility] in [place], [place]

## 2013-09-14 NOTE — Telephone Encounter (Signed)
Received return call from patient that he would like to get Rx for amlodipine. He reports that he is still have edema NOT taking amlodipine and is taking lasix now. He reports amlodipine helped his heart "not hurt"  Will defer this message to M. Lenze, PA to advise, as this had previously been changed to avapro 150mg .

## 2013-09-14 NOTE — Telephone Encounter (Signed)
Called patient to clarify if he is taking amlodipine (which it appears may have been stopped on 7/6 r/t edema) and he was started on Avapro 150mg .   LM that patient call back

## 2013-09-15 NOTE — Telephone Encounter (Signed)
Left message for pt to call back to discuss Dr Thompson Caul recommendations

## 2013-09-15 NOTE — Telephone Encounter (Signed)
Will forward to Dr Tamala Julian since Sharyn Lull is not available.  Pt wanting to go back on amlodipine.  He saw Estella Husk, PA p/h and she changed him from Amlodipine due to edema to Avapro.  Is it OK to send RX for Amlodipine.

## 2013-09-15 NOTE — Telephone Encounter (Signed)
It would be okay to resume amlodipine as long as the patient understands that peripheral edema may recur. If amlodipine is resumed, Avapro should be discontinued.

## 2013-09-18 ENCOUNTER — Telehealth: Payer: Self-pay | Admitting: *Deleted

## 2013-09-18 ENCOUNTER — Other Ambulatory Visit: Payer: Self-pay | Admitting: Interventional Cardiology

## 2013-09-18 MED ORDER — AMLODIPINE BESYLATE 5 MG PO TABS: 5.0000 mg | ORAL_TABLET | Freq: Every day | ORAL | Status: DC

## 2013-09-18 NOTE — Telephone Encounter (Signed)
Pt returning phone call from 7/30 in regards to his amlodipine and Avapro.  Informed pt that per Dr Tamala Julian he can resume back to taking his Amlodipine 5 mg daily, but he must be aware that this medication may cause peripheral edema. Informed pt also that he must d/c the Avapro while he is taking Amlodipine.  Informed pt that I will update his med record to d/c Avapro and start taking Amlodipine 5 mg daily.  Pharmacy confirmed.  Pt verbalized understanding and agrees with this plan.

## 2013-09-19 ENCOUNTER — Other Ambulatory Visit: Payer: Self-pay

## 2013-09-19 NOTE — Telephone Encounter (Signed)
Is this patient still to be on this? Please advise. Thanks, MI

## 2013-09-19 NOTE — Telephone Encounter (Signed)
Please see the 8/3 telephone note

## 2013-09-25 ENCOUNTER — Telehealth: Payer: Self-pay | Admitting: *Deleted

## 2013-09-25 NOTE — Telephone Encounter (Signed)
Patient is requesting a lasix refill and wanted to know why it was denied. I made him aware that according to our records the med was stopped. He states that since starting back on the amlodipine he has had more edema in his ankles and would like to continue the lasix. He is out of the med. Please advise. Thanks, MI

## 2013-09-25 NOTE — Telephone Encounter (Signed)
returned pt call.lmtcb per Dr.Smith pt should not be continued of lasix

## 2013-10-02 ENCOUNTER — Encounter: Payer: Self-pay | Admitting: *Deleted

## 2013-10-02 NOTE — Progress Notes (Signed)
Patient ID: Max Byrd, male   DOB: 22-Dec-1955, 58 y.o.   MRN: [id number] Recently a referral was made to [facility] per Dr. Lucianne Lei Trigt's request.  Today a response was received denying him an evaluation.  I relayed this to him.  I suggested he contact his PCP for other referrals.  He agreed.

## 2013-10-11 ENCOUNTER — Telehealth: Payer: Self-pay | Admitting: Interventional Cardiology

## 2013-10-11 NOTE — Telephone Encounter (Signed)
returned pt call. lmtcb 

## 2013-10-11 NOTE — Telephone Encounter (Signed)
New message     Pt had bypass surgery in December 2014.  He is c/o severe edema, sob on exertion, frequent urination at night and zero energy and occasional cp on exertion relieved by nitro.  He thinks he is going into heart failure.  Please advise

## 2013-10-12 ENCOUNTER — Other Ambulatory Visit: Payer: Self-pay

## 2013-10-12 MED ORDER — NITROGLYCERIN 0.4 MG SL SUBL: 0.4000 mg | SUBLINGUAL_TABLET | SUBLINGUAL | Status: DC | PRN

## 2013-10-12 MED ORDER — FUROSEMIDE 40 MG PO TABS: 40.0000 mg | ORAL_TABLET | Freq: Every day | ORAL | Status: DC

## 2013-10-12 NOTE — Telephone Encounter (Signed)
Pt sts that his baseline wight is usually between 170-172lb. Pt does weigh regularly. Pt sts that he is currently up in weight to 175lb.pt sts that he does have LE edema and sob.pt give Dr.Smith instructions to start furosemide 40mg  daily.Rx sent to pt pharmacy.pt adv to keep upcoming appt with Dr.Smith on 9/9. pt adv to call the office next wk if swelling and sob has not improved. Pt agreeable with plan and verbalized understanding.

## 2013-10-12 NOTE — Telephone Encounter (Signed)
2nd attempt to reach pt. lmtcb 

## 2013-10-25 ENCOUNTER — Encounter: Payer: Self-pay | Admitting: Interventional Cardiology

## 2013-10-25 ENCOUNTER — Ambulatory Visit (INDEPENDENT_AMBULATORY_CARE_PROVIDER_SITE_OTHER): Payer: Federal, State, Local not specified - PPO | Admitting: Interventional Cardiology

## 2013-10-25 VITALS — BP 180/98 | HR 80 | Ht 66.0 in | Wt 173.0 lb

## 2013-10-25 DIAGNOSIS — I10 Essential (primary) hypertension: Secondary | ICD-10-CM

## 2013-10-25 DIAGNOSIS — N183 Chronic kidney disease, stage 3 unspecified: Secondary | ICD-10-CM

## 2013-10-25 DIAGNOSIS — I209 Angina pectoris, unspecified: Secondary | ICD-10-CM

## 2013-10-25 DIAGNOSIS — F419 Anxiety disorder, unspecified: Secondary | ICD-10-CM

## 2013-10-25 DIAGNOSIS — I25709 Atherosclerosis of coronary artery bypass graft(s), unspecified, with unspecified angina pectoris: Secondary | ICD-10-CM

## 2013-10-25 DIAGNOSIS — I25810 Atherosclerosis of coronary artery bypass graft(s) without angina pectoris: Secondary | ICD-10-CM

## 2013-10-25 DIAGNOSIS — Z951 Presence of aortocoronary bypass graft: Secondary | ICD-10-CM

## 2013-10-25 DIAGNOSIS — I25119 Atherosclerotic heart disease of native coronary artery with unspecified angina pectoris: Secondary | ICD-10-CM

## 2013-10-25 DIAGNOSIS — Z954 Presence of other heart-valve replacement: Secondary | ICD-10-CM

## 2013-10-25 DIAGNOSIS — E785 Hyperlipidemia, unspecified: Secondary | ICD-10-CM

## 2013-10-25 DIAGNOSIS — F411 Generalized anxiety disorder: Secondary | ICD-10-CM

## 2013-10-25 DIAGNOSIS — I2510 Atherosclerotic heart disease of native coronary artery without angina pectoris: Secondary | ICD-10-CM

## 2013-10-25 MED ORDER — IRBESARTAN 150 MG PO TABS: 150.0000 mg | ORAL_TABLET | Freq: Every day | ORAL | Status: DC

## 2013-10-25 MED ORDER — METOPROLOL SUCCINATE ER 50 MG PO TB24: 50.0000 mg | ORAL_TABLET | Freq: Every day | ORAL | Status: DC

## 2013-10-25 NOTE — Patient Instructions (Signed)
Your physician has recommended you make the following change in your medication:  1) STOP Amlodipine 2) START Avapro 150mg  daily. An Rx has been sent to your pharmacy 3) START Metoprolol Succinate 50mg  daily. An Rx has been sent to your pharmacy  Your physician recommends that you return for lab work in: 1 month  Your physician recommends that you schedule a follow-up appointment in: 1 month with the PA, or Dr.Smith

## 2013-10-25 NOTE — Progress Notes (Signed)
Patient ID: Max Byrd, male   DOB: [birth year], 58 y.o.   MRN: [id number]    [address], [place]  [address] Phone: [phone] Fax:  [phone]  Date:  10/25/2013   ID:  Max Byrd, DOB 02/13/1956, MRN [id number]  PCP:  Wenda Low, MD   ASSESSMENT:  1. status post aortic valve replacement with bioprosthetic valve 2. Chronic diastolic heart failure secondary to left ventricular hypertrophy from valvular heart disease 3. Allergic of edema related to amlodipine 4. Hypertension, severe and poorly controlled 5. Angina pectoris, markedly improved after DES implantation  PLAN:  1. discontinue amlodipine 2. Start Avapro 150 mg daily 3. Start metoprolol succinate 50 mg per day 4. Clinical followup in one month with a basic metabolic panel on return 5. On return visit blood pressure medications may need to be titrated upward   SUBJECTIVE: Max Byrd is a 58 y.o. male who is now several months post aortic valve replacement with a bioprosthesis. After surgery he had to undergo percutaneous intervention of the circumflex because of unstable angina. He received a drug-eluting stent.. he states he is continuing to have angina although he has not called in to tell is a dominant. It is markedly improved compared to prior. He continues to be concerned about the lack of energy, decreased exertional tolerance, and overall not feeling well. His appetite is been stable. He denies chills and fever.    Wt Readings from Last 3 Encounters:  10/25/13 173 lb (78.472 kg)  08/30/13 172 lb (78.019 kg)  08/21/13 172 lb (78.019 kg)     Past Medical History  Diagnosis Date  . Erectile dysfunction     INJECTIONS OF PROSTAGLANDIN BY UROLGIST  . CKD (chronic kidney disease) stage 2, GFR 60-89 ml/min   . Renal cell carcinoma of right kidney 2003  . H/O Bell's palsy 2010 X2  . Depression   . Anxiety   . Pain, chronic postoperative     S/P LEFT ARM SURGERY  . IBS (irritable bowel  syndrome)   . Bicuspid aortic valve     a. 01/2013 s/p AVR - 19mm Edwards 3300 TFX pericardial tissue valve, ser # [id number].  Marland Kitchen Essential hypertension, benign   . Hyperlipidemia   . Coronary atherosclerosis of native coronary artery     a. 01/2013 CABGx3: LIMA->LAD, VG->Diag, VG->OM (performed @ time of AVR),  occluded SVG-OM2 , now with 95% stenosis at the anastomosis site on OM 2 - PCI attempt 07/28/13 unsuccessful - med Rx cont'd   . Heart murmur   . Anginal pain 2015    "since valve was done"  . History of blood transfusion     "related to stomach bleeding from ASA & NSAIDS  . History of stomach ulcers   . Migraine     "went away in the 1990's"  . Arthritis     "left pointer finger; forminal openings bilaterally" (08/10/2013)    Current Outpatient Prescriptions  Medication Sig Dispense Refill  . ALPRAZolam (XANAX) 1 MG tablet Take 1 mg by mouth 4 (four) times daily.      Marland Kitchen amLODipine (NORVASC) 5 MG tablet Take 1 tablet (5 mg total) by mouth daily.  180 tablet  3  . aspirin EC 81 MG EC tablet Take 1 tablet (81 mg total) by mouth daily.      . carisoprodol (SOMA) 350 MG tablet Take 350 mg by mouth 2 (two) times daily.      . clindamycin (  CLEOCIN) 150 MG capsule       . clopidogrel (PLAVIX) 75 MG tablet Take 1 tablet (75 mg total) by mouth daily with breakfast.  30 tablet  11  . Diclofenac-Misoprostol (ARTHROTEC) 50-0.2 MG TBEC Take 1 tablet by mouth 2 (two) times daily.      . DULoxetine (CYMBALTA) 30 MG capsule Take 30 mg by mouth 2 (two) times daily.      Marland Kitchen escitalopram (LEXAPRO) 10 MG tablet Take 10 mg by mouth 2 (two) times daily.      . Eszopiclone (ESZOPICLONE) 3 MG TABS Take 3 mg by mouth at bedtime. Take immediately before bedtime      . furosemide (LASIX) 40 MG tablet Take 1 tablet (40 mg total) by mouth daily.  30 tablet  1  . Naphazoline HCl (CLEAR EYES OP) Place 1 drop into both eyes daily as needed (dry eyes).      . nitroGLYCERIN (NITROSTAT) 0.4 MG SL tablet Place 1  tablet (0.4 mg total) under the tongue every 5 (five) minutes as needed for chest pain.  25 tablet  1  . oxyCODONE-acetaminophen (PERCOCET) 10-325 MG per tablet Take 1 tablet by mouth every 4 (four) hours as needed for pain.      . OXYCONTIN 30 MG T12A Take 30 mg by mouth every 12 (twelve) hours.       . pantoprazole (PROTONIX) 40 MG tablet Take 1 tablet (40 mg total) by mouth daily.  30 tablet  11   No current facility-administered medications for this visit.    Allergies:    Allergies  Allergen Reactions  . Aspirin Other (See Comments)    H/O BLEEDING ULCER  . Buspirone Other (See Comments)    MEMORY LOSS  . Klonopin [Clonazepam] Other (See Comments)    Erectile dysfunction  . Nsaids Other (See Comments)    Stomach bleeding  . Zoloft [Sertraline Hcl] Other (See Comments)    FELT TERRIBLE  . Reglan [Metoclopramide] Anxiety    EXCITABILITY     Social History:  The patient  reports that he quit smoking about 37 years ago. His smoking use included Cigarettes. He has a 7.5 pack-year smoking history. His smokeless tobacco use includes Snuff. He reports that he drinks alcohol. He reports that he does not use illicit drugs.   ROS:  Please see the history of present illness.   Multiple somatic complaints   All other systems reviewed and negative.   OBJECTIVE: VS:  BP 150/98  Pulse 80  Ht 5\' 6"  (1.676 m)  Wt 173 lb (78.472 kg)  BMI 27.94 kg/m2 Well nourished, well developed, in no acute distress, healthy-appearing HEENT: normal Neck: JVD flat. Carotid bruit absent  Cardiac:  normal S1, S2; RRR; 3-5/3 systolic crescendo decrescendo murmur Lungs:  clear to auscultation bilaterally, no wheezing, rhonchi or rales Abd: soft, nontender, no hepatomegaly Ext: Edema trace bilateral. Pulses 2+ Skin: warm and dry Neuro:  CNs 2-12 intact, no focal abnormalities noted  EKG:  Not repeated       Signed, Illene Labrador III, MD 10/25/2013 1:50 PM

## 2013-11-01 DIAGNOSIS — M5412 Radiculopathy, cervical region: Secondary | ICD-10-CM | POA: Insufficient documentation

## 2013-11-01 DIAGNOSIS — F419 Anxiety disorder, unspecified: Secondary | ICD-10-CM | POA: Insufficient documentation

## 2013-11-01 DIAGNOSIS — I25810 Atherosclerosis of coronary artery bypass graft(s) without angina pectoris: Secondary | ICD-10-CM | POA: Insufficient documentation

## 2013-11-01 DIAGNOSIS — C801 Malignant (primary) neoplasm, unspecified: Secondary | ICD-10-CM | POA: Insufficient documentation

## 2013-11-01 DIAGNOSIS — N189 Chronic kidney disease, unspecified: Secondary | ICD-10-CM | POA: Insufficient documentation

## 2013-11-01 DIAGNOSIS — N529 Male erectile dysfunction, unspecified: Secondary | ICD-10-CM | POA: Insufficient documentation

## 2013-11-15 ENCOUNTER — Ambulatory Visit (INDEPENDENT_AMBULATORY_CARE_PROVIDER_SITE_OTHER): Payer: Federal, State, Local not specified - PPO | Admitting: Physician Assistant

## 2013-11-15 ENCOUNTER — Other Ambulatory Visit (INDEPENDENT_AMBULATORY_CARE_PROVIDER_SITE_OTHER): Payer: Federal, State, Local not specified - PPO

## 2013-11-15 ENCOUNTER — Other Ambulatory Visit: Payer: Self-pay | Admitting: *Deleted

## 2013-11-15 ENCOUNTER — Encounter: Payer: Self-pay | Admitting: Physician Assistant

## 2013-11-15 VITALS — BP 122/90 | HR 53 | Ht 66.0 in | Wt 176.1 lb

## 2013-11-15 DIAGNOSIS — I209 Angina pectoris, unspecified: Secondary | ICD-10-CM

## 2013-11-15 DIAGNOSIS — N183 Chronic kidney disease, stage 3 unspecified: Secondary | ICD-10-CM

## 2013-11-15 DIAGNOSIS — I10 Essential (primary) hypertension: Secondary | ICD-10-CM

## 2013-11-15 DIAGNOSIS — I5032 Chronic diastolic (congestive) heart failure: Secondary | ICD-10-CM | POA: Insufficient documentation

## 2013-11-15 DIAGNOSIS — I25810 Atherosclerosis of coronary artery bypass graft(s) without angina pectoris: Secondary | ICD-10-CM

## 2013-11-15 LAB — BASIC METABOLIC PANEL
BUN: 32 mg/dL — ABNORMAL HIGH (ref 6–23)
CO2: 29 mEq/L (ref 19–32)
Calcium: 8.8 mg/dL (ref 8.4–10.5)
Chloride: 107 mEq/L (ref 96–112)
Creatinine, Ser: 1.2 mg/dL (ref 0.4–1.5)
GFR: 68.02 mL/min (ref >60.00)
Glucose, Bld: 82 mg/dL (ref 70–99)
POTASSIUM: 4.6 meq/L (ref 3.5–5.1)
SODIUM: 139 meq/L (ref 135–145)

## 2013-11-15 MED ORDER — PANTOPRAZOLE SODIUM 40 MG PO TBEC: 40.0000 mg | DELAYED_RELEASE_TABLET | Freq: Every day | ORAL | Status: DC

## 2013-11-15 NOTE — Progress Notes (Signed)
HPI:Mr. Kley is a 58 year old male patient Dr. Tamala Julian who has history of coronary artery disease and bicuspid aortic valve with aortic stenosis status post CABG and bioprosthetic aVR, chronic kidney disease, renal cancer, hypertension and hyperlipidemia. He recently had class III angina and underwent cardiac cath that demonstrated patent LIMA to the LAD, SVG to the diagonal 1 and occluded SVG to the OM. The culprit was felt to be the 95% lesion in the OM branch because of the occluded SVG. Multiple attempts were made to engage the circumflex artery by Dr. Ellyn Hack that were unsuccessful and PCI was aborted. The creatinine remained stable. He was discharged home and readmitted for another attempt at PCI. This was performed by Dr. Daneen Schick and was a successful complicated prolonged PCI of the first OM with reduction of 99% stenosis to 0%. He also has chronic diastolic heart failure secondary to LVH from value meal her heart disease.  The patient saw Dr. Tamala Julian on 10/25/13 with elevated blood pressure. He had edema secondary to amlodipine. Amlodipine was stopped and Avapro 150 mg daily was started metoprolol 50 mg was started. The patient is here for followup blood pressure. He says  Lasix tears his stomach up so he stopped it 4 days ago and now has edema. His systolic blood pressure is  much better but his diastolic is still elevated. He did have blood work this morning which causes anxiety. He also needs preapproval for Protonix so his insurance will pay for it.   Allergies  Allergen Reactions  . Aspirin Other (See Comments)    H/O BLEEDING ULCER  . Buspirone Other (See Comments)    MEMORY LOSS  . Nsaids Other (See Comments)    Stomach bleeding  . Zoloft [Sertraline Hcl] Other (See Comments)    FELT TERRIBLE  . Klonopin [Clonazepam] Other (See Comments)    Erectile dysfunction  . Tolmetin Other (See Comments)    Stomach bleeding  . Reglan [Metoclopramide] Anxiety    EXCITABILITY      Current Outpatient Prescriptions  Medication Sig Dispense Refill  . ALPRAZolam (XANAX) 1 MG tablet Take 1 mg by mouth 4 (four) times daily.      Marland Kitchen aspirin EC 81 MG EC tablet Take 1 tablet (81 mg total) by mouth daily.      . carisoprodol (SOMA) 350 MG tablet Take 350 mg by mouth 2 (two) times daily.      . clindamycin (CLEOCIN) 150 MG capsule Take 150 mg by mouth as needed. As needed before dental work      . clopidogrel (PLAVIX) 75 MG tablet Take 1 tablet (75 mg total) by mouth daily with breakfast.  30 tablet  11  . Diclofenac-Misoprostol (ARTHROTEC) 50-0.2 MG TBEC Take 1 tablet by mouth 2 (two) times daily.      . DULoxetine (CYMBALTA) 30 MG capsule Take 30 mg by mouth 2 (two) times daily.      Marland Kitchen escitalopram (LEXAPRO) 10 MG tablet Take 10 mg by mouth 2 (two) times daily.      . Eszopiclone (ESZOPICLONE) 3 MG TABS Take 3 mg by mouth at bedtime. Take immediately before bedtime      . furosemide (LASIX) 40 MG tablet Take 1 tablet (40 mg total) by mouth daily.  30 tablet  1  . hyoscyamine (LEVSIN SL) 0.125 MG SL tablet Take 0.125 mg by mouth every 4 (four) hours as needed.       . irbesartan (AVAPRO) 150 MG tablet Take  1 tablet (150 mg total) by mouth daily.  30 tablet  5  . metoprolol succinate (TOPROL-XL) 50 MG 24 hr tablet Take 1 tablet (50 mg total) by mouth daily. Take with or immediately following a meal.  30 tablet  5  . Naphazoline HCl (CLEAR EYES OP) Place 1 drop into both eyes daily as needed (dry eyes).      . nitroGLYCERIN (NITROSTAT) 0.4 MG SL tablet Place 0.4 mg under the tongue every 5 (five) minutes as needed for chest pain (MAX 3 TABLETS).      Marland Kitchen oxyCODONE-acetaminophen (PERCOCET) 10-325 MG per tablet Take 1 tablet by mouth every 4 (four) hours as needed for pain.      . OXYCONTIN 30 MG T12A Take 30 mg by mouth every 12 (twelve) hours.       . pantoprazole (PROTONIX) 40 MG tablet Take 1 tablet (40 mg total) by mouth daily.  30 tablet  11  . sildenafil (VIAGRA) 100 MG tablet  Take 100 mg by mouth.       No current facility-administered medications for this visit.    Past Medical History  Diagnosis Date  . Erectile dysfunction     INJECTIONS OF PROSTAGLANDIN BY UROLGIST  . CKD (chronic kidney disease) stage 2, GFR 60-89 ml/min   . Renal cell carcinoma of right kidney 2003  . H/O Bell's palsy 2010 X2  . Depression   . Anxiety   . Pain, chronic postoperative     S/P LEFT ARM SURGERY  . IBS (irritable bowel syndrome)   . Bicuspid aortic valve     a. 01/2013 s/p AVR - 9mm Edwards 3300 TFX pericardial tissue valve, ser # [id number].  Marland Kitchen Essential hypertension, benign   . Hyperlipidemia   . Coronary atherosclerosis of native coronary artery     a. 01/2013 CABGx3: LIMA->LAD, VG->Diag, VG->OM (performed @ time of AVR),  occluded SVG-OM2 , now with 95% stenosis at the anastomosis site on OM 2 - PCI attempt 07/28/13 unsuccessful - med Rx cont'd   . Heart murmur   . Anginal pain 2015    "since valve was done"  . History of blood transfusion     "related to stomach bleeding from ASA & NSAIDS  . History of stomach ulcers   . Migraine     "went away in the 1990's"  . Arthritis     "left pointer finger; forminal openings bilaterally" (08/10/2013)    Past Surgical History  Procedure Laterality Date  . Colonoscopy  2008    DIVERTICULOSIS  . Anal sphincterotomy  04/13/03    DR. GRAPEY  . Partial nephrectomy  08/24/02    RIGHT...DR. Risa Grill  . Lateral epicondyle release Left 07/29/07    DR. GRAVES  . Tonsillectomy    . Aortic valve replacement N/A 02/14/2013    Procedure: AORTIC VALVE REPLACEMENT (AVR);  Surgeon: Ivin Poot, MD;  Location: [facility];  Service: Open Heart Surgery;  Laterality: N/A;  . Coronary artery bypass graft N/A 02/14/2013    Procedure: CORONARY ARTERY BYPASS GRAFTING (CABG);  Surgeon: Ivin Poot, MD;  Location: [facility];  Service: Open Heart Surgery;  Laterality: N/A;  . Intraoperative transesophageal echocardiogram N/A 02/14/2013     Procedure: INTRAOPERATIVE TRANSESOPHAGEAL ECHOCARDIOGRAM;  Surgeon: Ivin Poot, MD;  Location: [facility];  Service: Open Heart Surgery;  Laterality: N/A;  . Cardiac catheterization  2010; 6/12015; 07/29/2013  . Coronary angioplasty with stent placement  08/10/2013    "1"  . Excisional hemorrhoidectomy    .  Hernia repair Left 1957  . Elbow surgery Left X 2  . Elbow surgery Right X 1  . Toe surgery Right     "shaved; wired back straight"  . Cardiac valve replacement      Family History  Problem Relation Age of Onset  . Cancer Mother     BREAST/OVARIAN  . Heart disease Father     S/P MI/CABG    History   Social History  . Marital Status: Married    Spouse Name: N/A    Number of Children: N/A  . Years of Education: N/A   Occupational History  . Not on file.   Social History Main Topics  . Smoking status: Former Smoker -- 1.50 packs/day for 5 years    Types: Cigarettes    Quit date: 01/21/1976  . Smokeless tobacco: Current User    Types: Snuff  . Alcohol Use: Yes     Comment: "last beer was 01/2013"  . Drug Use: No  . Sexual Activity: Yes   Other Topics Concern  . Not on file   Social History Narrative   Was working out regularly prior to CABG/AVR.  [employer]unts/fishes.    ROS: Multiple GI complaints, asking for Phenergan  BP 122/90  Pulse 53  Ht 5\' 6"  (1.676 m)  Wt 176 lb 1.9 oz (79.888 kg)  BMI 28.44 kg/m2  PHYSICAL EXAM: Well-nournished, in no acute distress. Neck: No JVD, HJR, Bruit, or thyroid enlargement  Lungs: No tachypnea, clear without wheezing, rales, or rhonchi  Cardiovascular: RRR, PMI not displaced, positive S4, 2-9/7 systolic murmur at the left sternal border, no bruit, thrill, or heave.  Abdomen: BS normal. Soft without organomegaly, masses, lesions or tenderness.  Extremities: Bilateral ankle edema without cyanosis, clubbing . 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

## 2013-11-15 NOTE — Assessment & Plan Note (Signed)
Patient has 3 pound weight gain and edema since stopping his Lasix. Recommend resuming Lasix. Will offer authorization for his Protonix

## 2013-11-15 NOTE — Patient Instructions (Signed)
Your physician recommends that you continue on your current medications as directed. Please refer to the Current Medication list given to you today.   Your physician recommends that you schedule a follow-up appointment in:  DR [facility] 2 TO 3 MONTHS

## 2013-11-15 NOTE — Assessment & Plan Note (Signed)
Blood work pending today

## 2013-11-15 NOTE — Assessment & Plan Note (Signed)
Blood pressure has improved with the addition of Avapro and metoprolol. Patient unfortunately stopped his Lasix to 4 days ago and now has edema. Resume Lasix. Recommend he see a GI doctor for his multiple GI symptoms. Follow up with Dr. Tamala Julian in 2 months

## 2013-11-15 NOTE — Assessment & Plan Note (Signed)
Stable without chest pain 

## 2013-12-21 ENCOUNTER — Other Ambulatory Visit: Payer: Self-pay | Admitting: *Deleted

## 2013-12-21 MED ORDER — FUROSEMIDE 40 MG PO TABS: 40.0000 mg | ORAL_TABLET | Freq: Every day | ORAL | Status: DC

## 2013-12-28 DIAGNOSIS — G8929 Other chronic pain: Secondary | ICD-10-CM | POA: Insufficient documentation

## 2014-01-16 ENCOUNTER — Other Ambulatory Visit: Payer: Self-pay | Admitting: Interventional Cardiology

## 2014-01-23 ENCOUNTER — Telehealth: Payer: Self-pay | Admitting: Interventional Cardiology

## 2014-01-23 NOTE — Telephone Encounter (Signed)
New message     Checking status on cardiac rehab referral.  Did we get the fax?

## 2014-01-25 ENCOUNTER — Encounter (HOSPITAL_COMMUNITY): Payer: Self-pay | Admitting: Interventional Cardiology

## 2014-01-25 NOTE — Telephone Encounter (Signed)
Returned  Max Byrd @ [facility] cardiac rehab call. Adv her that we have not received a rqst for cardiac rehab ref for pt. She will fax over rqst today

## 2014-02-01 NOTE — Telephone Encounter (Signed)
Signed ref for [facility] cardiac rehab left in Nurse fax box in MR

## 2014-02-05 ENCOUNTER — Telehealth: Payer: Self-pay | Admitting: Interventional Cardiology

## 2014-02-05 NOTE — Telephone Encounter (Signed)
New Msg    Pt would like to know if he is eligible to go through rehab again, he hasn't felt the same since Bypass collapse.    Pt states request has been sent from [facility].  Please call at [phone].

## 2014-02-05 NOTE — Telephone Encounter (Signed)
Pt advised signed referral for [facility] done 02/01/14 and returned to nurse box to be faxed.

## 2014-02-06 ENCOUNTER — Telehealth: Payer: Self-pay | Admitting: Interventional Cardiology

## 2014-02-06 NOTE — Telephone Encounter (Signed)
Telephone note indicates signed referral for Cardiac Rehab placed in nurse fax box in HIM 02/01/14. Will forward to HIM to follow up with Sunday Spillers at [facility].

## 2014-02-06 NOTE — Telephone Encounter (Signed)
New Msg      Max Byrd of [facility] cardiac rehab calling to see about a referring signature for pt to enter the program there.   Please return call at [phone].

## 2014-02-19 NOTE — Telephone Encounter (Signed)
Ref refaxed to fax # provided

## 2014-02-19 NOTE — Telephone Encounter (Signed)
Follow up      Pt is supposed to start cardiac rehab tomorrow am----need referral from Dr Tamala Julian.  Please fax ok to [phone].

## 2014-02-22 ENCOUNTER — Encounter: Payer: Self-pay | Admitting: Hematology & Oncology

## 2014-03-07 ENCOUNTER — Telehealth: Payer: Self-pay | Admitting: Hematology & Oncology

## 2014-03-07 NOTE — Telephone Encounter (Signed)
I spoke w NEW PATIENT today to remind them of their appointment with Dr. Marin Olp. Also, advised them to bring all medication bottles and insurance card information.  However, pt was kinda hesitant to bring his med bottle and said we will not find illegal drugs in his system. Huh???

## 2014-03-08 ENCOUNTER — Other Ambulatory Visit (HOSPITAL_BASED_OUTPATIENT_CLINIC_OR_DEPARTMENT_OTHER): Payer: Federal, State, Local not specified - PPO | Admitting: Lab

## 2014-03-08 ENCOUNTER — Encounter: Payer: Self-pay | Admitting: Family

## 2014-03-08 ENCOUNTER — Other Ambulatory Visit: Payer: Self-pay | Admitting: Family

## 2014-03-08 ENCOUNTER — Ambulatory Visit: Payer: Federal, State, Local not specified - PPO

## 2014-03-08 ENCOUNTER — Ambulatory Visit (HOSPITAL_BASED_OUTPATIENT_CLINIC_OR_DEPARTMENT_OTHER): Payer: Federal, State, Local not specified - PPO | Admitting: Family

## 2014-03-08 VITALS — BP 117/78 | HR 50 | Temp 97.6°F | Resp 18 | Ht 66.0 in | Wt 168.0 lb

## 2014-03-08 DIAGNOSIS — I10 Essential (primary) hypertension: Secondary | ICD-10-CM

## 2014-03-08 DIAGNOSIS — Z8553 Personal history of malignant neoplasm of renal pelvis: Secondary | ICD-10-CM

## 2014-03-08 DIAGNOSIS — D696 Thrombocytopenia, unspecified: Secondary | ICD-10-CM

## 2014-03-08 DIAGNOSIS — R233 Spontaneous ecchymoses: Secondary | ICD-10-CM

## 2014-03-08 DIAGNOSIS — Z803 Family history of malignant neoplasm of breast: Secondary | ICD-10-CM

## 2014-03-08 DIAGNOSIS — R0602 Shortness of breath: Secondary | ICD-10-CM

## 2014-03-08 LAB — CBC WITH DIFFERENTIAL (CANCER CENTER ONLY)
BASO#: 0 10*3/uL (ref 0.0–0.2)
BASO%: 0.2 % (ref 0.0–2.0)
EOS%: 1.3 % (ref 0.0–7.0)
Eosinophils Absolute: 0.1 10*3/uL (ref 0.0–0.5)
HEMATOCRIT: 39.2 % (ref 38.7–49.9)
HEMOGLOBIN: 14.3 g/dL (ref 13.0–17.1)
LYMPH#: 0.8 10*3/uL — ABNORMAL LOW (ref 0.9–3.3)
LYMPH%: 12.4 % — AB (ref 14.0–48.0)
MCH: 33.7 pg — ABNORMAL HIGH (ref 28.0–33.4)
MCHC: 36.5 g/dL — ABNORMAL HIGH (ref 32.0–35.9)
MCV: 93 fL (ref 82–98)
MONO#: 0.3 10*3/uL (ref 0.1–0.9)
MONO%: 4.6 % (ref 0.0–13.0)
NEUT%: 81.5 % — AB (ref 40.0–80.0)
NEUTROS ABS: 5.2 10*3/uL (ref 1.5–6.5)
Platelets: 113 10*3/uL — ABNORMAL LOW (ref 145–400)
RBC: 4.24 10*6/uL (ref 4.20–5.70)
RDW: 12.4 % (ref 11.1–15.7)
WBC: 6.4 10*3/uL (ref 4.0–10.0)

## 2014-03-08 LAB — CMP (CANCER CENTER ONLY)
ALT(SGPT): 23 U/L (ref 10–47)
AST: 24 U/L (ref 11–38)
Albumin: 3.6 g/dL (ref 3.3–5.5)
Alkaline Phosphatase: 51 U/L (ref 26–84)
BILIRUBIN TOTAL: 1 mg/dL (ref 0.20–1.60)
BUN, Bld: 27 mg/dL — ABNORMAL HIGH (ref 7–22)
CALCIUM: 8.8 mg/dL (ref 8.0–10.3)
CO2: 30 mEq/L (ref 18–33)
Chloride: 101 mEq/L (ref 98–108)
Creat: 1.4 mg/dl — ABNORMAL HIGH (ref 0.6–1.2)
GLUCOSE: 87 mg/dL (ref 73–118)
Potassium: 4.2 mEq/L (ref 3.3–4.7)
Sodium: 139 mEq/L (ref 128–145)
Total Protein: 5.9 g/dL — ABNORMAL LOW (ref 6.4–8.1)

## 2014-03-08 LAB — CHCC SATELLITE - SMEAR

## 2014-03-08 NOTE — Progress Notes (Signed)
Hematology/Oncology Consultation   Name: ANTON CHERAMIE      MRN: [id number]    Location: Room/bed info not found  Date: 03/08/2014 Time:12:50 PM   REFERRING PHYSICIAN: Wenda Low  REASON FOR CONSULT:  Thrombocytopenia   DIAGNOSIS: Medication induced thrombocytopenia  HISTORY OF PRESENT ILLNESS: Mr. Mamone is a very pleasant 59 yo male with a significant cardiac history. He had an AVR and CABG (which failed) in December 2014. He has had HTN since he was in his mid 20's. He is currently on Plavix for the AVR and Toprolol-XL, Avapro and lasix for the HTN. His is in cardiac rehab for the third time. He has really had a hard time recuperating this last year. He is fatigued and had to take early retirement with his job because he was unable to do his work without becoming extremely fatigued. He has SOB with any exertion. He also has petechia on his arms and shoulders that will sometimes bleed after he showers and is drying off. He states that since adding the Avapro and lasix to his med regimen his BP is under much better control.  His father and mother also had HTN. His father had a stroke during an aortic ulcer repair last year and passed.  He has a history of renal cell carcinoma and an open partial right nephrectomy. He had scans for 10 years before being released by Dr. Risa Grill. His mother had both breast and ovarian cancer.  He states that his PSA was checked in September and was normal.  He does rub snuff but he does not drink alcohol.  He denies fever, chills, n/v, cough, headache, dizziness, chest pain, palpitations, abdominal pain, constipation, diarrhea, blood in urine or stool.  No swelling, tenderness, numbness or tingling in his extremities. His appetite is good and he is staying hydrated. His weight is stable at 168 lbs.   ROS: All other 10 point review of systems is negative.   PAST MEDICAL HISTORY:   Past Medical History  Diagnosis Date  . Erectile dysfunction     INJECTIONS OF  PROSTAGLANDIN BY UROLGIST  . CKD (chronic kidney disease) stage 2, GFR 60-89 ml/min   . Renal cell carcinoma of right kidney 2003  . H/O Bell's palsy 2010 X2  . Depression   . Anxiety   . Pain, chronic postoperative     S/P LEFT ARM SURGERY  . IBS (irritable bowel syndrome)   . Bicuspid aortic valve     a. 01/2013 s/p AVR - 39mm Edwards 3300 TFX pericardial tissue valve, ser # [id number].  Marland Kitchen Essential hypertension, benign   . Hyperlipidemia   . Coronary atherosclerosis of native coronary artery     a. 01/2013 CABGx3: LIMA->LAD, VG->Diag, VG->OM (performed @ time of AVR),  occluded SVG-OM2 , now with 95% stenosis at the anastomosis site on OM 2 - PCI attempt 07/28/13 unsuccessful - med Rx cont'd   . Heart murmur   . Anginal pain 2015    "since valve was done"  . History of blood transfusion     "related to stomach bleeding from ASA & NSAIDS  . History of stomach ulcers   . Migraine     "went away in the 1990's"  . Arthritis     "left pointer finger; forminal openings bilaterally" (08/10/2013)    ALLERGIES: Allergies  Allergen Reactions  . Aspirin Other (See Comments)    H/O BLEEDING ULCER  . Buspirone Other (See Comments)    MEMORY LOSS  . Nsaids  Other (See Comments)    Stomach bleeding  . Zoloft [Sertraline Hcl] Other (See Comments)    FELT TERRIBLE  . Klonopin [Clonazepam] Other (See Comments)    Erectile dysfunction  . Tolmetin Other (See Comments)    Stomach bleeding  . Reglan [Metoclopramide] Anxiety    EXCITABILITY       MEDICATIONS:  Current Outpatient Prescriptions on File Prior to Visit  Medication Sig Dispense Refill  . ALPRAZolam (XANAX) 1 MG tablet Take 1 mg by mouth 4 (four) times daily.    Marland Kitchen aspirin EC 81 MG EC tablet Take 1 tablet (81 mg total) by mouth daily.    . carisoprodol (SOMA) 350 MG tablet Take 350 mg by mouth 2 (two) times daily.    . clopidogrel (PLAVIX) 75 MG tablet Take 1 tablet (75 mg total) by mouth daily with breakfast. 30 tablet 11  .  Diclofenac-Misoprostol (ARTHROTEC) 50-0.2 MG TBEC Take 1 tablet by mouth 2 (two) times daily.    . DULoxetine (CYMBALTA) 30 MG capsule Take 30 mg by mouth 2 (two) times daily.    Marland Kitchen escitalopram (LEXAPRO) 10 MG tablet Take 10 mg by mouth 2 (two) times daily.    . Eszopiclone (ESZOPICLONE) 3 MG TABS Take 3 mg by mouth at bedtime. Take immediately before bedtime    . furosemide (LASIX) 40 MG tablet TAKE ONE TABLET BY MOUTH ONCE DAILY 30 tablet 1  . hyoscyamine (LEVSIN SL) 0.125 MG SL tablet Take 0.125 mg by mouth every 4 (four) hours as needed.     . irbesartan (AVAPRO) 150 MG tablet Take 1 tablet (150 mg total) by mouth daily. 30 tablet 5  . metoprolol succinate (TOPROL-XL) 50 MG 24 hr tablet Take 1 tablet (50 mg total) by mouth daily. Take with or immediately following a meal. 30 tablet 5  . Naphazoline HCl (CLEAR EYES OP) Place 1 drop into both eyes daily as needed (dry eyes).    . nitroGLYCERIN (NITROSTAT) 0.4 MG SL tablet Place 0.4 mg under the tongue every 5 (five) minutes as needed for chest pain (MAX 3 TABLETS).    Marland Kitchen oxyCODONE-acetaminophen (PERCOCET) 10-325 MG per tablet Take 1 tablet by mouth every 4 (four) hours as needed for pain.    . OXYCONTIN 30 MG T12A Take 30 mg by mouth every 12 (twelve) hours.     . pantoprazole (PROTONIX) 40 MG tablet Take 1 tablet (40 mg total) by mouth daily. 30 tablet 11  . sildenafil (VIAGRA) 100 MG tablet Take 100 mg by mouth.    . clindamycin (CLEOCIN) 150 MG capsule Take 150 mg by mouth as needed. As needed before dental work     No current facility-administered medications on file prior to visit.     PAST SURGICAL HISTORY Past Surgical History  Procedure Laterality Date  . Colonoscopy  2008    DIVERTICULOSIS  . Anal sphincterotomy  04/13/03    DR. GRAPEY  . Partial nephrectomy  08/24/02    RIGHT...DR. Risa Grill  . Lateral epicondyle release Left 07/29/07    DR. GRAVES  . Tonsillectomy    . Aortic valve replacement N/A 02/14/2013    Procedure: AORTIC  VALVE REPLACEMENT (AVR);  Surgeon: Ivin Poot, MD;  Location: [place];  Service: Open Heart Surgery;  Laterality: N/A;  . Coronary artery bypass graft N/A 02/14/2013    Procedure: CORONARY ARTERY BYPASS GRAFTING (CABG);  Surgeon: Ivin Poot, MD;  Location: [place];  Service: Open Heart Surgery;  Laterality: N/A;  .  Intraoperative transesophageal echocardiogram N/A 02/14/2013    Procedure: INTRAOPERATIVE TRANSESOPHAGEAL ECHOCARDIOGRAM;  Surgeon: Ivin Poot, MD;  Location: [place];  Service: Open Heart Surgery;  Laterality: N/A;  . Cardiac catheterization  2010; 6/12015; 07/29/2013  . Coronary angioplasty with stent placement  08/10/2013    "1"  . Excisional hemorrhoidectomy    . Hernia repair Left 1957  . Elbow surgery Left X 2  . Elbow surgery Right X 1  . Toe surgery Right     "shaved; wired back straight"  . Cardiac valve replacement    . Left and right heart catheterization with coronary angiogram N/A 01/27/2013    Procedure: LEFT AND RIGHT HEART CATHETERIZATION WITH CORONARY ANGIOGRAM;  Surgeon: Sinclair Grooms, MD;  Location: [facility] CATH LAB;  Service: Cardiovascular;  Laterality: N/A;  . Left heart catheterization with coronary/graft angiogram N/A 07/28/2013    Procedure: LEFT HEART CATHETERIZATION WITH Beatrix Fetters;  Surgeon: Leonie Man, MD;  Location: [facility] CATH LAB;  Service: Cardiovascular;  Laterality: N/A;  . Percutaneous stent intervention N/A 08/10/2013    Procedure: PERCUTANEOUS STENT INTERVENTION;  Surgeon: Sinclair Grooms, MD;  Location: [facility] CATH LAB;  Service: Cardiovascular;  Laterality: N/A;    FAMILY HISTORY: Family History  Problem Relation Age of Onset  . Cancer Mother     BREAST/OVARIAN  . Heart disease Father     S/P MI/CABG    SOCIAL HISTORY:  reports that he quit smoking about 38 years ago. His smoking use included Cigarettes. He started smoking about 44 years ago. He has a 9 pack-year smoking history. His smokeless tobacco use includes  Snuff. He reports that he drinks alcohol. He reports that he does not use illicit drugs.  PERFORMANCE STATUS: The patient's performance status is 1 - Symptomatic but completely ambulatory  PHYSICAL EXAM: Most Recent Vital Signs: Blood pressure 117/78, pulse 50, temperature 97.6 F (36.4 C), temperature source Oral, resp. rate 18, height 5\' 6"  (1.676 m), weight 168 lb (76.204 kg). BP 117/78 mmHg  Pulse 50  Temp(Src) 97.6 F (36.4 C) (Oral)  Resp 18  Ht 5\' 6"  (1.676 m)  Wt 168 lb (76.204 kg)  BMI 27.13 kg/m2  General Appearance:    Alert, cooperative, no distress, appears stated age  Head:    Normocephalic, without obvious abnormality, atraumatic  Eyes:    PERRL, conjunctiva/corneas clear, EOM's intact, fundi    benign, both eyes             Throat:   Lips, mucosa, and tongue normal; teeth and gums normal  Neck:   Supple, symmetrical, trachea midline, no adenopathy;       thyroid:  No enlargement/tenderness/nodules; no carotid   bruit or JVD  Back:     Symmetric, no curvature, ROM normal, no CVA tenderness  Lungs:     Clear to auscultation bilaterally, respirations unlabored  Chest wall:    No tenderness or deformity  Heart:    Regular rate and rhythm, S1 and S2 normal, no murmur, rub   or gallop  Abdomen:     Soft, non-tender, bowel sounds active all four quadrants,    no masses, no organomegaly        Extremities:   Extremities normal, atraumatic, no cyanosis or edema  Pulses:   2+ and symmetric all extremities  Skin:   Skin color, texture, turgor normal, no rashes or lesions  Lymph nodes:   Cervical, supraclavicular, and axillary nodes normal  Neurologic:   CNII-XII intact.  Normal strength, sensation and reflexes      throughout    LABORATORY DATA:  Results for orders placed or performed in visit on 03/08/14 (from the past 48 hour(s))  CBC with Differential [facility])     Status: Abnormal   Collection Time: 03/08/14 12:30 PM  Result Value Ref Range   WBC 6.4 4.0  - 10.0 10e3/uL   RBC 4.24 4.20 - 5.70 10e6/uL   HGB 14.3 13.0 - 17.1 g/dL   HCT 39.2 38.7 - 49.9 %   MCV 93 82 - 98 fL   MCH 33.7 (H) 28.0 - 33.4 pg   MCHC 36.5 (H) 32.0 - 35.9 g/dL   RDW 12.4 11.1 - 15.7 %   Platelets 113 (L) 145 - 400 10e3/uL   NEUT# 5.2 1.5 - 6.5 10e3/uL   LYMPH# 0.8 (L) 0.9 - 3.3 10e3/uL   MONO# 0.3 0.1 - 0.9 10e3/uL   Eosinophils Absolute 0.1 0.0 - 0.5 10e3/uL   BASO# 0.0 0.0 - 0.2 10e3/uL   NEUT% 81.5 (H) 40.0 - 80.0 %   LYMPH% 12.4 (L) 14.0 - 48.0 %   MONO% 4.6 0.0 - 13.0 %   EOS% 1.3 0.0 - 7.0 %   BASO% 0.2 0.0 - 2.0 %  [facility] - Smear     Status: None   Collection Time: 03/08/14 12:30 PM  Result Value Ref Range   Smear Result Smear Available       RADIOGRAPHY: No results found.     PATHOLOGY: None   ASSESSMENT/PLAN: Mr. Hollett is a very pleasant 59 yo male with a significant cardiac history. He had an AVR and CABG (which failed) in December 2014. He has had HTN since he was in his mid 20's. He is currently on Plavix for the AVR and Toprolol-XL, Avapro and lasix for the HTN. His is in cardiac rehab for the third time. He has really had a hard time recuperating this last year. He is fatigued and had to take early retirement with his job because he was unable to do his work without becoming extremely fatigued. He has SOB with any exertion. He also has petechia on his arms and shoulders that will sometimes bleed after he showers and is drying off. His platelets today are 113, Hgb 14.3 and MCV 93. Dr, Marin Olp will review his smear on Monday.  I feel that this is likely medication induced thrombocytopenia due to the Plavix and possibly the Lasix.  We will see what the rest of his lab work shows and then determine when to see him back.   All questions were answered. He knows to call the clinic with any problems, questions or concerns. We can certainly see him much sooner if necessary.  [facility]

## 2014-03-15 ENCOUNTER — Other Ambulatory Visit: Payer: Self-pay | Admitting: Family

## 2014-03-15 ENCOUNTER — Telehealth: Payer: Self-pay | Admitting: Hematology & Oncology

## 2014-03-15 ENCOUNTER — Encounter: Payer: Self-pay | Admitting: Interventional Cardiology

## 2014-03-15 ENCOUNTER — Ambulatory Visit (INDEPENDENT_AMBULATORY_CARE_PROVIDER_SITE_OTHER): Payer: Federal, State, Local not specified - PPO | Admitting: Interventional Cardiology

## 2014-03-15 VITALS — BP 122/68 | HR 55 | Ht 66.0 in | Wt 171.0 lb

## 2014-03-15 DIAGNOSIS — I10 Essential (primary) hypertension: Secondary | ICD-10-CM

## 2014-03-15 DIAGNOSIS — Z952 Presence of prosthetic heart valve: Secondary | ICD-10-CM

## 2014-03-15 DIAGNOSIS — E785 Hyperlipidemia, unspecified: Secondary | ICD-10-CM

## 2014-03-15 DIAGNOSIS — Z954 Presence of other heart-valve replacement: Secondary | ICD-10-CM

## 2014-03-15 DIAGNOSIS — D696 Thrombocytopenia, unspecified: Secondary | ICD-10-CM

## 2014-03-15 DIAGNOSIS — I25810 Atherosclerosis of coronary artery bypass graft(s) without angina pectoris: Secondary | ICD-10-CM

## 2014-03-15 DIAGNOSIS — I2510 Atherosclerotic heart disease of native coronary artery without angina pectoris: Secondary | ICD-10-CM

## 2014-03-15 NOTE — Telephone Encounter (Signed)
Mailed may schedule

## 2014-03-15 NOTE — Patient Instructions (Signed)
Your physician recommends that you continue on your current medications as directed. Please refer to the Current Medication list given to you today.  Your physician recommends that you return for a FASTING lipid profile in 6 months   Your physician wants you to follow-up in: 6 months with Dr.Smith You will receive a reminder letter in the mail two months in advance. If you don't receive a letter, please call our office to schedule the follow-up appointment.

## 2014-03-15 NOTE — Progress Notes (Signed)
Patient ID: Max Byrd, male   DOB: Nov 12, 1955, 59 y.o.   MRN: [id number]    Cardiology Office Note   Date:  03/15/2014   ID:  Max Byrd, DOB 1956/01/22, MRN [id number]  PCP:  Wenda Low, MD  Cardiologist:   Sinclair Grooms, MD   No chief complaint on file.     History of Present Illness: Max Byrd is a 59 y.o. male who presents for CAD and AVR. He had bypass graft failure after surgery had circumflex stent implantation. He has had limiting exertional fatigue and muscle weakness since surgery. He denies angina. He denies dyspnea and orthopnea. He has had no palpitations or syncope. He feels that he is alive but not able to live because there many things he can no longer do related to energy and muscle weakness.     Past Medical History  Diagnosis Date  . Erectile dysfunction     INJECTIONS OF PROSTAGLANDIN BY UROLGIST  . CKD (chronic kidney disease) stage 2, GFR 60-89 ml/min   . Renal cell carcinoma of right kidney 2003  . H/O Bell's palsy 2010 X2  . Depression   . Anxiety   . Pain, chronic postoperative     S/P LEFT ARM SURGERY  . IBS (irritable bowel syndrome)   . Bicuspid aortic valve     a. 01/2013 s/p AVR - 52mm Edwards 3300 TFX pericardial tissue valve, ser # [id number].  Marland Kitchen Essential hypertension, benign   . Hyperlipidemia   . Coronary atherosclerosis of native coronary artery     a. 01/2013 CABGx3: LIMA->LAD, VG->Diag, VG->OM (performed @ time of AVR),  occluded SVG-OM2 , now with 95% stenosis at the anastomosis site on OM 2 - PCI attempt 07/28/13 unsuccessful - med Rx cont'd   . Heart murmur   . Anginal pain 2015    "since valve was done"  . History of blood transfusion     "related to stomach bleeding from ASA & NSAIDS  . History of stomach ulcers   . Migraine     "went away in the 1990's"  . Arthritis     "left pointer finger; forminal openings bilaterally" (08/10/2013)    Past Surgical History  Procedure Laterality Date  . Colonoscopy  2008   DIVERTICULOSIS  . Anal sphincterotomy  04/13/03    DR. GRAPEY  . Partial nephrectomy  08/24/02    RIGHT...DR. Risa Grill  . Lateral epicondyle release Left 07/29/07    DR. GRAVES  . Tonsillectomy    . Aortic valve replacement N/A 02/14/2013    Procedure: AORTIC VALVE REPLACEMENT (AVR);  Surgeon: Ivin Poot, MD;  Location: [place];  Service: Open Heart Surgery;  Laterality: N/A;  . Coronary artery bypass graft N/A 02/14/2013    Procedure: CORONARY ARTERY BYPASS GRAFTING (CABG);  Surgeon: Ivin Poot, MD;  Location: [place];  Service: Open Heart Surgery;  Laterality: N/A;  . Intraoperative transesophageal echocardiogram N/A 02/14/2013    Procedure: INTRAOPERATIVE TRANSESOPHAGEAL ECHOCARDIOGRAM;  Surgeon: Ivin Poot, MD;  Location: [place];  Service: Open Heart Surgery;  Laterality: N/A;  . Cardiac catheterization  2010; 6/12015; 07/29/2013  . Coronary angioplasty with stent placement  08/10/2013    "1"  . Excisional hemorrhoidectomy    . Hernia repair Left 1957  . Elbow surgery Left X 2  . Elbow surgery Right X 1  . Toe surgery Right     "shaved; wired back straight"  . Cardiac valve replacement    . Left and  right heart catheterization with coronary angiogram N/A 01/27/2013    Procedure: LEFT AND RIGHT HEART CATHETERIZATION WITH CORONARY ANGIOGRAM;  Surgeon: Sinclair Grooms, MD;  Location: [facility] CATH LAB;  Service: Cardiovascular;  Laterality: N/A;  . Left heart catheterization with coronary/graft angiogram N/A 07/28/2013    Procedure: LEFT HEART CATHETERIZATION WITH Beatrix Fetters;  Surgeon: Leonie Man, MD;  Location: [facility] CATH LAB;  Service: Cardiovascular;  Laterality: N/A;  . Percutaneous stent intervention N/A 08/10/2013    Procedure: PERCUTANEOUS STENT INTERVENTION;  Surgeon: Sinclair Grooms, MD;  Location: [facility] CATH LAB;  Service: Cardiovascular;  Laterality: N/A;     Current Outpatient Prescriptions  Medication Sig Dispense Refill  . ALPRAZolam (XANAX) 1 MG tablet  Take 1 mg by mouth 4 (four) times daily.    Marland Kitchen aspirin EC 81 MG EC tablet Take 1 tablet (81 mg total) by mouth daily.    . carisoprodol (SOMA) 350 MG tablet Take 350 mg by mouth 2 (two) times daily.    . clindamycin (CLEOCIN) 150 MG capsule Take 150 mg by mouth as needed. As needed before dental work    . clopidogrel (PLAVIX) 75 MG tablet Take 1 tablet (75 mg total) by mouth daily with breakfast. 30 tablet 11  . Diclofenac-Misoprostol (ARTHROTEC) 50-0.2 MG TBEC Take 1 tablet by mouth 2 (two) times daily.    . DULoxetine (CYMBALTA) 30 MG capsule Take 30 mg by mouth 2 (two) times daily.    Marland Kitchen escitalopram (LEXAPRO) 10 MG tablet Take 10 mg by mouth 2 (two) times daily.    . Eszopiclone (ESZOPICLONE) 3 MG TABS Take 3 mg by mouth at bedtime. Take immediately before bedtime    . furosemide (LASIX) 40 MG tablet TAKE ONE TABLET BY MOUTH ONCE DAILY 30 tablet 1  . hyoscyamine (LEVSIN SL) 0.125 MG SL tablet Take 0.125 mg by mouth every 4 (four) hours as needed.     . irbesartan (AVAPRO) 150 MG tablet Take 1 tablet (150 mg total) by mouth daily. 30 tablet 5  . loperamide (IMODIUM) 2 MG capsule Take 2 mg by mouth as needed for diarrhea or loose stools.    . metoprolol succinate (TOPROL-XL) 50 MG 24 hr tablet Take 1 tablet (50 mg total) by mouth daily. Take with or immediately following a meal. 30 tablet 5  . Naphazoline HCl (CLEAR EYES OP) Place 1 drop into both eyes daily as needed (dry eyes).    . nitroGLYCERIN (NITROSTAT) 0.4 MG SL tablet Place 0.4 mg under the tongue every 5 (five) minutes as needed for chest pain (MAX 3 TABLETS).    Marland Kitchen oxyCODONE-acetaminophen (PERCOCET) 10-325 MG per tablet Take 1 tablet by mouth every 4 (four) hours as needed for pain.    . OXYCONTIN 30 MG T12A Take 30 mg by mouth every 12 (twelve) hours.     . pantoprazole (PROTONIX) 40 MG tablet Take 1 tablet (40 mg total) by mouth daily. 30 tablet 11  . sildenafil (VIAGRA) 100 MG tablet Take 100 mg by mouth.    . simethicone (MYLICON)  097 MG chewable tablet Chew 125 mg by mouth every 6 (six) hours as needed for flatulence.     No current facility-administered medications for this visit.    Allergies:   Aspirin; Buspirone; Nsaids; Zoloft; Klonopin; Tolmetin; and Reglan    Social History:  The patient  reports that he quit smoking about 38 years ago. His smoking use included Cigarettes. He started smoking about 44 years ago. He  has a 9 pack-year smoking history. His smokeless tobacco use includes Snuff. He reports that he drinks alcohol. He reports that he does not use illicit drugs.   Family History:  The patient's family history includes Cancer in his mother; Heart disease in his father.    ROS:  Please see the history of present illness.   Otherwise, review of systems are positive for easy bleeding and concern about low platelets. They're now down under 115,000. He is seeing Dr. Jonette Eva for this problem..   All other systems are reviewed and negative.    PHYSICAL EXAM: VS:  BP 122/68 mmHg  Pulse 55  Ht 5\' 6"  (1.676 m)  Wt 171 lb (77.565 kg)  BMI 27.61 kg/m2 , BMI Body mass index is 27.61 kg/(m^2). GEN: Well nourished, well developed, in no acute distress HEENT: normal Neck: no JVD, carotid bruits, or masses Cardiac: 3/6 crescendo decrescendo systolic murmur at right upper sternal border. No diastolic murmur and no edema  Respiratory:  clear to auscultation bilaterally, normal work of breathing GI: soft, nontender, nondistended, + BS MS: no deformity or atrophy Skin: warm and dry, no rash Neuro:  Strength and sensation are intact Psych: euthymic mood, full affect   EKG:  EKG is ordered today. The EKG demonstrates QS pattern V1 through the 4. Left axis deviation. Intraventricular conduction delay. Sinus bradycardia and biatrial abnormality. This is similar to prior tracings.    Recent Labs: 03/08/2014: ALT 23; BUN 27*; Creatinine 1.4*; Hemoglobin 14.3; Platelets 113*; Potassium 4.2; Sodium 139    Lipid  Panel No results found for: CHOL, TRIG, HDL, CHOLHDL, VLDL, LDLCALC, LDLDIRECT    Wt Readings from Last 3 Encounters:  03/15/14 171 lb (77.565 kg)  03/08/14 168 lb (76.204 kg)  11/15/13 176 lb 1.9 oz (79.888 kg)      Other studies Reviewed: Additional studies/ records that were reviewed today include: None. Review of the above records demonstrates:    ASSESSMENT AND PLAN:  1.  Status post aortic valve replacement with bioprosthesis. Because of exertional fatigue I have some concern that there may be relative aortic stenosis due to valve prosthesis size. 2. Coronary artery disease with bypass graft failure resulting in early post CABG circumflex stent. He is asymptomatic with reference to angina. 3. Hypertension, essential, under good control 4. Hyperlipidemia, on no therapy 5. Thrombocytopenia, etiology uncertain. Platelet count is still greater than 100,000.   Current medicines are reviewed at length with the patient today.  The patient has concerns regarding medicines and overall condition. He wonders if he would be a candidate for anabolic steroid therapy. I cautioned against this and a vigorous manner.  The following changes have been made:  no change  Labs/ tests ordered today include:  No orders of the defined types were placed in this encounter.     Disposition:   FU with Linard Millers in 6 months   Signed, Sinclair Grooms, MD  03/15/2014 8:20 AM    [facility] [address], [place], [place]  [address] Phone: [phone]; Fax: [phone]

## 2014-03-26 ENCOUNTER — Other Ambulatory Visit: Payer: Self-pay | Admitting: Interventional Cardiology

## 2014-04-09 ENCOUNTER — Encounter: Payer: Self-pay | Admitting: Interventional Cardiology

## 2014-04-09 ENCOUNTER — Other Ambulatory Visit: Payer: Self-pay | Admitting: Interventional Cardiology

## 2014-04-10 ENCOUNTER — Telehealth: Payer: Self-pay

## 2014-04-10 NOTE — Telephone Encounter (Signed)
-----   Message from Sinclair Grooms, MD sent at 04/09/2014  7:14 PM EST ----- Lipids are acceptable.

## 2014-04-10 NOTE — Telephone Encounter (Signed)
lmom.Lipids are acceptable.

## 2014-04-19 DIAGNOSIS — Z0271 Encounter for disability determination: Secondary | ICD-10-CM

## 2014-04-27 ENCOUNTER — Telehealth: Payer: Self-pay | Admitting: Interventional Cardiology

## 2014-04-27 NOTE — Telephone Encounter (Signed)
New message      Pt had valve surgery 61yr ago and his platelets are dropping.  He is seeing a hematologist soon.  Can he mow the yard.  He has a self propelled Conservation officer, nature but it is not a riding mower.  Please call

## 2014-04-27 NOTE — Telephone Encounter (Signed)
Spoke with pt and made him aware that Dr. Tamala Julian was not in the office today but that I would forward this information to him for review and advisement. Pt verbalized understanding and was in agreement with this plan. Pt states that he does have some exertional SOB when he does physical therapy but other then that no cardiac issues noted.

## 2014-04-28 NOTE — Telephone Encounter (Signed)
Okay to mow.

## 2014-04-30 NOTE — Telephone Encounter (Signed)
lmom. per Dr.Smith it is ok to mow his lawn

## 2014-06-29 ENCOUNTER — Other Ambulatory Visit: Payer: Self-pay | Admitting: Physician Assistant

## 2014-07-05 ENCOUNTER — Ambulatory Visit (HOSPITAL_BASED_OUTPATIENT_CLINIC_OR_DEPARTMENT_OTHER): Payer: Federal, State, Local not specified - PPO | Admitting: Family

## 2014-07-05 ENCOUNTER — Other Ambulatory Visit (HOSPITAL_BASED_OUTPATIENT_CLINIC_OR_DEPARTMENT_OTHER): Payer: Federal, State, Local not specified - PPO

## 2014-07-05 ENCOUNTER — Encounter: Payer: Self-pay | Admitting: Family

## 2014-07-05 VITALS — BP 139/84 | HR 50 | Temp 97.8°F | Resp 16 | Ht 66.0 in | Wt 174.0 lb

## 2014-07-05 DIAGNOSIS — D696 Thrombocytopenia, unspecified: Secondary | ICD-10-CM

## 2014-07-05 LAB — CBC WITH DIFFERENTIAL (CANCER CENTER ONLY)
BASO#: 0 10*3/uL (ref 0.0–0.2)
BASO%: 0.4 % (ref 0.0–2.0)
EOS%: 2.3 % (ref 0.0–7.0)
Eosinophils Absolute: 0.1 10*3/uL (ref 0.0–0.5)
HCT: 37.4 % — ABNORMAL LOW (ref 38.7–49.9)
HEMOGLOBIN: 13.7 g/dL (ref 13.0–17.1)
LYMPH#: 0.9 10*3/uL (ref 0.9–3.3)
LYMPH%: 18.5 % (ref 14.0–48.0)
MCH: 34.2 pg — AB (ref 28.0–33.4)
MCHC: 36.6 g/dL — ABNORMAL HIGH (ref 32.0–35.9)
MCV: 93 fL (ref 82–98)
MONO#: 0.3 10*3/uL (ref 0.1–0.9)
MONO%: 6.8 % (ref 0.0–13.0)
NEUT%: 72 % (ref 40.0–80.0)
NEUTROS ABS: 3.5 10*3/uL (ref 1.5–6.5)
PLATELETS: 111 10*3/uL — AB (ref 145–400)
RBC: 4.01 10*6/uL — ABNORMAL LOW (ref 4.20–5.70)
RDW: 12 % (ref 11.1–15.7)
WBC: 4.9 10*3/uL (ref 4.0–10.0)

## 2014-07-05 LAB — CHCC SATELLITE - SMEAR

## 2014-07-05 NOTE — Progress Notes (Signed)
Hematology and Oncology Follow Up Visit  Max Byrd [id number] 1955/11/15 59 y.o. 07/05/2014   Principle Diagnosis:  Medication induced thrombocytopenia  Current Therapy:   Observation    Interim History:  Mr. Hawkes is here today for a follow-up. He is doing well. He has finished cardiac rehab and is going to the gym to work out 2-3 times a week. His energy is slowly improving.  He has an appointment with cardiology in July and is hoping to stop the Plavix at that time and wean down some of his BP medication. His platelet count is holding steady at 111.  He SOB is improved but still occurs with some exertion.  He denies fever, chills, n/v, cough, rash, dizziness, blurred vision, headache, chest pain, palpitations, abdominal pain, constipation, diarrhea, blood in urine or stool.  No episodes of bleeding or bruising.  No tenderness, numbness or tingling in his extremities. He has some swelling in his feet and ankles at times. This is relieved with lasix and elevation. No new aches or pains.  His appetite is good and he is staying hydrated. His weight is stable.   Medications:    Medication List       This list is accurate as of: 07/05/14 10:10 AM.  Always use your most recent med list.               ALPRAZolam 1 MG tablet  Commonly known as:  XANAX  Take 1 mg by mouth 4 (four) times daily.     ARTHROTEC 50-0.2 MG Tbec  Generic drug:  Diclofenac-Misoprostol  Take 1 tablet by mouth 2 (two) times daily.     aspirin 81 MG EC tablet  Take 1 tablet (81 mg total) by mouth daily.     carisoprodol 350 MG tablet  Commonly known as:  SOMA  Take 350 mg by mouth 2 (two) times daily.     CLEAR EYES OP  Place 1 drop into both eyes daily as needed (dry eyes).     clindamycin 150 MG capsule  Commonly known as:  CLEOCIN  Take 150 mg by mouth as needed. As needed before dental work     clopidogrel 75 MG tablet  Commonly known as:  PLAVIX  TAKE ONE TABLET BY MOUTH DAILY WITH BREAKFAST      DULoxetine 30 MG capsule  Commonly known as:  CYMBALTA  Take 90 mg by mouth 2 (two) times daily.     escitalopram 10 MG tablet  Commonly known as:  LEXAPRO  Take 10 mg by mouth 2 (two) times daily.     eszopiclone 3 MG Tabs  Generic drug:  Eszopiclone  Take 3 mg by mouth at bedtime. Take immediately before bedtime     furosemide 40 MG tablet  Commonly known as:  LASIX  TAKE ONE TABLET BY MOUTH ONCE DAILY     hyoscyamine 0.125 MG SL tablet  Commonly known as:  LEVSIN SL  Take 0.125 mg by mouth every 4 (four) hours as needed.     irbesartan 150 MG tablet  Commonly known as:  AVAPRO  TAKE ONE TABLET BY MOUTH ONCE DAILY     loperamide 2 MG capsule  Commonly known as:  IMODIUM  Take 2 mg by mouth as needed for diarrhea or loose stools.     metoprolol succinate 50 MG 24 hr tablet  Commonly known as:  TOPROL-XL  TAKE ONE TABLET BY MOUTH DAILY. TAKE WITH OR immediately following A MEAL  nitroGLYCERIN 0.4 MG SL tablet  Commonly known as:  NITROSTAT  Place 0.4 mg under the tongue every 5 (five) minutes as needed for chest pain (MAX 3 TABLETS).     oxyCODONE-acetaminophen 10-325 MG per tablet  Commonly known as:  PERCOCET  Take 1 tablet by mouth every 4 (four) hours as needed for pain.     OXYCONTIN 30 MG T12a  Generic drug:  OxyCODONE HCl ER  Take 30 mg by mouth every 12 (twelve) hours.     pantoprazole 40 MG tablet  Commonly known as:  PROTONIX  Take 1 tablet (40 mg total) by mouth daily.     sildenafil 100 MG tablet  Commonly known as:  VIAGRA  Take 100 mg by mouth.     simethicone 125 MG chewable tablet  Commonly known as:  MYLICON  Chew 170 mg by mouth every 6 (six) hours as needed for flatulence.        Allergies:  Allergies  Allergen Reactions  . Aspirin Other (See Comments)    H/O BLEEDING ULCER  . Buspirone Other (See Comments)    MEMORY LOSS  . Nsaids Other (See Comments)    Stomach bleeding  . Zoloft [Sertraline Hcl] Other (See Comments)     FELT TERRIBLE  . Klonopin [Clonazepam] Other (See Comments)    Erectile dysfunction  . Tolmetin Other (See Comments)    Stomach bleeding  . Reglan [Metoclopramide] Anxiety    EXCITABILITY     Past Medical History, Surgical history, Social history, and Family History were reviewed and updated.  Review of Systems: All other 10 point review of systems is negative.   Physical Exam:  height is 5\' 6"  (1.676 m) and weight is 174 lb (78.926 kg). His oral temperature is 97.8 F (36.6 C). His blood pressure is 139/84 and his pulse is 50. His respiration is 16.   Wt Readings from Last 3 Encounters:  07/05/14 174 lb (78.926 kg)  03/15/14 171 lb (77.565 kg)  03/08/14 168 lb (76.204 kg)    Ocular: Sclerae unicteric, pupils equal, round and reactive to light Ear-nose-throat: Oropharynx clear, dentition fair Lymphatic: No cervical or supraclavicular adenopathy Lungs no rales or rhonchi, good excursion bilaterally Heart regular rate and rhythm, no murmur appreciated Abd soft, nontender, positive bowel sounds MSK no focal spinal tenderness, no joint edema Neuro: non-focal, well-oriented, appropriate affect  Lab Results  Component Value Date   WBC 4.9 07/05/2014   HGB 13.7 07/05/2014   HCT 37.4* 07/05/2014   MCV 93 07/05/2014   PLT 111* 07/05/2014   No results found for: FERRITIN, IRON, TIBC, UIBC, IRONPCTSAT Lab Results  Component Value Date   RBC 4.01* 07/05/2014   No results found for: KPAFRELGTCHN, LAMBDASER, KAPLAMBRATIO No results found for: IGGSERUM, IGA, IGMSERUM No results found for: Odetta Pink, SPEI   Chemistry      Component Value Date/Time   NA 139 03/08/2014 1230   NA 139 11/15/2013 1159   K 4.2 03/08/2014 1230   K 4.6 11/15/2013 1159   CL 101 03/08/2014 1230   CL 107 11/15/2013 1159   CO2 30 03/08/2014 1230   CO2 29 11/15/2013 1159   BUN 27* 03/08/2014 1230   BUN 32* 11/15/2013 1159   CREATININE 1.4*  03/08/2014 1230   CREATININE 1.2 11/15/2013 1159      Component Value Date/Time   CALCIUM 8.8 03/08/2014 1230   CALCIUM 8.8 11/15/2013 1159   ALKPHOS 51 03/08/2014 1230  ALKPHOS 57 02/10/2013 0932   AST 24 03/08/2014 1230   AST 20 02/10/2013 0932   ALT 23 03/08/2014 1230   ALT 18 02/10/2013 0932   BILITOT 1.00 03/08/2014 1230   BILITOT 0.5 02/10/2013 0932     Impression and Plan: Mr. Fjelstad is a very pleasant 59 yo male with a significant cardiac history and thrombocytopenia secondary to medications Plavix and aspirin. He has completed his 3rd cardiac rehab. He is feeling better and is asymptomatic at this time.  His SOB and fatigue are improving.  His platelets count is holding steady at 111. He has had no bruising or episodes of bleeding.  We will see him back in 6 months for labs and follow-up.  He knows to contact us with any problems, questions or concerns. We can certainly see him much sooner if necessary.  Eliezer Bottom, NP 5/19/201610:10 AM

## 2014-07-30 ENCOUNTER — Other Ambulatory Visit: Payer: Self-pay | Admitting: Interventional Cardiology

## 2014-08-27 ENCOUNTER — Other Ambulatory Visit: Payer: Self-pay | Admitting: Interventional Cardiology

## 2014-08-27 DIAGNOSIS — Z952 Presence of prosthetic heart valve: Secondary | ICD-10-CM

## 2014-08-27 NOTE — Progress Notes (Signed)
Cardiology Office Note   Date:  08/28/2014   ID:  Max Byrd, DOB 1955-05-01, MRN [id number]  PCP:  Wenda Low, MD  Cardiologist:  Sinclair Grooms, MD   Chief Complaint  Patient presents with  . Coronary Artery Disease      History of Present Illness: Max Byrd is a 59 y.o. male who presents for chronic kidney disease, bicuspid aortic valve treated with pericardial tissue valve, hypertension, CAD with angina, circumflex stent after failed saphenous vein graft, and depression.  Patient's main complaint is exertional fatigue and loss of interest and desire to do things that were once desirable. He doesn't fish or hunt anymore. He snores at night. He feels tired all the time. He feels there is no quality of life.    Past Medical History  Diagnosis Date  . Erectile dysfunction     INJECTIONS OF PROSTAGLANDIN BY UROLGIST  . CKD (chronic kidney disease) stage 2, GFR 60-89 ml/min   . Renal cell carcinoma of right kidney 2003  . H/O Bell's palsy 2010 X2  . Depression   . Anxiety   . Pain, chronic postoperative     S/P LEFT ARM SURGERY  . IBS (irritable bowel syndrome)   . Bicuspid aortic valve     a. 01/2013 s/p AVR - 49mm Edwards 3300 TFX pericardial tissue valve, ser # [id number].  Marland Kitchen Essential hypertension, benign   . Hyperlipidemia   . Coronary atherosclerosis of native coronary artery     a. 01/2013 CABGx3: LIMA->LAD, VG->Diag, VG->OM (performed @ time of AVR),  occluded SVG-OM2 , now with 95% stenosis at the anastomosis site on OM 2 - PCI attempt 07/28/13 unsuccessful - med Rx cont'd   . Heart murmur   . Anginal pain 2015    "since valve was done"  . History of blood transfusion     "related to stomach bleeding from ASA & NSAIDS  . History of stomach ulcers   . Migraine     "went away in the 1990's"  . Arthritis     "left pointer finger; forminal openings bilaterally" (08/10/2013)    Past Surgical History  Procedure Laterality Date  . Colonoscopy  2008   DIVERTICULOSIS  . Anal sphincterotomy  04/13/03    DR. GRAPEY  . Partial nephrectomy  08/24/02    RIGHT...DR. Risa Grill  . Lateral epicondyle release Left 07/29/07    DR. GRAVES  . Tonsillectomy    . Aortic valve replacement N/A 02/14/2013    Procedure: AORTIC VALVE REPLACEMENT (AVR);  Surgeon: Ivin Poot, MD;  Location: [place];  Service: Open Heart Surgery;  Laterality: N/A;  . Coronary artery bypass graft N/A 02/14/2013    Procedure: CORONARY ARTERY BYPASS GRAFTING (CABG);  Surgeon: Ivin Poot, MD;  Location: [place];  Service: Open Heart Surgery;  Laterality: N/A;  . Intraoperative transesophageal echocardiogram N/A 02/14/2013    Procedure: INTRAOPERATIVE TRANSESOPHAGEAL ECHOCARDIOGRAM;  Surgeon: Ivin Poot, MD;  Location: [place];  Service: Open Heart Surgery;  Laterality: N/A;  . Cardiac catheterization  2010; 6/12015; 07/29/2013  . Coronary angioplasty with stent placement  08/10/2013    "1"  . Excisional hemorrhoidectomy    . Hernia repair Left 1957  . Elbow surgery Left X 2  . Elbow surgery Right X 1  . Toe surgery Right     "shaved; wired back straight"  . Cardiac valve replacement    . Left and right heart catheterization with coronary angiogram N/A 01/27/2013  Procedure: LEFT AND RIGHT HEART CATHETERIZATION WITH CORONARY ANGIOGRAM;  Surgeon: Sinclair Grooms, MD;  Location: [facility] CATH LAB;  Service: Cardiovascular;  Laterality: N/A;  . Left heart catheterization with coronary/graft angiogram N/A 07/28/2013    Procedure: LEFT HEART CATHETERIZATION WITH Beatrix Fetters;  Surgeon: Leonie Man, MD;  Location: [facility] CATH LAB;  Service: Cardiovascular;  Laterality: N/A;  . Percutaneous stent intervention N/A 08/10/2013    Procedure: PERCUTANEOUS STENT INTERVENTION;  Surgeon: Sinclair Grooms, MD;  Location: [facility] CATH LAB;  Service: Cardiovascular;  Laterality: N/A;     Current Outpatient Prescriptions  Medication Sig Dispense Refill  . ALPRAZolam (XANAX) 1 MG tablet  Take 1 mg by mouth 4 (four) times daily.    Marland Kitchen aspirin EC 81 MG EC tablet Take 1 tablet (81 mg total) by mouth daily.    . carisoprodol (SOMA) 350 MG tablet Take 350 mg by mouth 2 (two) times daily.    . clindamycin (CLEOCIN) 150 MG capsule Take 150 mg by mouth as needed. As needed before dental work    . Diclofenac-Misoprostol (ARTHROTEC) 50-0.2 MG TBEC Take 1 tablet by mouth 2 (two) times daily.    . DULoxetine (CYMBALTA) 30 MG capsule Take 30 mg by mouth at bedtime.    . DULoxetine (CYMBALTA) 60 MG capsule Take 60 mg by mouth every morning.    . escitalopram (LEXAPRO) 10 MG tablet Take 10 mg by mouth 2 (two) times daily.    . Eszopiclone (ESZOPICLONE) 3 MG TABS Take 3 mg by mouth at bedtime. Take immediately before bedtime    . furosemide (LASIX) 40 MG tablet TAKE ONE TABLET BY MOUTH ONCE DAILY 30 tablet 10  . hyoscyamine (LEVSIN SL) 0.125 MG SL tablet Take 0.125 mg by mouth every 4 (four) hours as needed (IBS).     Marland Kitchen irbesartan (AVAPRO) 150 MG tablet TAKE ONE TABLET BY MOUTH ONCE DAILY 30 tablet 4  . loperamide (IMODIUM) 2 MG capsule Take 2 mg by mouth as needed for diarrhea or loose stools.    . metoprolol succinate (TOPROL-XL) 25 MG 24 hr tablet Take 1 tablet (25 mg total) by mouth daily. /Take with or immediately following a meal. STOP ON 09/11/14    . Naphazoline HCl (CLEAR EYES OP) Place 1 drop into both eyes daily as needed (dry eyes).    . nitroGLYCERIN (NITROSTAT) 0.4 MG SL tablet Place 0.4 mg under the tongue every 5 (five) minutes as needed for chest pain (MAX 3 TABLETS).    Marland Kitchen oxyCODONE-acetaminophen (PERCOCET) 10-325 MG per tablet Take 1 tablet by mouth every 4 (four) hours as needed for pain.    . OXYCONTIN 30 MG T12A Take 30 mg by mouth every 12 (twelve) hours.     . pantoprazole (PROTONIX) 40 MG tablet Take 1 tablet (40 mg total) by mouth daily. 30 tablet 11  . sildenafil (VIAGRA) 100 MG tablet Take 100 mg by mouth as needed for erectile dysfunction.     . simethicone (MYLICON)  580 MG chewable tablet Chew 125 mg by mouth every 6 (six) hours as needed for flatulence.     No current facility-administered medications for this visit.    Allergies:   Aspirin; Buspirone; Nsaids; Tolmetin; Zoloft; Klonopin; and Reglan    Social History:  The patient  reports that he quit smoking about 38 years ago. His smoking use included Cigarettes. He started smoking about 44 years ago. He has a 9 pack-year smoking history. His smokeless tobacco use includes  Snuff. He reports that he drinks alcohol. He reports that he does not use illicit drugs.   Family History:  The patient's family history includes Cancer in his mother; Heart disease in his father.    ROS:  Please see the history of present illness.   Otherwise, review of systems are positive for anxiety, depression, diarrhea, leg swelling..   All other systems are reviewed and negative.    PHYSICAL EXAM: VS:  BP 122/80 mmHg  Pulse 50  Ht 5\' 6"  (1.676 m)  Wt 76.023 kg (167 lb 9.6 oz)  BMI 27.06 kg/m2  SpO2 75% , BMI Body mass index is 27.06 kg/(m^2). GEN: Well nourished, well developed, in no acute distress HEENT: normal Neck: no JVD, carotid bruits, or masses Cardiac: RRR; no murmurs, rubs, or gallops,no edema  Respiratory:  clear to auscultation bilaterally, normal work of breathing GI: soft, nontender, nondistended, + BS MS: no deformity or atrophy Skin: warm and dry, no rash Neuro:  Strength and sensation are intact Psych: euthymic mood, full affect   EKG:  EKG is not ordered today.   Recent Labs: 03/08/2014: ALT(SGPT) 23; BUN, Bld 27*; Creat 1.4*; Potassium 4.2; Sodium 139 07/05/2014: HGB 13.7; Platelets 111*    Lipid Panel No results found for: CHOL, TRIG, HDL, CHOLHDL, VLDL, LDLCALC, LDLDIRECT    Wt Readings from Last 3 Encounters:  08/28/14 76.023 kg (167 lb 9.6 oz)  07/05/14 78.926 kg (174 lb)  03/15/14 77.565 kg (171 lb)      Other studies Reviewed: Additional studies/ records that were reviewed  today include: . Review of the above records demonstrates:    ASSESSMENT AND PLAN:  1. S/P AVR (aortic valve replacement) Clinically normal tissue valve function  2. Essential hypertension Controlled  3. Hyperlipidemia No recent data  4. Coronary artery disease involving coronary bypass graft of native heart without angina pectoris Denies angina  5. Chronic diastolic heart failure No evidence of volume overload  6. Loss of desire, rule out depression     Current medicines are reviewed at length with the patient today.  The patient has concerns regarding medicines.  The following changes have been made:  Discontinue Plavix, wean and discontinue metoprolol. Measure blood pressure during the second week after metoprolol is been discontinued. Notify us if any cardiovascular symptoms off metoprolol.  Labs/ tests ordered today include:  No orders of the defined types were placed in this encounter.     Disposition:   FU with HS in 4 months  Signed, Sinclair Grooms, MD  08/28/2014 3:35 PM    [facility] [address] [place], [place], [place]  [address] Phone: [phone]; Fax: [phone]

## 2014-08-28 ENCOUNTER — Ambulatory Visit (HOSPITAL_COMMUNITY): Payer: Federal, State, Local not specified - PPO | Attending: Cardiology

## 2014-08-28 ENCOUNTER — Encounter: Payer: Self-pay | Admitting: Interventional Cardiology

## 2014-08-28 ENCOUNTER — Other Ambulatory Visit: Payer: Self-pay

## 2014-08-28 ENCOUNTER — Ambulatory Visit (INDEPENDENT_AMBULATORY_CARE_PROVIDER_SITE_OTHER): Payer: Federal, State, Local not specified - PPO | Admitting: Interventional Cardiology

## 2014-08-28 ENCOUNTER — Other Ambulatory Visit: Payer: Federal, State, Local not specified - PPO

## 2014-08-28 VITALS — BP 122/80 | HR 50 | Ht 66.0 in | Wt 167.6 lb

## 2014-08-28 DIAGNOSIS — I10 Essential (primary) hypertension: Secondary | ICD-10-CM | POA: Diagnosis not present

## 2014-08-28 DIAGNOSIS — I25810 Atherosclerosis of coronary artery bypass graft(s) without angina pectoris: Secondary | ICD-10-CM | POA: Diagnosis not present

## 2014-08-28 DIAGNOSIS — Z954 Presence of other heart-valve replacement: Secondary | ICD-10-CM

## 2014-08-28 DIAGNOSIS — I059 Rheumatic mitral valve disease, unspecified: Secondary | ICD-10-CM | POA: Diagnosis not present

## 2014-08-28 DIAGNOSIS — E785 Hyperlipidemia, unspecified: Secondary | ICD-10-CM | POA: Diagnosis not present

## 2014-08-28 DIAGNOSIS — Z952 Presence of prosthetic heart valve: Secondary | ICD-10-CM

## 2014-08-28 DIAGNOSIS — I5032 Chronic diastolic (congestive) heart failure: Secondary | ICD-10-CM

## 2014-08-28 MED ORDER — METOPROLOL SUCCINATE ER 25 MG PO TB24: 25.0000 mg | ORAL_TABLET | Freq: Every day | ORAL | Status: DC

## 2014-08-28 NOTE — Patient Instructions (Signed)
Medication Instructions:  Your physician has recommended you make the following change in your medication:  1)STOP Plavix 2)REDUCE Metoprolol to 25mg  daily for 2 weeks then STOP  Labwork: None   Testing/Procedures: None   Follow-Up: Your physician wants you to follow-up in: 4-6 months with Dr.Smith You will receive a reminder letter in the mail two months in advance. If you don't receive a letter, please call our office to schedule the follow-up appointment.   Any Other Special Instructions Will Be Listed Below (If Applicable). 1 week after stopping Metoprolol measure your blood pressure daily for 1 week. Call the office with your readings

## 2014-08-30 ENCOUNTER — Telehealth: Payer: Self-pay

## 2014-08-30 NOTE — Telephone Encounter (Signed)
Called to give pt echo results.lmtcb 

## 2014-08-30 NOTE — Telephone Encounter (Signed)
-----   Message from Belva Crome, MD sent at 08/28/2014  6:40 PM EDT ----- Normal  Heart pumping function. The prosthetic valve is structurally normal. There is relative restriction of outflow by the prosthetic valve. Overall no worries.

## 2014-08-30 NOTE — Telephone Encounter (Signed)
Follow up ° ° ° ° ° °Returning Lisa's call °

## 2014-08-30 NOTE — Telephone Encounter (Signed)
Pt aware of echo results  Normal Heart pumping function. The prosthetic valve is structurally normal. There is relative restriction of outflow by the prosthetic valve. Overall no worries. Pt verbalized understanding.

## 2014-09-11 ENCOUNTER — Other Ambulatory Visit (HOSPITAL_COMMUNITY): Payer: Federal, State, Local not specified - PPO

## 2014-09-19 ENCOUNTER — Telehealth: Payer: Self-pay | Admitting: Interventional Cardiology

## 2014-09-19 NOTE — Telephone Encounter (Signed)
New message      Pt calling with b/p reading for this week: Monday: 144/77; Tuesday 141/87 and Wednesday 131/73 Pt will call back on Friday with Thursday and Friday's reading Please call to discuss

## 2014-09-19 NOTE — Telephone Encounter (Signed)
Returned pt call. Lmom. Will fwd bp reading to [place] for review. Pt should call back with additional readings as planned

## 2014-09-20 MED ORDER — AMLODIPINE BESYLATE 5 MG PO TABS: 5.0000 mg | ORAL_TABLET | Freq: Every day | ORAL | Status: DC

## 2014-09-20 NOTE — Telephone Encounter (Signed)
F/U  Pt returning [facility] phone call-- PLEASE use Home number [phone]

## 2014-09-20 NOTE — Telephone Encounter (Signed)
Called to give pt Dr.Smiths recommendation. lmtcb 

## 2014-09-20 NOTE — Telephone Encounter (Signed)
Start amlodipine 5 mg daily for BP

## 2014-09-20 NOTE — Telephone Encounter (Signed)
Pt aware of Dr.Smith's recommendation. Start amlodipine 5 mg daily for BP. Rx sent to pt pharmacy  Adv pt to ck his bp 2-3 a week for the next 2 weeks and call with his BP readings. Pt agreeable with plan and verbalized understanding.

## 2014-10-01 ENCOUNTER — Other Ambulatory Visit: Payer: Self-pay | Admitting: Interventional Cardiology

## 2014-10-15 ENCOUNTER — Telehealth: Payer: Self-pay | Admitting: Interventional Cardiology

## 2014-10-15 NOTE — Telephone Encounter (Signed)
Spoke with pt and he states he was told to monitor his BP 2-3 times a week for 2 weeks due to switching from Metoprolol to Norvasc.   Pt only had the three values available- 8-22 117/75; 8-25 122/82; 8-29 137/77. Pt states that he is feeling much better on the Norvasc and has no stomach issues like he did with Metoprolol. Pt denies having any side effects at this time with new medication. Will forward to Dr. Tamala Julian and his CMA Lattie Haw for review, advisement and follow up.

## 2014-10-15 NOTE — Telephone Encounter (Signed)
New message     Pt c/o BP issue: STAT if pt c/o blurred vision, one-sided weakness or slurred speech  1. What are your last 5 BP readings? 8-22 117/75; 8-25 122/82; 8-29 137/77  2. Are you having any other symptoms (ex. Dizziness, headache, blurred vision, passed out)?  no 3. What is your BP issue?  Pt stopped toprol and is on norvasc.  Calling to give bp readings

## 2014-10-17 NOTE — Telephone Encounter (Signed)
Great. I will add beta blocker as a medication intolerance.

## 2014-10-18 NOTE — Addendum Note (Signed)
Addended by: Lamar Laundry on: 10/18/2014 08:13 AM   Modules accepted: Orders, Medications

## 2014-10-18 NOTE — Telephone Encounter (Signed)
Pt med list updated

## 2014-10-24 ENCOUNTER — Other Ambulatory Visit: Payer: Self-pay | Admitting: Physician Assistant

## 2014-10-26 ENCOUNTER — Telehealth: Payer: Self-pay | Admitting: Interventional Cardiology

## 2014-10-26 NOTE — Telephone Encounter (Signed)
nw message      Pt c/o BP issue: STAT if pt c/o blurred vision, one-sided weakness or slurred speech  1. What are your last 5 BP readings? 9-6 121/81, 9-9 100/70 2. Are you having any other symptoms (ex. Dizziness, headache, blurred vision, passed out)? no 3. What is your BP issue?  Calling to give bp readings

## 2014-10-26 NOTE — Telephone Encounter (Signed)
Returned pt call. Pt sts that he is doing well, and is tolerating Amlodipine ok. Adv pt to continue to ck his bp regularly. Pt to call the office if he has consistently low or high bp readings, or If symptoms develop Adv pt that his bp readings are noted. I will fwd them to Dr.Smith to review Pt verbalized understanding.

## 2014-11-21 ENCOUNTER — Other Ambulatory Visit: Payer: Self-pay | Admitting: Physician Assistant

## 2014-11-21 NOTE — Telephone Encounter (Signed)
I dont see where Dr Tamala Julian has ever refilled this. Ok to refill? Please advise. Thanks, MI

## 2015-01-02 ENCOUNTER — Other Ambulatory Visit (HOSPITAL_BASED_OUTPATIENT_CLINIC_OR_DEPARTMENT_OTHER): Payer: Federal, State, Local not specified - PPO

## 2015-01-02 ENCOUNTER — Encounter: Payer: Self-pay | Admitting: Hematology & Oncology

## 2015-01-02 ENCOUNTER — Ambulatory Visit (HOSPITAL_BASED_OUTPATIENT_CLINIC_OR_DEPARTMENT_OTHER): Payer: Federal, State, Local not specified - PPO | Admitting: Hematology & Oncology

## 2015-01-02 VITALS — BP 130/82 | HR 82 | Temp 94.0°F | Wt 170.0 lb

## 2015-01-02 DIAGNOSIS — D6959 Other secondary thrombocytopenia: Secondary | ICD-10-CM

## 2015-01-02 DIAGNOSIS — D696 Thrombocytopenia, unspecified: Secondary | ICD-10-CM

## 2015-01-02 LAB — CBC WITH DIFFERENTIAL (CANCER CENTER ONLY)
BASO#: 0 10*3/uL (ref 0.0–0.2)
BASO%: 0.2 % (ref 0.0–2.0)
EOS%: 2.2 % (ref 0.0–7.0)
Eosinophils Absolute: 0.1 10*3/uL (ref 0.0–0.5)
HEMATOCRIT: 40 % (ref 38.7–49.9)
HGB: 14.5 g/dL (ref 13.0–17.1)
LYMPH#: 1.1 10*3/uL (ref 0.9–3.3)
LYMPH%: 19.4 % (ref 14.0–48.0)
MCH: 33.2 pg (ref 28.0–33.4)
MCHC: 36.3 g/dL — AB (ref 32.0–35.9)
MCV: 92 fL (ref 82–98)
MONO#: 0.4 10*3/uL (ref 0.1–0.9)
MONO%: 6.4 % (ref 0.0–13.0)
NEUT#: 3.9 10*3/uL (ref 1.5–6.5)
NEUT%: 71.8 % (ref 40.0–80.0)
PLATELETS: 125 10*3/uL — AB (ref 145–400)
RBC: 4.37 10*6/uL (ref 4.20–5.70)
RDW: 12.3 % (ref 11.1–15.7)
WBC: 5.5 10*3/uL (ref 4.0–10.0)

## 2015-01-02 LAB — CHCC SATELLITE - SMEAR

## 2015-01-02 NOTE — Progress Notes (Signed)
Hematology and Oncology Follow Up Visit  Max Byrd [id number] April 26, 1955 59 y.o. 01/02/2015   Principle Diagnosis:   Thrombocytopenia-medication induced  Current Therapy:    Observation     Interim History:  Max Byrd is back for follow-up. We seem every 6 months. He says that his family doctor wants him to have his labs checked every 2 months. We can sorely do this without any problems.  He's had no problems over the summertime.  He's had no issues with bleeding or bruising.  He's been playing the guitar. He enjoys doing this.  He has had some issues with some dizziness. This is been chronic.  He says that he has some leg swelling. This might be coming from the amlodipine that he is taking.  He's had no problem with fevers. He's had no change in bowel or bladder habits.  Overall, his performance status is ECOG 0.  Medications:  Current outpatient prescriptions:  .  ALPRAZolam (XANAX) 1 MG tablet, Take 1 mg by mouth 4 (four) times daily., Disp: , Rfl:  .  amLODipine (NORVASC) 5 MG tablet, Take 1 tablet (5 mg total) by mouth daily., Disp: 30 tablet, Rfl: 11 .  aspirin EC 81 MG EC tablet, Take 1 tablet (81 mg total) by mouth daily., Disp: , Rfl:  .  carisoprodol (SOMA) 350 MG tablet, Take 350 mg by mouth 2 (two) times daily., Disp: , Rfl:  .  clindamycin (CLEOCIN) 150 MG capsule, Take 150 mg by mouth as needed. As needed before dental work, Disp: , Rfl:  .  Diclofenac-Misoprostol (ARTHROTEC) 50-0.2 MG TBEC, Take 1 tablet by mouth 2 (two) times daily., Disp: , Rfl:  .  DULoxetine (CYMBALTA) 30 MG capsule, Take 30 mg by mouth at bedtime., Disp: , Rfl:  .  DULoxetine (CYMBALTA) 60 MG capsule, Take 60 mg by mouth every morning., Disp: , Rfl:  .  Eszopiclone (ESZOPICLONE) 3 MG TABS, Take 3 mg by mouth at bedtime. Take immediately before bedtime, Disp: , Rfl:  .  furosemide (LASIX) 40 MG tablet, TAKE ONE TABLET BY MOUTH ONCE DAILY, Disp: 30 tablet, Rfl: 10 .  hyoscyamine (LEVSIN  SL) 0.125 MG SL tablet, Take 0.125 mg by mouth every 4 (four) hours as needed (IBS). , Disp: , Rfl:  .  irbesartan (AVAPRO) 150 MG tablet, TAKE ONE TABLET BY MOUTH ONCE DAILY, Disp: 30 tablet, Rfl: 6 .  loperamide (IMODIUM) 2 MG capsule, Take 2 mg by mouth as needed for diarrhea or loose stools., Disp: , Rfl:  .  nitroGLYCERIN (NITROSTAT) 0.4 MG SL tablet, Place 0.4 mg under the tongue every 5 (five) minutes as needed for chest pain (MAX 3 TABLETS)., Disp: , Rfl:  .  oxyCODONE-acetaminophen (PERCOCET) 10-325 MG per tablet, Take 1 tablet by mouth every 4 (four) hours as needed for pain., Disp: , Rfl:  .  OXYCONTIN 30 MG T12A, Take 30 mg by mouth every 12 (twelve) hours. , Disp: , Rfl:  .  pantoprazole (PROTONIX) 40 MG tablet, TAKE ONE TABLET BY MOUTH ONCE DAILY, Disp: 30 tablet, Rfl: 11 .  sertraline (ZOLOFT) 100 MG tablet, Take 100 mg by mouth every morning., Disp: , Rfl:  .  sildenafil (VIAGRA) 100 MG tablet, Take 100 mg by mouth as needed for erectile dysfunction. , Disp: , Rfl:  .  simethicone (MYLICON) 0000000 MG chewable tablet, Chew 125 mg by mouth every 6 (six) hours as needed for flatulence., Disp: , Rfl:   Allergies:  Allergies  Allergen Reactions  .  Aspirin Other (See Comments)    H/O BLEEDING ULCER  . Buspirone Other (See Comments)    MEMORY LOSS  . Nsaids Other (See Comments)    Stomach bleeding  . Tolmetin Other (See Comments)    Stomach bleeding  . Zoloft [Sertraline Hcl] Other (See Comments)    FELT TERRIBLE  . Klonopin [Clonazepam] Other (See Comments)    Erectile dysfunction  . Beta Adrenergic Blockers Nausea Only    Metoprolol  . Reglan [Metoclopramide] Anxiety    EXCITABILITY     Past Medical History, Surgical history, Social history, and Family History were reviewed and updated.  Review of Systems: As above  Physical Exam:  weight is 170 lb (77.111 kg). His oral temperature is 94 F (34.4 C). His blood pressure is 130/82 and his pulse is 82.   Wt Readings  from Last 3 Encounters:  01/02/15 170 lb (77.111 kg)  08/28/14 167 lb 9.6 oz (76.023 kg)  07/05/14 174 lb (78.926 kg)     Well-developed and well-nourished white male in no obvious distress. Head and neck exam shows no ocular or oral lesions. There are no palpable cervical or supraclavicular lymph nodes. Lungs are clear bilaterally. Cardiac exam shows a regular rate and rhythm with no murmurs, rubs or bruits. Abdomen is soft. She he has good bowel sounds. There is no fluid wave. There is no palpable liver or spleen tip. Back exam shows no tenderness over the spine, ribs or hips. Extremities shows no clubbing, cyanosis or edema. Skin exam shows no rashes, ecchymoses or petechia.  Lab Results  Component Value Date   WBC 5.5 01/02/2015   HGB 14.5 01/02/2015   HCT 40.0 01/02/2015   MCV 92 01/02/2015   PLT 125* 01/02/2015     Chemistry      Component Value Date/Time   NA 139 03/08/2014 1230   NA 139 11/15/2013 1159   K 4.2 03/08/2014 1230   K 4.6 11/15/2013 1159   CL 101 03/08/2014 1230   CL 107 11/15/2013 1159   CO2 30 03/08/2014 1230   CO2 29 11/15/2013 1159   BUN 27* 03/08/2014 1230   BUN 32* 11/15/2013 1159   CREATININE 1.4* 03/08/2014 1230   CREATININE 1.2 11/15/2013 1159      Component Value Date/Time   CALCIUM 8.8 03/08/2014 1230   CALCIUM 8.8 11/15/2013 1159   ALKPHOS 51 03/08/2014 1230   ALKPHOS 57 02/10/2013 0932   AST 24 03/08/2014 1230   AST 20 02/10/2013 0932   ALT 23 03/08/2014 1230   ALT 18 02/10/2013 0932   BILITOT 1.00 03/08/2014 1230   BILITOT 0.5 02/10/2013 0932         Impression and Plan: Max Byrd is 59 year old white male. He has from cytopenia. His blood counts have been on the low side.  I looked at his blood under the microscope. I do not see anything unusual with his platelets. They were well granulated. He has good red cell and white cell morphology.  We will go ahead and plan to have him come back every 2 months just for lab work. Her we  will plan to still see him back in 6 months.   Volanda Napoleon, MD 11/16/20169:28 AM

## 2015-02-12 ENCOUNTER — Other Ambulatory Visit: Payer: Self-pay | Admitting: Interventional Cardiology

## 2015-02-12 ENCOUNTER — Other Ambulatory Visit: Payer: Self-pay

## 2015-02-12 MED ORDER — NITROGLYCERIN 0.4 MG SL SUBL: 0.4000 mg | SUBLINGUAL_TABLET | SUBLINGUAL | Status: DC | PRN

## 2015-02-20 ENCOUNTER — Other Ambulatory Visit: Payer: Self-pay | Admitting: *Deleted

## 2015-02-20 MED ORDER — NITROGLYCERIN 0.4 MG SL SUBL: 0.4000 mg | SUBLINGUAL_TABLET | SUBLINGUAL | Status: DC | PRN

## 2015-03-06 ENCOUNTER — Other Ambulatory Visit (HOSPITAL_BASED_OUTPATIENT_CLINIC_OR_DEPARTMENT_OTHER): Payer: Federal, State, Local not specified - PPO

## 2015-03-06 DIAGNOSIS — D696 Thrombocytopenia, unspecified: Secondary | ICD-10-CM

## 2015-03-06 DIAGNOSIS — D6959 Other secondary thrombocytopenia: Secondary | ICD-10-CM

## 2015-03-06 LAB — CBC WITH DIFFERENTIAL (CANCER CENTER ONLY)
BASO#: 0 10*3/uL (ref 0.0–0.2)
BASO%: 0.2 % (ref 0.0–2.0)
EOS%: 2.4 % (ref 0.0–7.0)
Eosinophils Absolute: 0.1 10*3/uL (ref 0.0–0.5)
HEMATOCRIT: 38.1 % — AB (ref 38.7–49.9)
HEMOGLOBIN: 13.5 g/dL (ref 13.0–17.1)
LYMPH#: 0.9 10*3/uL (ref 0.9–3.3)
LYMPH%: 20 % (ref 14.0–48.0)
MCH: 32.7 pg (ref 28.0–33.4)
MCHC: 35.4 g/dL (ref 32.0–35.9)
MCV: 92 fL (ref 82–98)
MONO#: 0.3 10*3/uL (ref 0.1–0.9)
MONO%: 7.3 % (ref 0.0–13.0)
NEUT#: 3.3 10*3/uL (ref 1.5–6.5)
NEUT%: 70.1 % (ref 40.0–80.0)
Platelets: 117 10*3/uL — ABNORMAL LOW (ref 145–400)
RBC: 4.13 10*6/uL — ABNORMAL LOW (ref 4.20–5.70)
RDW: 12.2 % (ref 11.1–15.7)
WBC: 4.7 10*3/uL (ref 4.0–10.0)

## 2015-04-02 ENCOUNTER — Other Ambulatory Visit: Payer: Self-pay | Admitting: Interventional Cardiology

## 2015-04-14 IMAGING — CR DG CHEST 2V
2 series · 2 of 2 positions shown · non-contrast
Comparison: 10/08/2006

CLINICAL DATA: Bicuspid aortic valve, preoperative evaluation for
cardiac catheterization, peripheral edema, hypertension, coronary
artery disease

EXAM:
CHEST  2 VIEW

[view not recorded (1 of 2)]
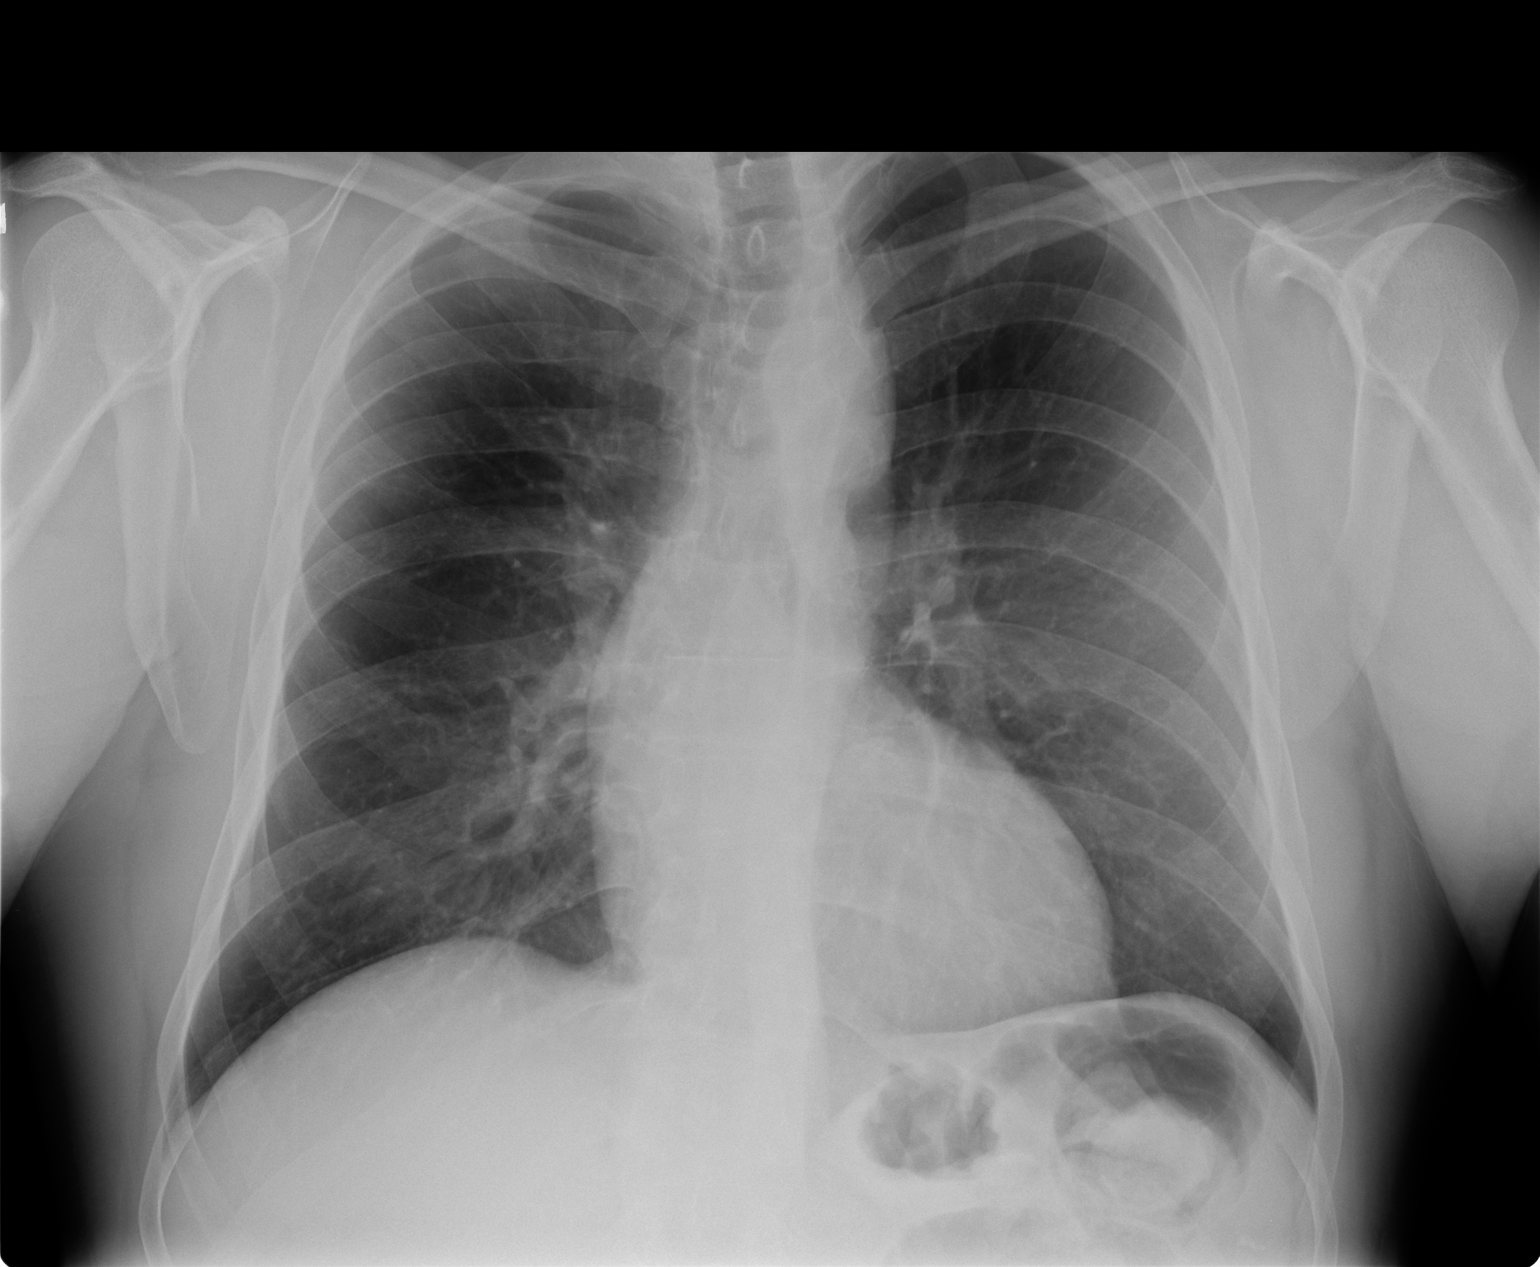

[view not recorded (2 of 2)]
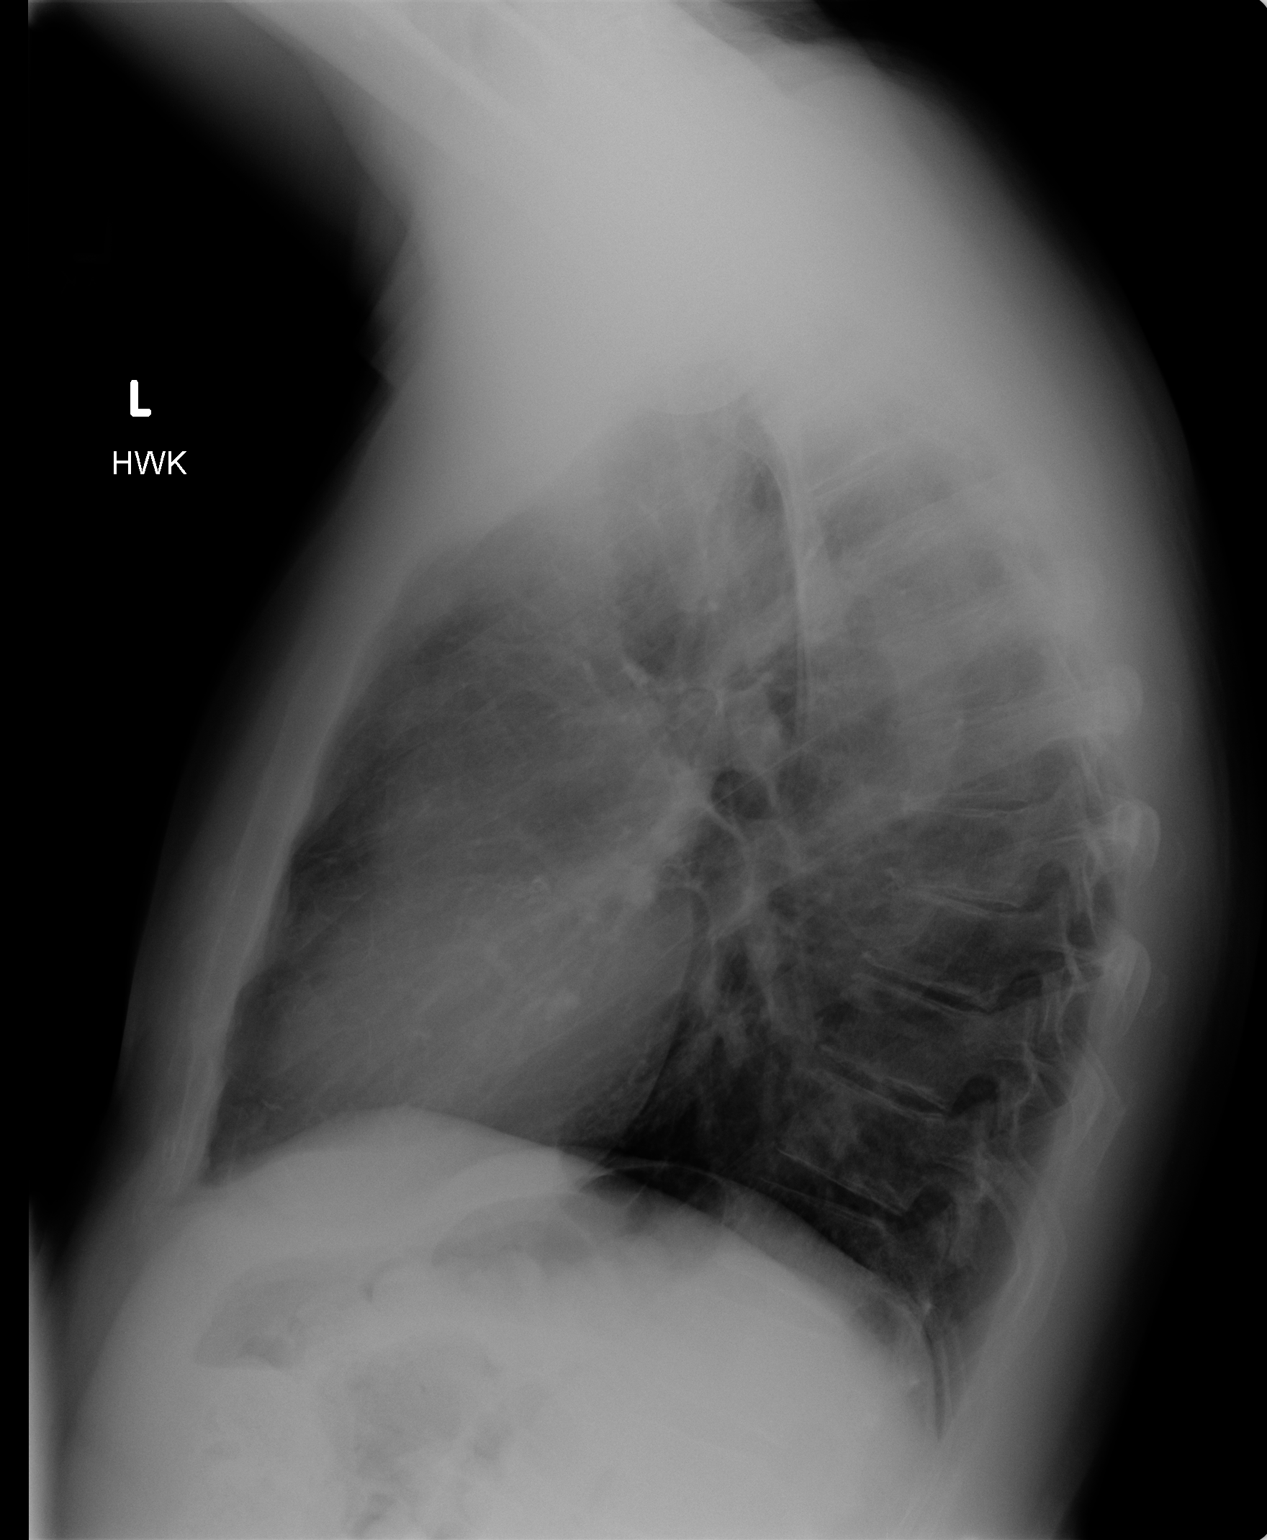

[2 of 2 positions shown; findings below may reference images not displayed]

FINDINGS: Normal heart size, mediastinal contours, and pulmonary vascularity.

Lungs clear.

No pleural effusion or pneumothorax.

No acute osseous findings.
IMPRESSION: No acute abnormalities.

## 2015-05-02 ENCOUNTER — Other Ambulatory Visit (HOSPITAL_BASED_OUTPATIENT_CLINIC_OR_DEPARTMENT_OTHER): Payer: Federal, State, Local not specified - PPO

## 2015-05-02 DIAGNOSIS — D6959 Other secondary thrombocytopenia: Secondary | ICD-10-CM | POA: Diagnosis not present

## 2015-05-02 DIAGNOSIS — D696 Thrombocytopenia, unspecified: Secondary | ICD-10-CM

## 2015-05-02 LAB — CBC WITH DIFFERENTIAL (CANCER CENTER ONLY)
BASO#: 0 10*3/uL (ref 0.0–0.2)
BASO%: 0.2 % (ref 0.0–2.0)
EOS ABS: 0.1 10*3/uL (ref 0.0–0.5)
EOS%: 1.7 % (ref 0.0–7.0)
HCT: 38.7 % (ref 38.7–49.9)
HEMOGLOBIN: 14 g/dL (ref 13.0–17.1)
LYMPH#: 1.2 10*3/uL (ref 0.9–3.3)
LYMPH%: 17.5 % (ref 14.0–48.0)
MCH: 33 pg (ref 28.0–33.4)
MCHC: 36.2 g/dL — ABNORMAL HIGH (ref 32.0–35.9)
MCV: 91 fL (ref 82–98)
MONO#: 0.4 10*3/uL (ref 0.1–0.9)
MONO%: 6.2 % (ref 0.0–13.0)
NEUT%: 74.4 % (ref 40.0–80.0)
NEUTROS ABS: 4.9 10*3/uL (ref 1.5–6.5)
Platelets: 133 10*3/uL — ABNORMAL LOW (ref 145–400)
RBC: 4.24 10*6/uL (ref 4.20–5.70)
RDW: 12.4 % (ref 11.1–15.7)
WBC: 6.6 10*3/uL (ref 4.0–10.0)

## 2015-07-03 ENCOUNTER — Other Ambulatory Visit (HOSPITAL_BASED_OUTPATIENT_CLINIC_OR_DEPARTMENT_OTHER): Payer: Federal, State, Local not specified - PPO

## 2015-07-03 ENCOUNTER — Ambulatory Visit (HOSPITAL_BASED_OUTPATIENT_CLINIC_OR_DEPARTMENT_OTHER): Payer: Federal, State, Local not specified - PPO | Admitting: Hematology & Oncology

## 2015-07-03 VITALS — BP 137/75 | HR 74 | Temp 97.7°F | Resp 16 | Ht 66.0 in | Wt 180.0 lb

## 2015-07-03 DIAGNOSIS — D6959 Other secondary thrombocytopenia: Secondary | ICD-10-CM | POA: Diagnosis not present

## 2015-07-03 DIAGNOSIS — D696 Thrombocytopenia, unspecified: Secondary | ICD-10-CM

## 2015-07-03 LAB — CBC WITH DIFFERENTIAL (CANCER CENTER ONLY)
BASO#: 0 10*3/uL (ref 0.0–0.2)
BASO%: 0.2 % (ref 0.0–2.0)
EOS ABS: 0.1 10*3/uL (ref 0.0–0.5)
EOS%: 1.8 % (ref 0.0–7.0)
HEMATOCRIT: 38.3 % — AB (ref 38.7–49.9)
HGB: 13.7 g/dL (ref 13.0–17.1)
LYMPH#: 1 10*3/uL (ref 0.9–3.3)
LYMPH%: 18.6 % (ref 14.0–48.0)
MCH: 31.5 pg (ref 28.0–33.4)
MCHC: 35.8 g/dL (ref 32.0–35.9)
MCV: 88 fL (ref 82–98)
MONO#: 0.4 10*3/uL (ref 0.1–0.9)
MONO%: 7.2 % (ref 0.0–13.0)
NEUT#: 3.7 10*3/uL (ref 1.5–6.5)
NEUT%: 72.2 % (ref 40.0–80.0)
Platelets: 146 10*3/uL (ref 145–400)
RBC: 4.35 10*6/uL (ref 4.20–5.70)
RDW: 11.9 % (ref 11.1–15.7)
WBC: 5.1 10*3/uL (ref 4.0–10.0)

## 2015-07-03 NOTE — Progress Notes (Signed)
Hematology and Oncology Follow Up Visit  Max Byrd [id number] June 05, 1955 60 y.o. 07/03/2015   Principle Diagnosis:   Thrombocytopenia-medication induced  Current Therapy:    Observation     Interim History:  Max Byrd is back for follow-up. We seem every 6 months.   His platelet count has continued to improve. I suppose that the transient thrombocytopenia was from educations.  I really feel bad that he is having a tell time with his heart. He does see cardiology. He has had his medications adjusted.  He has had no bleeding. There's been no bruising.  He has leg swelling.  He gets short of breath quite easily. I think he sees his cardiologist again in a month.  He's had no fever. He's had no rashes.  There's been no change in bowel or bladder habits.  Overall, his performance status is ECOG 1.   Medications:  Current outpatient prescriptions:  .  ALPRAZolam (XANAX) 1 MG tablet, Take 1 mg by mouth 4 (four) times daily., Disp: , Rfl:  .  amLODipine (NORVASC) 5 MG tablet, Take 1 tablet (5 mg total) by mouth daily., Disp: 30 tablet, Rfl: 11 .  aspirin EC 81 MG EC tablet, Take 1 tablet (81 mg total) by mouth daily., Disp: , Rfl:  .  carisoprodol (SOMA) 350 MG tablet, Take 350 mg by mouth 2 (two) times daily., Disp: , Rfl:  .  clindamycin (CLEOCIN) 150 MG capsule, Take 150 mg by mouth as needed. As needed before dental work, Disp: , Rfl:  .  Diclofenac-Misoprostol (ARTHROTEC) 50-0.2 MG TBEC, Take 1 tablet by mouth 2 (two) times daily., Disp: , Rfl:  .  DULoxetine (CYMBALTA) 30 MG capsule, Take 30 mg by mouth at bedtime., Disp: , Rfl:  .  DULoxetine (CYMBALTA) 60 MG capsule, Take 60 mg by mouth every morning., Disp: , Rfl:  .  Eszopiclone (ESZOPICLONE) 3 MG TABS, Take 3 mg by mouth at bedtime. Take immediately before bedtime, Disp: , Rfl:  .  furosemide (LASIX) 40 MG tablet, TAKE ONE TABLET BY MOUTH ONCE DAILY, Disp: 30 tablet, Rfl: 10 .  hyoscyamine (LEVSIN SL) 0.125 MG SL  tablet, Take 0.125 mg by mouth every 4 (four) hours as needed (IBS). , Disp: , Rfl:  .  irbesartan (AVAPRO) 150 MG tablet, TAKE ONE TABLET BY MOUTH ONCE DAILY, Disp: 30 tablet, Rfl: 4 .  loperamide (IMODIUM) 2 MG capsule, Take 2 mg by mouth as needed for diarrhea or loose stools., Disp: , Rfl:  .  nitroGLYCERIN (NITROSTAT) 0.4 MG SL tablet, Place 1 tablet (0.4 mg total) under the tongue every 5 (five) minutes as needed for chest pain (MAX 3 TABLETS). Patient needs to schedule an appointment, Disp: 25 tablet, Rfl: 1 .  oxyCODONE-acetaminophen (PERCOCET) 10-325 MG per tablet, Take 1 tablet by mouth every 4 (four) hours as needed for pain., Disp: , Rfl:  .  OXYCONTIN 30 MG T12A, Take 30 mg by mouth every 12 (twelve) hours. , Disp: , Rfl:  .  pantoprazole (PROTONIX) 40 MG tablet, TAKE ONE TABLET BY MOUTH ONCE DAILY, Disp: 30 tablet, Rfl: 11 .  sertraline (ZOLOFT) 100 MG tablet, Take 200 mg by mouth every morning. , Disp: , Rfl:  .  sildenafil (VIAGRA) 100 MG tablet, Take 100 mg by mouth as needed for erectile dysfunction. , Disp: , Rfl:  .  simethicone (MYLICON) 0000000 MG chewable tablet, Chew 125 mg by mouth every 6 (six) hours as needed for flatulence., Disp: , Rfl:  .  testosterone cypionate (DEPOTESTOSTERONE CYPIONATE) 200 MG/ML injection, INJECT ONE MILLILITER IN THE MUSCLE EVERY TWO WEEKS, Disp: , Rfl: 3 .  zolpidem (AMBIEN) 10 MG tablet, Take 10 mg by mouth daily., Disp: , Rfl: 5  Allergies:  Allergies  Allergen Reactions  . Aspirin Other (See Comments)    H/O BLEEDING ULCER  . Buspirone Other (See Comments)    MEMORY LOSS  . Nsaids Other (See Comments)    Stomach bleeding  . Tolmetin Other (See Comments)    Stomach bleeding  . Zoloft [Sertraline Hcl] Other (See Comments)    FELT TERRIBLE  . Klonopin [Clonazepam] Other (See Comments)    Erectile dysfunction  . Beta Adrenergic Blockers Nausea Only    Metoprolol  . Reglan [Metoclopramide] Anxiety    EXCITABILITY     Past Medical  History, Surgical history, Social history, and Family History were reviewed and updated.  Review of Systems: As above  Physical Exam:  height is 5\' 6"  (1.676 m) and weight is 180 lb (81.647 kg). His oral temperature is 97.7 F (36.5 C). His blood pressure is 137/75 and his pulse is 74. His respiration is 16.   Wt Readings from Last 3 Encounters:  07/03/15 180 lb (81.647 kg)  01/02/15 170 lb (77.111 kg)  08/28/14 167 lb 9.6 oz (76.023 kg)     Well-developed and well-nourished white male in no obvious distress. Head and neck exam shows no ocular or oral lesions. There are no palpable cervical or supraclavicular lymph nodes. Lungs are clear bilaterally. Cardiac exam shows a regular rate and rhythm with no murmurs, rubs or bruits. Abdomen is soft. She he has good bowel sounds. There is no fluid wave. There is no palpable liver or spleen tip. Back exam shows no tenderness over the spine, ribs or hips. Extremities shows no clubbing, cyanosis or edema. Skin exam shows no rashes, ecchymoses or petechia.  Lab Results  Component Value Date   WBC 5.1 07/03/2015   HGB 13.7 07/03/2015   HCT 38.3* 07/03/2015   MCV 88 07/03/2015   PLT 146 07/03/2015     Chemistry      Component Value Date/Time   NA 139 03/08/2014 1230   NA 139 11/15/2013 1159   K 4.2 03/08/2014 1230   K 4.6 11/15/2013 1159   CL 101 03/08/2014 1230   CL 107 11/15/2013 1159   CO2 30 03/08/2014 1230   CO2 29 11/15/2013 1159   BUN 27* 03/08/2014 1230   BUN 32* 11/15/2013 1159   CREATININE 1.4* 03/08/2014 1230   CREATININE 1.2 11/15/2013 1159      Component Value Date/Time   CALCIUM 8.8 03/08/2014 1230   CALCIUM 8.8 11/15/2013 1159   ALKPHOS 51 03/08/2014 1230   ALKPHOS 57 02/10/2013 0932   AST 24 03/08/2014 1230   AST 20 02/10/2013 0932   ALT 23 03/08/2014 1230   ALT 18 02/10/2013 0932   BILITOT 1.00 03/08/2014 1230   BILITOT 0.5 02/10/2013 0932         Impression and Plan: Max Byrd is 60 year old white  male.  His platelet count is back up to normal. As such, I relate all think that we will have to see him back.  He has other health issues. He feels that his cardiac status is really becoming a problem and minimizing how much he can do.  I don't think we have to waste his time as he is a symptomatic. I suppose his platelet count may fluctuate a little bit.  I will be more than happy to see him back at his convenience  Volanda Napoleon, MD 5/17/20179:20 AM

## 2015-08-24 ENCOUNTER — Other Ambulatory Visit: Payer: Self-pay | Admitting: Interventional Cardiology

## 2015-09-02 ENCOUNTER — Other Ambulatory Visit: Payer: Self-pay | Admitting: Interventional Cardiology

## 2015-09-26 DIAGNOSIS — M65341 Trigger finger, right ring finger: Secondary | ICD-10-CM | POA: Insufficient documentation

## 2015-10-01 ENCOUNTER — Other Ambulatory Visit: Payer: Self-pay | Admitting: Interventional Cardiology

## 2015-10-15 ENCOUNTER — Other Ambulatory Visit: Payer: Self-pay | Admitting: Interventional Cardiology

## 2015-10-15 NOTE — Telephone Encounter (Signed)
irbesartan (AVAPRO) 150 MG tablet  Medication  Date: 10/01/2015 Department: [facility]/Authorizing: Belva Crome, MD  Order Providers   Prescribing Provider Encounter Provider  Belva Crome, MD Belva Crome, MD  Medication Detail    Disp Refills Start End   irbesartan (AVAPRO) 150 MG tablet 15 tablet 0 10/01/2015    Sig - Route: Take 1 tablet (150 mg total) by mouth daily. Patient is overdue for an appointment. Please call and schedule for further refills - Oral   E-Prescribing Status: Receipt confirmed by pharmacy (10/01/2015 5:08 PM EDT)   [place], [place], [place]

## 2015-10-16 ENCOUNTER — Other Ambulatory Visit: Payer: Self-pay | Admitting: *Deleted

## 2015-10-16 MED ORDER — NITROGLYCERIN 0.4 MG SL SUBL: 0.4000 mg | SUBLINGUAL_TABLET | SUBLINGUAL | 1 refills | Status: DC | PRN

## 2015-10-21 ENCOUNTER — Other Ambulatory Visit: Payer: Self-pay | Admitting: Interventional Cardiology

## 2015-10-30 ENCOUNTER — Other Ambulatory Visit: Payer: Self-pay | Admitting: Internal Medicine

## 2015-10-30 DIAGNOSIS — R1084 Generalized abdominal pain: Secondary | ICD-10-CM

## 2015-10-31 ENCOUNTER — Other Ambulatory Visit: Payer: Self-pay | Admitting: Internal Medicine

## 2015-10-31 ENCOUNTER — Other Ambulatory Visit: Payer: Self-pay | Admitting: Interventional Cardiology

## 2015-10-31 ENCOUNTER — Ambulatory Visit
Admission: RE | Admit: 2015-10-31 | Discharge: 2015-10-31 | Disposition: A | Discharge status: Home / Self Care | Payer: Federal, State, Local not specified - PPO | Source: Ambulatory Visit | Attending: Internal Medicine | Admitting: Internal Medicine

## 2015-10-31 DIAGNOSIS — R1084 Generalized abdominal pain: Secondary | ICD-10-CM

## 2015-10-31 DIAGNOSIS — I25810 Atherosclerosis of coronary artery bypass graft(s) without angina pectoris: Secondary | ICD-10-CM

## 2015-10-31 DIAGNOSIS — R0602 Shortness of breath: Secondary | ICD-10-CM

## 2015-11-01 ENCOUNTER — Other Ambulatory Visit: Payer: Self-pay

## 2015-11-01 MED ORDER — IRBESARTAN 150 MG PO TABS: 150.0000 mg | ORAL_TABLET | Freq: Every day | ORAL | 1 refills | Status: DC

## 2015-11-07 ENCOUNTER — Encounter: Payer: Self-pay | Admitting: Interventional Cardiology

## 2015-11-13 ENCOUNTER — Other Ambulatory Visit: Payer: Self-pay

## 2015-11-13 ENCOUNTER — Ambulatory Visit (HOSPITAL_COMMUNITY): Payer: Federal, State, Local not specified - PPO | Attending: Cardiovascular Disease

## 2015-11-13 DIAGNOSIS — N189 Chronic kidney disease, unspecified: Secondary | ICD-10-CM | POA: Diagnosis not present

## 2015-11-13 DIAGNOSIS — I1310 Hypertensive heart and chronic kidney disease without heart failure, with stage 1 through stage 4 chronic kidney disease, or unspecified chronic kidney disease: Secondary | ICD-10-CM | POA: Diagnosis not present

## 2015-11-13 DIAGNOSIS — R0602 Shortness of breath: Secondary | ICD-10-CM | POA: Diagnosis not present

## 2015-11-13 DIAGNOSIS — I25810 Atherosclerosis of coronary artery bypass graft(s) without angina pectoris: Secondary | ICD-10-CM | POA: Diagnosis not present

## 2015-11-13 DIAGNOSIS — E785 Hyperlipidemia, unspecified: Secondary | ICD-10-CM | POA: Diagnosis not present

## 2015-11-13 DIAGNOSIS — Z953 Presence of xenogenic heart valve: Secondary | ICD-10-CM | POA: Insufficient documentation

## 2015-11-13 DIAGNOSIS — I2510 Atherosclerotic heart disease of native coronary artery without angina pectoris: Secondary | ICD-10-CM | POA: Diagnosis present

## 2015-11-25 ENCOUNTER — Ambulatory Visit (INDEPENDENT_AMBULATORY_CARE_PROVIDER_SITE_OTHER): Payer: Federal, State, Local not specified - PPO | Admitting: Interventional Cardiology

## 2015-11-25 ENCOUNTER — Encounter: Payer: Self-pay | Admitting: Interventional Cardiology

## 2015-11-25 VITALS — BP 140/92 | HR 69 | Ht 66.0 in | Wt 182.6 lb

## 2015-11-25 DIAGNOSIS — I5032 Chronic diastolic (congestive) heart failure: Secondary | ICD-10-CM

## 2015-11-25 DIAGNOSIS — I10 Essential (primary) hypertension: Secondary | ICD-10-CM

## 2015-11-25 DIAGNOSIS — F329 Major depressive disorder, single episode, unspecified: Secondary | ICD-10-CM

## 2015-11-25 DIAGNOSIS — Z952 Presence of prosthetic heart valve: Secondary | ICD-10-CM

## 2015-11-25 DIAGNOSIS — I25810 Atherosclerosis of coronary artery bypass graft(s) without angina pectoris: Secondary | ICD-10-CM | POA: Diagnosis not present

## 2015-11-25 DIAGNOSIS — F0631 Mood disorder due to known physiological condition with depressive features: Secondary | ICD-10-CM

## 2015-11-25 NOTE — Progress Notes (Signed)
Cardiology Office Note    Date:  11/25/2015   ID:  Max Byrd, DOB 1955-09-12, MRN [id number]  PCP:  Wenda Low, MD  Cardiologist: Sinclair Grooms, MD   Chief Complaint  Patient presents with  . Cardiac Valve Problem    History of Present Illness:  Max Byrd is a 60 y.o. male who presents for chronic kidney disease, bicuspid aortic valve treated with pericardial tissue valve 2014, hypertension, CAD with angina, circumflex stent after failed saphenous vein graft to 2014, and depression.   Accompanied by his wife. He seems angry. He repeats on this office visit as he has since he had surgery that "if I knew I was going to feel like this I would've never had surgery". His major complaint is that he is unable tohunt and participate in outdoors activity. He gives out of energy quickly. He denies palpitations. He occasionally has chest discomfort. These episodes occur at rest. They're very mild. He occasionally uses nitroglycerin if it stays present for longer than 20-40 minutes. When he exercises he is mostly lifting weights.   Past Medical History:  Diagnosis Date  . Anginal pain ([place]) 2015   "since valve was done"  . Anxiety   . Arthritis    "left pointer finger; forminal openings bilaterally" (08/10/2013)  . Bicuspid aortic valve    a. 01/2013 s/p AVR - 28mm Edwards 3300 TFX pericardial tissue valve, ser # [id number].  Marland Kitchen CKD (chronic kidney disease) stage 2, GFR 60-89 ml/min   . Coronary atherosclerosis of native coronary artery    a. 01/2013 CABGx3: LIMA->LAD, VG->Diag, VG->OM (performed @ time of AVR),  occluded SVG-OM2 , now with 95% stenosis at the anastomosis site on OM 2 - PCI attempt 07/28/13 unsuccessful - med Rx cont'd   . Depression   . Erectile dysfunction    INJECTIONS OF PROSTAGLANDIN BY UROLGIST  . Essential hypertension, benign   . H/O Bell's palsy 2010 X2  . Heart murmur   . History of blood transfusion    "related to stomach bleeding from ASA & NSAIDS  .  History of stomach ulcers   . Hyperlipidemia   . IBS (irritable bowel syndrome)   . Migraine    "went away in the 1990's"  . Pain, chronic postoperative    S/P LEFT ARM SURGERY  . Renal cell carcinoma of right kidney ([place]) 2003    Past Surgical History:  Procedure Laterality Date  . ANAL SPHINCTEROTOMY  04/13/03   DR. GRAPEY  . AORTIC VALVE REPLACEMENT N/A 02/14/2013   Procedure: AORTIC VALVE REPLACEMENT (AVR);  Surgeon: Ivin Poot, MD;  Location: [facility];  Service: Open Heart Surgery;  Laterality: N/A;  . CARDIAC CATHETERIZATION  2010; 6/12015; 07/29/2013  . CARDIAC VALVE REPLACEMENT    . COLONOSCOPY  2008   DIVERTICULOSIS  . CORONARY ANGIOPLASTY WITH STENT PLACEMENT  08/10/2013   "1"  . CORONARY ARTERY BYPASS GRAFT N/A 02/14/2013   Procedure: CORONARY ARTERY BYPASS GRAFTING (CABG);  Surgeon: Ivin Poot, MD;  Location: [facility];  Service: Open Heart Surgery;  Laterality: N/A;  . ELBOW SURGERY Left X 2  . ELBOW SURGERY Right X 1  . EXCISIONAL HEMORRHOIDECTOMY    . HERNIA REPAIR Left 1957  . INTRAOPERATIVE TRANSESOPHAGEAL ECHOCARDIOGRAM N/A 02/14/2013   Procedure: INTRAOPERATIVE TRANSESOPHAGEAL ECHOCARDIOGRAM;  Surgeon: Ivin Poot, MD;  Location: [facility];  Service: Open Heart Surgery;  Laterality: N/A;  . LATERAL EPICONDYLE RELEASE Left 07/29/07   DR. GRAVES  .  LEFT AND RIGHT HEART CATHETERIZATION WITH CORONARY ANGIOGRAM N/A 01/27/2013   Procedure: LEFT AND RIGHT HEART CATHETERIZATION WITH CORONARY ANGIOGRAM;  Surgeon: Sinclair Grooms, MD;  Location: [facility] C[facility] LAB;  Service: Cardiovascular;  Laterality: N/A;  . LEFT HEART CATHETERIZATION WITH CORONARY/GRAFT ANGIOGRAM N/A 07/28/2013   Procedure: LEFT HEART CATHETERIZATION WITH Beatrix Fetters;  Surgeon: Leonie Man, MD;  Location: [facility] CATH LAB;  Service: Cardiovascular;  Laterality: N/A;  . PARTIAL NEPHRECTOMY  08/24/02   RIGHT...DR. Risa Grill  . PERCUTANEOUS STENT INTERVENTION N/A 08/10/2013   Procedure: PERCUTANEOUS  STENT INTERVENTION;  Surgeon: Sinclair Grooms, MD;  Location: [facility] CATH LAB;  Service: Cardiovascular;  Laterality: N/A;  . TOE SURGERY Right    "shaved; wired back straight"  . TONSILLECTOMY      Current Medications: Outpatient Medications Prior to Visit  Medication Sig Dispense Refill  . ALPRAZolam (XANAX) 1 MG tablet Take 1 mg by mouth 4 (four) times daily.    Marland Kitchen amLODipine (NORVASC) 5 MG tablet Take 1 tablet (5 mg total) by mouth daily. Please keep scheduled 01/15/16 appt to get further refills 120 tablet 0  . aspirin EC 81 MG EC tablet Take 1 tablet (81 mg total) by mouth daily.    . carisoprodol (SOMA) 350 MG tablet Take 350 mg by mouth 2 (two) times daily.    . clindamycin (CLEOCIN) 150 MG capsule Take 150 mg by mouth as needed. As needed before dental work    . Diclofenac-Misoprostol (ARTHROTEC) 50-0.2 MG TBEC Take 1 tablet by mouth 2 (two) times daily.    . DULoxetine (CYMBALTA) 60 MG capsule Take 60 mg by mouth 2 (two) times daily.     . furosemide (LASIX) 40 MG tablet TAKE ONE TABLET BY MOUTH ONCE DAILY 30 tablet 10  . hyoscyamine (LEVSIN SL) 0.125 MG SL tablet Take 0.125 mg by mouth every 4 (four) hours as needed (IBS).     Marland Kitchen irbesartan (AVAPRO) 150 MG tablet Take 1 tablet (150 mg total) by mouth daily. 30 tablet 1  . loperamide (IMODIUM) 2 MG capsule Take 2 mg by mouth as needed for diarrhea or loose stools.    . nitroGLYCERIN (NITROSTAT) 0.4 MG SL tablet Place 1 tablet (0.4 mg total) under the tongue every 5 (five) minutes as needed for chest pain (MAX 3 TABLETS). 25 tablet 1  . oxyCODONE-acetaminophen (PERCOCET) 10-325 MG per tablet Take 1 tablet by mouth every 4 (four) hours as needed for pain.    . OXYCONTIN 30 MG T12A Take 30 mg by mouth every 12 (twelve) hours.     . sertraline (ZOLOFT) 100 MG tablet Take 200 mg by mouth every morning.     . sildenafil (VIAGRA) 100 MG tablet Take 100 mg by mouth as needed for erectile dysfunction.     . simethicone (MYLICON) 0000000 MG chewable  tablet Chew 125 mg by mouth every 6 (six) hours as needed for flatulence.    . testosterone cypionate (DEPOTESTOSTERONE CYPIONATE) 200 MG/ML injection INJECT ONE MILLILITER IN THE MUSCLE EVERY TWO WEEKS  3  . zolpidem (AMBIEN) 10 MG tablet Take 10 mg by mouth daily.  5  . DULoxetine (CYMBALTA) 30 MG capsule Take 30 mg by mouth at bedtime.    . Eszopiclone (ESZOPICLONE) 3 MG TABS Take 3 mg by mouth at bedtime. Take immediately before bedtime    . pantoprazole (PROTONIX) 40 MG tablet TAKE ONE TABLET BY MOUTH ONCE DAILY (Patient not taking: Reported on 11/25/2015) 30 tablet 11  No facility-administered medications prior to visit.      Allergies:   Aspirin; Buspirone; Nsaids; Tolmetin; Zoloft [sertraline hcl]; Klonopin [clonazepam]; Beta adrenergic blockers; and Reglan [metoclopramide]   Social History   Social History  . Marital status: Married    Spouse name: N/A  . Number of children: N/A  . Years of education: N/A   Social History Main Topics  . Smoking status: Former Smoker    Packs/day: 1.50    Years: 6.00    Types: Cigarettes    Start date: 10/06/1969    Quit date: 01/21/1976  . Smokeless tobacco: Current User    Types: Snuff     Comment: quit  36 years ago  . Alcohol use 0.0 oz/week     Comment: "last beer was 01/2013"  . Drug use: No  . Sexual activity: Yes   Other Topics Concern  . None   Social History Narrative   Was working out regularly prior to [employer].  Hunts/fishes.     Family History:  The patient's family history includes Cancer in his mother; Heart disease in his father.   ROS:   Please see the history of present illness.    Anger and sadness. Decreased energy. Chronic diarrhea since surgery. Spontaneous episodes of diaphoresis. Abdominal pain. Depression. Cough and shortness of breath with activity.  All other systems reviewed and are negative.   PHYSICAL EXAM:   VS:  BP (!) 140/92   Pulse 69   Ht 5\' 6"  (1.676 m)   Wt 182 lb 9.6 oz (82.8 kg)    BMI 29.47 kg/m    GEN: Well nourished, well developed, in no acute distress  HEENT: normal  Neck: no JVD, carotid bruits, or masses Cardiac: RRR; 3/6 crescendo decrescendo murmur of aortic outflow. No rubs, or gallops, and no edema . Respiratory:  clear to auscultation bilaterally, normal work of breathing GI: soft, nontender, nondistended, + BS MS: no deformity or atrophy  Skin: warm and dry, no rash Neuro:  Alert and Oriented x 3, Strength and sensation are intact Psych: euthymic mood, full affect  Wt Readings from Last 3 Encounters:  11/25/15 182 lb 9.6 oz (82.8 kg)  07/03/15 180 lb (81.6 kg)  01/02/15 170 lb (77.1 kg)      Studies/Labs Reviewed:   EKG:  EKG  Left bundle branch block, normal sinus rhythm, left axis deviation. Since the prior tracing in January 2016, QRS duration has increased to 140 ms.  Recent Labs: 07/03/2015: HGB 13.7; Platelets 146   Lipid Panel No results found for: CHOL, TRIG, HDL, CHOLHDL, VLDL, LDLCALC, LDLDIRECT  Additional studies/ records that were reviewed today include:   11/13/15 Echocardiogram: Study Conclusions   - Left ventricle: The cavity size was mildly dilated. Wall   thickness was increased in a pattern of moderate LVH. Systolic   function was normal. The estimated ejection fraction was in the   range of 55% to 60%. Doppler parameters are consistent with both   elevated ventricular end-diastolic filling pressure and elevated   left atrial filling pressure. - Aortic valve: Bioprosthetic AVR Leaflets appear calcified but   open well .No perivalvular regurgitation   Gradients lower than those recorded in July of 2016 when they   were mean 30 and peak of 56 mmHg. - Left atrium: The atrium was mildly dilated. - Atrial septum: No defect or patent foramen ovale was identified.      ASSESSMENT:    1. Coronary artery disease involving coronary bypass graft of  native heart without angina pectoris   2. Chronic diastolic heart failure  ([place])   3. Essential hypertension   4. S/P AVR (aortic valve replacement)   5. Depression due to physical illness      PLAN:  In order of problems listed above:  1. Use nitroglycerin for recurring episodes of chest discomfort. Because these episodes occur at rest I have a clinical suspicion that they are non-ischemia related and possibly esophageal. He will report response to nitroglycerin if it is used in such a way. 2. No evidence of volume overload. 3. Good control. 130/90 mmHg or less is our target. 4. The aortic valve is thickened and this functional although not significantly different than one year ago. The leaflets exhibit decreased mobility. No regurgitation is noted. Peak gradient is less than 50 mmHg. I advised against heavy isometric activity. We have discussed this on numerous occasions. He prefers to lift heavy weights as a mode of exercise. Weight lifting was discouraged. Repeat echo in one year. 5. Strongly encouraged the patient to be active and continued to fight to maintain physical fitness. Finally told the patient that if he is unable to then do outdoors activities as before, things will not get any better at this point. Discussed longevity and then at the very least operation has likely helped in that regard even if he has not returned to a level of physical fitness said he had hoped.  This was an extensive office visit with greater than 50% of the 40 minute visit being spent in counseling related to depression.  Medication Adjustments/Labs and Tests Ordered: Current medicines are reviewed at length with the patient today.  Concerns regarding medicines are outlined above.  Medication changes, Labs and Tests ordered today are listed in the Patient Instructions below. Patient Instructions  Medication Instructions:  None  Labwork: None  Testing/Procedures: Your physician has requested that you have an echocardiogram in 1 year prior to your follow up appointment.  Echocardiography is a painless test that uses sound waves to create images of your heart. It provides your doctor with information about the size and shape of your heart and how well your heart's chambers and valves are working. This procedure takes approximately one hour. There are no restrictions for this procedure.    Follow-Up: Your physician wants you to follow-up in: 1 year with Dr. Tamala Julian. You will receive a reminder letter in the mail two months in advance. If you don't receive a letter, please call our office to schedule the follow-up appointment.   Any Other Special Instructions Will Be Listed Below (If Applicable).     If you need a refill on your cardiac medications before your next appointment, please call your pharmacy.      Signed, Sinclair Grooms, MD  11/25/2015 1:46 PM    [facility] [address], [place], [place]  [address] Phone: [phone]; Fax: [phone]

## 2015-11-25 NOTE — Patient Instructions (Addendum)
Medication Instructions:  None  Labwork: None  Testing/Procedures: Your physician has requested that you have an echocardiogram in 1 year prior to your follow up appointment. Echocardiography is a painless test that uses sound waves to create images of your heart. It provides your doctor with information about the size and shape of your heart and how well your heart's chambers and valves are working. This procedure takes approximately one hour. There are no restrictions for this procedure.    Follow-Up: Your physician wants you to follow-up in: 1 year with Dr. Tamala Julian. You will receive a reminder letter in the mail two months in advance. If you don't receive a letter, please call our office to schedule the follow-up appointment.   Any Other Special Instructions Will Be Listed Below (If Applicable).     If you need a refill on your cardiac medications before your next appointment, please call your pharmacy.

## 2015-12-02 ENCOUNTER — Other Ambulatory Visit: Payer: Self-pay | Admitting: Interventional Cardiology

## 2016-01-03 ENCOUNTER — Other Ambulatory Visit: Payer: Self-pay | Admitting: Interventional Cardiology

## 2016-01-15 ENCOUNTER — Ambulatory Visit: Payer: Federal, State, Local not specified - PPO | Admitting: Interventional Cardiology

## 2016-02-12 ENCOUNTER — Other Ambulatory Visit: Payer: Self-pay | Admitting: Interventional Cardiology

## 2016-02-25 DIAGNOSIS — M47812 Spondylosis without myelopathy or radiculopathy, cervical region: Secondary | ICD-10-CM | POA: Insufficient documentation

## 2016-11-13 ENCOUNTER — Other Ambulatory Visit: Payer: Self-pay | Admitting: Interventional Cardiology

## 2016-11-25 ENCOUNTER — Other Ambulatory Visit: Payer: Self-pay

## 2016-11-25 ENCOUNTER — Ambulatory Visit (HOSPITAL_COMMUNITY): Payer: Federal, State, Local not specified - PPO | Attending: Cardiology

## 2016-11-25 DIAGNOSIS — I340 Nonrheumatic mitral (valve) insufficiency: Secondary | ICD-10-CM | POA: Insufficient documentation

## 2016-11-25 DIAGNOSIS — Q231 Congenital insufficiency of aortic valve: Secondary | ICD-10-CM | POA: Insufficient documentation

## 2016-11-25 DIAGNOSIS — N189 Chronic kidney disease, unspecified: Secondary | ICD-10-CM | POA: Diagnosis not present

## 2016-11-25 DIAGNOSIS — Z48812 Encounter for surgical aftercare following surgery on the circulatory system: Secondary | ICD-10-CM | POA: Insufficient documentation

## 2016-11-25 DIAGNOSIS — I2510 Atherosclerotic heart disease of native coronary artery without angina pectoris: Secondary | ICD-10-CM | POA: Diagnosis not present

## 2016-11-25 DIAGNOSIS — Z952 Presence of prosthetic heart valve: Secondary | ICD-10-CM

## 2016-11-25 DIAGNOSIS — I129 Hypertensive chronic kidney disease with stage 1 through stage 4 chronic kidney disease, or unspecified chronic kidney disease: Secondary | ICD-10-CM | POA: Insufficient documentation

## 2016-11-25 DIAGNOSIS — I359 Nonrheumatic aortic valve disorder, unspecified: Secondary | ICD-10-CM | POA: Diagnosis present

## 2016-11-25 LAB — ECHOCARDIOGRAM COMPLETE
AOVTI: 60.7 cm
AV Mean grad: 17 mmHg
AV VEL mean LVOT/AV: 0.22
AVPG: 33 mmHg
AVPKVEL: 287 cm/s
Ao pk vel: 0.27 m/s
CHL CUP RV SYS PRESS: 25 mmHg
DOP CAL AO MEAN VELOCITY: 198 cm/s
E decel time: 222 msec
EERAT: 21.32
FS: 26 % — AB (ref 28–44)
IVS/LV PW RATIO, ED: 1.09
LA vol: 41.2 mL
LADIAMINDEX: 2.03 cm/m2
LASIZE: 39 mm
LAVOLA4C: 34.6 mL
LAVOLIN: 21.5 mL/m2
LEFT ATRIUM END SYS DIAM: 39 mm
LV E/e' medial: 21.32
LV PW d: 12.3 mm — AB (ref 0.6–1.1)
LV TDI E'MEDIAL: 5.19
LV e' LATERAL: 5.3 cm/s
LVEEAVG: 21.32
LVOT VTI: 19.8 cm
LVOT peak VTI: 0.33 cm
LVOT peak vel: 77.6 cm/s
Lateral S' vel: 7.29 cm/s
MV Dec: 222
MV Peak grad: 5 mmHg
MV pk A vel: 106 m/s
MV pk E vel: 113 m/s
Reg peak vel: 232 cm/s
TDI e' lateral: 5.3
TR max vel: 232 cm/s

## 2016-12-21 ENCOUNTER — Other Ambulatory Visit: Payer: Self-pay | Admitting: *Deleted

## 2016-12-21 ENCOUNTER — Telehealth: Payer: Self-pay | Admitting: Interventional Cardiology

## 2016-12-21 MED ORDER — IRBESARTAN 150 MG PO TABS: 150.0000 mg | ORAL_TABLET | Freq: Every day | ORAL | 0 refills | Status: DC

## 2016-12-21 NOTE — Telephone Encounter (Signed)
New message    Patient states he is out of medication.   *STAT* If patient is at the pharmacy, call can be transferred to refill team.   1. Which medications need to be refilled? (please list name of each medication and dose if known) irbesartan (AVAPRO) 150 MG tablet  2. Which pharmacy/location (including street and city if local pharmacy) is medication to be sent to? [facility]   3. Do they need a 30 day or 90 day supply? [place]

## 2017-01-04 ENCOUNTER — Encounter: Payer: Self-pay | Admitting: Cardiology

## 2017-01-13 NOTE — Progress Notes (Signed)
Cardiology Office Note   Date:  01/14/2017   ID:  Max Byrd, DOB 11/21/1955, MRN [id number]  PCP:  Wenda Low, MD  Cardiologist:  Dr. Tamala Julian    Chief Complaint  Patient presents with  . Coronary Artery Disease    no chest pain      History of Present Illness: Max Byrd is a 61 y.o. male who presents for CAD and AVR in 2014.     chronic kidney disease, bicuspid aortic valve treated with pericardial tissue valve 2014, hypertension, CAD with hx angina, after CABG, rec'd circumflex stent after failed saphenous vein graft to 2014, and depression.  Wt lifting has been discouraged but he does use moderate weights..    Echo 11/2016 with EF 55-60% the bioprosthetic aortic valve was functioning normally.  G2DD.   Today he has occ chest pain relieved by 2 NTG.  This may happen once every 2 months, some SOB.  He is sad that he cannot do his deer hunting and realizes it is due to the pain meds for his neck.  He also blames some on his valve.  We discussed legalized marijuana which he would prefer than narcotics.  He did state he was ready for the next part of his journey meaning death but he has discussed with his therapist and when delving further he means if he has another heart attack he will not go to the hospital.  He will just die at home.    He eats healthy and does enjoy playing his guitar.   He does have increased edema at times so takes an extra half of lasix.   Past Medical History:  Diagnosis Date  . Anginal pain ([place]) 2015   "since valve was done"  . Anxiety   . Arthritis    "left pointer finger; forminal openings bilaterally" (08/10/2013)  . Bicuspid aortic valve    a. 01/2013 s/p AVR - 93mm Edwards 3300 TFX pericardial tissue valve, ser # [id number].  Marland Kitchen CKD (chronic kidney disease) stage 2, GFR 60-89 ml/min   . Coronary atherosclerosis of native coronary artery    a. 01/2013 CABGx3: LIMA->LAD, VG->Diag, VG->OM (performed @ time of AVR),  occluded SVG-OM2 , now with  95% stenosis at the anastomosis site on OM 2 - PCI attempt 07/28/13 unsuccessful - med Rx cont'd   . Depression   . Erectile dysfunction    INJECTIONS OF PROSTAGLANDIN BY UROLGIST  . Essential hypertension, benign   . H/O Bell's palsy 2010 X2  . Heart murmur   . History of blood transfusion    "related to stomach bleeding from ASA & NSAIDS  . History of stomach ulcers   . Hyperlipidemia   . IBS (irritable bowel syndrome)   . Migraine    "went away in the 1990's"  . Pain, chronic postoperative    S/P LEFT ARM SURGERY  . Renal cell carcinoma of right kidney ([place]) 2003    Past Surgical History:  Procedure Laterality Date  . ANAL SPHINCTEROTOMY  04/13/03   DR. GRAPEY  . AORTIC VALVE REPLACEMENT N/A 02/14/2013   Procedure: AORTIC VALVE REPLACEMENT (AVR);  Surgeon: Ivin Poot, MD;  Location: [facility];  Service: Open Heart Surgery;  Laterality: N/A;  . CARDIAC CATHETERIZATION  2010; 6/12015; 07/29/2013  . CARDIAC VALVE REPLACEMENT    . COLONOSCOPY  2008   DIVERTICULOSIS  . CORONARY ANGIOPLASTY WITH STENT PLACEMENT  08/10/2013   "1"  . CORONARY ARTERY BYPASS GRAFT N/A 02/14/2013  Procedure: CORONARY ARTERY BYPASS GRAFTING (CABG);  Surgeon: Ivin Poot, MD;  Location: [facility];  Service: Open Heart Surgery;  Laterality: N/A;  . ELBOW SURGERY Left X 2  . ELBOW SURGERY Right X 1  . EXCISIONAL HEMORRHOIDECTOMY    . HERNIA REPAIR Left 1957  . INTRAOPERATIVE TRANSESOPHAGEAL ECHOCARDIOGRAM N/A 02/14/2013   Procedure: INTRAOPERATIVE TRANSESOPHAGEAL ECHOCARDIOGRAM;  Surgeon: Ivin Poot, MD;  Location: [facility];  Service: Open Heart Surgery;  Laterality: N/A;  . LATERAL EPICONDYLE RELEASE Left 07/29/07   DR. GRAVES  . LEFT AND RIGHT HEART CATHETERIZATION WITH CORONARY ANGIOGRAM N/A 01/27/2013   Procedure: LEFT AND RIGHT HEART CATHETERIZATION WITH CORONARY ANGIOGRAM;  Surgeon: Sinclair Grooms, MD;  Location: [facility] C[facility] LAB;  Service: Cardiovascular;  Laterality: N/A;  . LEFT HEART  CATHETERIZATION WITH CORONARY/GRAFT ANGIOGRAM N/A 07/28/2013   Procedure: LEFT HEART CATHETERIZATION WITH Beatrix Fetters;  Surgeon: Leonie Man, MD;  Location: [facility] CATH LAB;  Service: Cardiovascular;  Laterality: N/A;  . PARTIAL NEPHRECTOMY  08/24/02   RIGHT...DR. Risa Grill  . PERCUTANEOUS STENT INTERVENTION N/A 08/10/2013   Procedure: PERCUTANEOUS STENT INTERVENTION;  Surgeon: Sinclair Grooms, MD;  Location: [facility] CATH LAB;  Service: Cardiovascular;  Laterality: N/A;  . TOE SURGERY Right    "shaved; wired back straight"  . TONSILLECTOMY       Current Outpatient Medications  Medication Sig Dispense Refill  . ALPRAZolam (XANAX) 1 MG tablet Take 1 mg by mouth 4 (four) times daily.    Marland Kitchen amLODipine (NORVASC) 5 MG tablet Take 1 tablet (5 mg total) by mouth daily. Please keep scheduled 01/15/16 appt to get further refills 120 tablet 3  . aspirin EC 81 MG EC tablet Take 1 tablet (81 mg total) by mouth daily.    . carisoprodol (SOMA) 350 MG tablet Take 350 mg by mouth 2 (two) times daily.    . clindamycin (CLEOCIN) 150 MG capsule Take 150 mg by mouth 3 (three) times daily. Every six months    . Diclofenac-Misoprostol (ARTHROTEC) 50-0.2 MG TBEC Take 1 tablet by mouth 2 (two) times daily.    . DULoxetine (CYMBALTA) 60 MG capsule Take 60 mg by mouth 2 (two) times daily.     . furosemide (LASIX) 40 MG tablet TAKE ONE TABLET BY MOUTH ONCE DAILY 30 tablet 10  . hyoscyamine (LEVSIN SL) 0.125 MG SL tablet Take 0.125 mg by mouth every 4 (four) hours as needed (IBS).     Marland Kitchen irbesartan (AVAPRO) 150 MG tablet Take 1 tablet (150 mg total) daily by mouth. Please keep 01/14/17 appointment for further refills 30 tablet 0  . loperamide (IMODIUM) 2 MG capsule Take 2 mg by mouth as needed for diarrhea or loose stools.    . nitroGLYCERIN (NITROSTAT) 0.4 MG SL tablet Place 1 tablet (0.4 mg total) under the tongue every 5 (five) minutes as needed for chest pain (MAX 3 TABLETS). 25 tablet 3  . OXYCONTIN 30 MG T12A  Take 30 mg by mouth every 12 (twelve) hours.     . sertraline (ZOLOFT) 100 MG tablet Take 200 mg by mouth every morning.     . sildenafil (VIAGRA) 100 MG tablet Take 100 mg by mouth as needed for erectile dysfunction.     . Tapentadol HCl 100 MG TABS Take 4 tablets by mouth daily.     Marland Kitchen testosterone cypionate (DEPOTESTOSTERONE CYPIONATE) 200 MG/ML injection INJECT ONE MILLILITER IN THE MUSCLE EVERY TWO WEEKS  3  . zolpidem (AMBIEN) 10 MG tablet  Take 10 mg by mouth daily.  5   No current facility-administered medications for this visit.     Allergies:   Aspirin; Buspirone; Nsaids; Tolmetin; Zoloft [sertraline hcl]; Klonopin [clonazepam]; Beta adrenergic blockers; and Reglan [metoclopramide]    Social History:  The patient  reports that he quit smoking about 41 years ago. His smoking use included cigarettes. He started smoking about 47 years ago. He has a 9.00 pack-year smoking history. His smokeless tobacco use includes snuff. He reports that he drinks alcohol. He reports that he does not use drugs.   Family History:  The patient's family history includes Cancer in his mother; Heart disease in his father.    ROS:  General:no colds or fevers, + weight loss, just not much energy except in the AM Skin:no rashes or ulcers HEENT:no blurred vision, no congestion CV:see HPI PUL:see HPI GI:no diarrhea constipation or melena, no indigestion GU:no hematuria, no dysuria MS:no joint pain, no claudication, chronic pain from cervical facet joint syndrome Neuro:no syncope, no lightheadedness Endo:no diabetes, no thyroid disease  Wt Readings from Last 3 Encounters:  01/14/17 173 lb 6.4 oz (78.7 kg)  11/25/15 182 lb 9.6 oz (82.8 kg)  07/03/15 180 lb (81.6 kg)     PHYSICAL EXAM: VS:  BP 116/82   Pulse 76   Resp 16   Ht 5\' 6"  (1.676 m)   Wt 173 lb 6.4 oz (78.7 kg)   BMI 27.99 kg/m  , BMI Body mass index is 27.99 kg/m. General:Pleasant affect, NAD Skin:Warm and dry, brisk capillary  refill HEENT:normocephalic, sclera clear, mucus membranes moist Neck:supple, no JVD, no bruits  Heart:S1S2 RRR with 2/6 systolic murmur, no gallup, rub or click Lungs:clear without rales, rhonchi, or wheezes CNO:BSJG, non tender, + BS, do not palpate liver spleen or masses Ext:no lower ext edema, today 2+ pedal pulses, 2+ radial pulses Neuro:alert and oriented X 3, MAE, follows commands, + facial symmetry    EKG:  EKG is ordered today. The ekg ordered today demonstrates SR LAD, nonspecific intraventricular block. No changes from previous.   Recent Labs: No results found for requested labs within last 8760 hours.    Lipid Panel No results found for: CHOL, TRIG, HDL, CHOLHDL, VLDL, LDLCALC, LDLDIRECT     Other studies Reviewed: Additional studies/ records that were reviewed today include: . Echo  11/25/16 Study Conclusions  - Left ventricle: The cavity size was normal. There was moderate   concentric hypertrophy. Systolic function was normal. The   estimated ejection fraction was in the range of 55% to 60%. Wall   motion was normal; there were no regional wall motion   abnormalities. Features are consistent with a pseudonormal left   ventricular filling pattern, with concomitant abnormal relaxation   and increased filling pressure (grade 2 diastolic dysfunction).   Doppler parameters are consistent with high ventricular filling   pressure. - Aortic valve: Poorly visualized. A bioprosthesis was present and   functioning normally. Mean gradient (S): 17 mm Hg. - Mitral valve: Moderately calcified annulus. Mild diffuse   calcification of the anterior leaflet. There was mild   regurgitation. - Left atrium: The atrium was mildly dilated. 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 - Atrial septum: There was increased thickness of the septum,   consistent with lipomatous hypertrophy. - Tricuspid valve: There was trivial regurgitation.   ASSESSMENT AND  PLAN:  1.  AS with AVR stable on echo.  LV function is normal.    2.  CAD with hx CABG and stent.  Has chest pain once every 2 months and relief with NTG.  Instructed to call if increases.   3.  HLD, stable on last check with LDL 86 on no meds we discussed statinbut he prefers not to take.   4.  Chronic pain, seen in pain clinic - narcotics do decrease his energy, and may be adding to his depression.  He is hopefull that marijuana will be legal soon.   He is not suicidal but will not seek medical treatment for life threatening issues. He does see a therapist.  5.  HTN controlled    Current medicines are reviewed with the patient today.  The patient Has no concerns regarding medicines.  The following changes have been made:  See above Labs/ tests ordered today include:see above  Disposition:   FU:  see above  Signed, Cecilie Kicks, NP  01/14/2017 9:31 AM    [facility] [address], [place], [place], [place] Phone: [phone]; Fax: [phone]

## 2017-01-14 ENCOUNTER — Encounter: Payer: Self-pay | Admitting: Cardiology

## 2017-01-14 ENCOUNTER — Ambulatory Visit (INDEPENDENT_AMBULATORY_CARE_PROVIDER_SITE_OTHER): Payer: Federal, State, Local not specified - PPO | Admitting: Cardiology

## 2017-01-14 VITALS — BP 116/82 | HR 76 | Resp 16 | Ht 66.0 in | Wt 173.4 lb

## 2017-01-14 DIAGNOSIS — I5032 Chronic diastolic (congestive) heart failure: Secondary | ICD-10-CM

## 2017-01-14 DIAGNOSIS — G8929 Other chronic pain: Secondary | ICD-10-CM

## 2017-01-14 DIAGNOSIS — Z952 Presence of prosthetic heart valve: Secondary | ICD-10-CM | POA: Diagnosis not present

## 2017-01-14 DIAGNOSIS — I10 Essential (primary) hypertension: Secondary | ICD-10-CM | POA: Diagnosis not present

## 2017-01-14 DIAGNOSIS — E782 Mixed hyperlipidemia: Secondary | ICD-10-CM | POA: Diagnosis not present

## 2017-01-14 DIAGNOSIS — I25810 Atherosclerosis of coronary artery bypass graft(s) without angina pectoris: Secondary | ICD-10-CM | POA: Diagnosis not present

## 2017-01-14 NOTE — Patient Instructions (Addendum)
Medication Instructions:  Your physician recommends that you continue on your current medications as directed. Please refer to the Current Medication list given to you today.   Labwork: None ordered  Testing/Procedures: None ordered  Follow-Up: Your physician wants you to follow-up in: [place] will receive a reminder letter in the mail two months in advance. If you don't receive a letter, please call our office to schedule the follow-up appointment.   Any Other Special Instructions Will Be Listed Below (If Applicable).     If you need a refill on your cardiac medications before your next appointment, please call your pharmacy.

## 2017-01-18 ENCOUNTER — Other Ambulatory Visit: Payer: Self-pay | Admitting: *Deleted

## 2017-01-18 ENCOUNTER — Telehealth: Payer: Self-pay | Admitting: Interventional Cardiology

## 2017-01-18 MED ORDER — IRBESARTAN 150 MG PO TABS: 150.0000 mg | ORAL_TABLET | Freq: Every day | ORAL | 11 refills | Status: DC

## 2017-01-18 MED ORDER — IRBESARTAN 150 MG PO TABS: 150.0000 mg | ORAL_TABLET | Freq: Every day | ORAL | 3 refills | Status: DC

## 2017-01-18 NOTE — Addendum Note (Signed)
Addended by: Juventino Slovak on: 01/18/2017 10:10 AM   Modules accepted: Orders

## 2017-01-18 NOTE — Telephone Encounter (Signed)
New Message   *STAT* If patient is at the pharmacy, call can be transferred to refill team.   1. Which medications need to be refilled? (please list name of each medication and dose if known) Irbesartan 150mg    2. Which pharmacy/location (including street and city if local pharmacy) is medication to be sent to? [facility]  3. Do they need a 30 day or 90 day supply? [place]

## 2017-03-05 ENCOUNTER — Other Ambulatory Visit: Payer: Self-pay | Admitting: Internal Medicine

## 2017-03-05 ENCOUNTER — Other Ambulatory Visit: Payer: Self-pay | Admitting: Critical Care Medicine

## 2017-03-05 ENCOUNTER — Ambulatory Visit
Admission: RE | Admit: 2017-03-05 | Discharge: 2017-03-05 | Disposition: A | Discharge status: Home / Self Care | Payer: Federal, State, Local not specified - PPO | Source: Ambulatory Visit | Attending: Critical Care Medicine | Admitting: Critical Care Medicine

## 2017-03-05 DIAGNOSIS — M79605 Pain in left leg: Secondary | ICD-10-CM

## 2017-03-05 DIAGNOSIS — M25552 Pain in left hip: Secondary | ICD-10-CM

## 2017-03-19 ENCOUNTER — Other Ambulatory Visit: Payer: Self-pay | Admitting: Internal Medicine

## 2017-03-19 DIAGNOSIS — M5116 Intervertebral disc disorders with radiculopathy, lumbar region: Secondary | ICD-10-CM

## 2017-03-25 ENCOUNTER — Ambulatory Visit
Admission: RE | Admit: 2017-03-25 | Discharge: 2017-03-25 | Disposition: A | Discharge status: Home / Self Care | Payer: Federal, State, Local not specified - PPO | Source: Ambulatory Visit | Attending: Internal Medicine | Admitting: Internal Medicine

## 2017-03-25 DIAGNOSIS — M5116 Intervertebral disc disorders with radiculopathy, lumbar region: Secondary | ICD-10-CM

## 2017-04-11 ENCOUNTER — Other Ambulatory Visit: Payer: Self-pay | Admitting: Interventional Cardiology

## 2017-04-12 ENCOUNTER — Other Ambulatory Visit: Payer: Self-pay | Admitting: Interventional Cardiology

## 2017-04-12 MED ORDER — AMLODIPINE BESYLATE 5 MG PO TABS: 5.0000 mg | ORAL_TABLET | Freq: Every day | ORAL | 2 refills | Status: DC

## 2017-05-17 ENCOUNTER — Other Ambulatory Visit: Payer: Self-pay | Admitting: Interventional Cardiology

## 2017-06-10 ENCOUNTER — Encounter: Payer: Self-pay | Admitting: Interventional Cardiology

## 2017-06-17 ENCOUNTER — Telehealth: Payer: Self-pay | Admitting: Interventional Cardiology

## 2017-06-17 NOTE — Telephone Encounter (Signed)
New Message      [facility] Pre-operative Risk Assessment    Request for surgical clearance:  1. What type of surgery is being performed? Laparoscopic incision hernia with mesh  2. When is this surgery scheduled? 06/21/17  3. What type of clearance is required (medical clearance vs. Pharmacy clearance to hold med vs. Both)? medical  4. Are there any medications that need to be held prior to surgery and how long? no  5. Practice name and name of physician performing surgery? [facility] in [place] / Dr. Orrin Brigham  6. What is your office phone number [phone] ext [phone]   7.   What is your office fax number [phone]  8.   Anesthesia type (None, local, MAC, general) ? general   Max Byrd 06/17/2017, 2:05 PM  _________________________________________________________________   (provider comments below)

## 2017-06-17 NOTE — Telephone Encounter (Signed)
Dr. Thompson Caul input is noted. Await office evaluation prior to providing clearance given symptoms noted at office visit in 12/2016.

## 2017-06-17 NOTE — Telephone Encounter (Signed)
It is okay to hold aspirin 3 to 5 days prior to surgery and resume as soon as possible.  I really prefer that the patient remain on aspirin if possible.  Above is true only if the patient is asymptomatic, active, and having no cardiac complaints.

## 2017-06-17 NOTE — Telephone Encounter (Signed)
   Chart reviewed as part of pre-op coverage.   Primary cardiologist: Dr. Tamala Julian  62 year old male with CAD s/p 3-vessel CABG in 2014 (LIMA-LAD, VG-diag, SG-OM), s/p OM1 PCI in 07/2013 after VG-OM failed, bicuspid aortic valve s/p pericardial tissue AVR in 2014, HTN, HLD, chronic pain disorder, and depression who was most recently evaluated by Cecilie Kicks on 01/14/2017 for routine follow up with the patient noting intermittent chest pain ~ every 2 months.    1) Dr. Tamala Julian, please comment on holding Plavix for upcoming laparoscopic incision hernia with mesh and please route back to the pre-op pool.   2) Following input from Dr. Tamala Julian as above, pre-op staff please contact the patient for pre-op office visit given his noted chest pain at his last office visit.

## 2017-06-18 NOTE — Telephone Encounter (Signed)
Per Dr. Tamala Julian pt will need an appt before he can be cleared for his surgery. Pt is scheduled for surgery Monday 06/21/17. I have a lmom for surgery scheduler with [facility] in [place], Dr. Leitha Schuller office that pt is needing an appt per Dr. Daneen Schick before pt can be cleared. I then tried to reach the pt. Call would not go through for the home number. I then tried pt's cell and cell phone rang and rang. No VM came on.

## 2017-06-21 DIAGNOSIS — Z01818 Encounter for other preprocedural examination: Secondary | ICD-10-CM | POA: Diagnosis not present

## 2017-07-16 ENCOUNTER — Ambulatory Visit (INDEPENDENT_AMBULATORY_CARE_PROVIDER_SITE_OTHER): Payer: Federal, State, Local not specified - PPO | Admitting: Specialist

## 2017-07-16 ENCOUNTER — Ambulatory Visit (INDEPENDENT_AMBULATORY_CARE_PROVIDER_SITE_OTHER): Payer: Federal, State, Local not specified - PPO

## 2017-07-16 ENCOUNTER — Encounter (INDEPENDENT_AMBULATORY_CARE_PROVIDER_SITE_OTHER): Payer: Self-pay | Admitting: Specialist

## 2017-07-16 VITALS — BP 125/74 | HR 62 | Ht 66.0 in | Wt 161.0 lb

## 2017-07-16 DIAGNOSIS — M47816 Spondylosis without myelopathy or radiculopathy, lumbar region: Secondary | ICD-10-CM

## 2017-07-16 DIAGNOSIS — M4316 Spondylolisthesis, lumbar region: Secondary | ICD-10-CM | POA: Diagnosis not present

## 2017-07-16 DIAGNOSIS — M545 Low back pain: Secondary | ICD-10-CM

## 2017-07-16 NOTE — Patient Instructions (Signed)
Avoid bending, stooping and avoid lifting weights greater than 10 lbs. Avoid prolong standing and walking.  Surgery recommended is a one level lumbar fusion L5-S1this would be done with rods, screws and cages with local bone graft and allograft (donor bone graft). Takeoxycodone for for pain. Risk of surgery includes risk of infection 1 in 300 patients, bleeding 1/2% chance you would need a transfusion.   Risk to the nerves is one in 10,000. You will need to use a brace for 3 months and wean from the brace on the 4th month. Expect improved walking and standing tolerance. Expect relief of leg pain but numbness may persist depending on the length and degree of pressure that has been present.

## 2017-07-16 NOTE — Progress Notes (Signed)
Office Visit Note   Patient: Max Byrd           Date of Birth: 02/21/1955           MRN: [id number] Visit Date: 07/16/2017              Requested by: Wenda Low, MD [place] [employer] [address], [place] [address] PCP: Wenda Low, MD   Assessment & Plan: Visit Diagnoses:  1. Low back pain, unspecified back pain laterality, unspecified chronicity, with sciatica presence unspecified   2. Spondylolisthesis, lumbar region   3. Spondylosis without myelopathy or radiculopathy, lumbar region     Plan:   Avoid bending, stooping and avoid lifting weights greater than 10 lbs. Avoid prolong standing and walking.  Surgery recommended is a one level lumbar fusion L5-S1this would be done with rods, screws and cages with local bone graft and allograft (donor bone graft). Take oxycodone for for pain. Risk of surgery includes risk of infection 1 in 300 patients, bleeding 1/2% chance you would need a transfusion.   Risk to the nerves is one in 10,000. You will need to use a brace for 3 months and wean from the brace on the 4th month. Expect improved walking and standing tolerance. Expect relief of leg pain but numbness may persist depending on the length and degree of pressure that has been present. Follow-Up Instructions: Return if symptoms worsen or fail to improve.   Orders:  Orders Placed This Encounter  Procedures  . XR Lumb Spine Flex&Ext Only   No orders of the defined types were placed in this encounter.     Procedures: No procedures performed   Clinical Data: Findings:  MRI with biforamenal narrowing left greater than right L5 neuroforamen. Grade one slip L5-S1.     Subjective: Chief Complaint  Patient presents with  . Lower Back - Pain    62 year old male with a 5 month history of increase back and bilateral leg pain left greater than right. Reports that he awoke with pain and it has worsened with pain in any position. He was been seen and evaluated by  Physiatry Dr. Brien Few and has undergone 3 ESIs for spinal stenosis. He has a significant cardiac history with history of CABG and Aortic valve replacement. He reports pain that prevents standing or walking for more than 3-4 minutes. Some improvement with sitting but pain is not not improving even with sitting. He has tried gabapentin, ESIs and reports that his pain is primarily over the left anterolateral thigh and the left anterior shin. He has undergone MRI scan and is being seen today for evaluation concerning his next option for treatment. No bowel or bladder difficutly. Walking tolerance less than 200 feet. Unable to grocery shop, he does lean of the cart. Weak in climbing stairs.    Review of Systems  Constitutional: Positive for activity change. Negative for appetite change, chills, diaphoresis, fatigue, fever and unexpected weight change.  HENT: Negative.   Eyes: Negative.   Respiratory: Negative.   Cardiovascular: Negative.   Gastrointestinal: Negative.   Endocrine: Negative.   Genitourinary: Negative.   Musculoskeletal: Positive for back pain and gait problem. Negative for arthralgias, joint swelling, myalgias, neck pain and neck stiffness.  Skin: Negative for color change, pallor, rash and wound.  Allergic/Immunologic: Negative for environmental allergies, food allergies and immunocompromised state.  Neurological: Positive for numbness. Negative for dizziness, tremors, seizures, syncope, facial asymmetry, speech difficulty, weakness, light-headedness and headaches.  Hematological: Negative  for adenopathy. Does not bruise/bleed easily.  Psychiatric/Behavioral: Positive for decreased concentration and sleep disturbance. Negative for agitation, behavioral problems, confusion, dysphoric mood, hallucinations, self-injury and suicidal ideas. The patient is not nervous/anxious and is not hyperactive.      Objective: Vital Signs: BP 125/74 (BP Location: Left Arm, Patient Position: Sitting)    Pulse 62   Ht 5\' 6"  (1.676 m)   Wt 161 lb (73 kg)   BMI 25.99 kg/m   Physical Exam  Constitutional: He is oriented to person, place, and time. He appears well-developed and well-nourished.  HENT:  Head: Normocephalic and atraumatic.  Eyes: Pupils are equal, round, and reactive to light. EOM are normal.  Neck: Normal range of motion. Neck supple.  Pulmonary/Chest: Effort normal and breath sounds normal.  Abdominal: Soft. Bowel sounds are normal.  Neurological: He is alert and oriented to person, place, and time.  Skin: Skin is warm and dry.  Psychiatric: He has a normal mood and affect. His behavior is normal. Judgment and thought content normal.    Back Exam   Tenderness  The patient is experiencing tenderness in the lumbar.  Range of Motion  Extension:  10 abnormal  Flexion:  70 abnormal  Lateral bend right: abnormal  Lateral bend left: abnormal  Rotation right: abnormal  Rotation left: abnormal   Muscle Strength  Right Quadriceps:  5/5  Right Hamstrings:  5/5   Tests  Straight leg raise right: negative Straight leg raise left: negative  Reflexes  Patellar: normal Achilles: normal Biceps: normal Babinski's sign: normal   Other  Toe walk: normal Heel walk: abnormal Sensation: normal Gait: antalgic  Erythema: no back redness Scars: absent      Specialty Comments:  No specialty comments available.  Imaging: No results found.   PMFS History: Patient Active Problem List   Diagnosis Date Noted  . Thrombocytopenia ([place]) 03/08/2014  . Chronic diastolic heart failure ([place]) 11/15/2013  . Other and unspecified angina pectoris 08/10/2013  . S/P AVR (aortic valve replacement) 04/18/2013  . Chronic kidney disease   . Erectile dysfunction- Viagra discontinued as Imdur added   . Cancer ([place])   . H/O Bell's palsy   . Depression   . Pain, chronic postoperative   . IBS (irritable bowel syndrome)   . Hypertension   . Hyperlipidemia   . CAD (coronary  artery disease) of artery bypass graft-successful PCI SVG-OM 08/10/13    Past Medical History:  Diagnosis Date  . Anginal pain ([place]) 2015   "since valve was done"  . Anxiety   . Arthritis    "left pointer finger; forminal openings bilaterally" (08/10/2013)  . Bicuspid aortic valve    a. 01/2013 s/p AVR - 46mm Edwards 3300 TFX pericardial tissue valve, ser # [id number].  Marland Kitchen CKD (chronic kidney disease) stage 2, GFR 60-89 ml/min   . Coronary atherosclerosis of native coronary artery    a. 01/2013 CABGx3: LIMA->LAD, VG->Diag, VG->OM (performed @ time of AVR),  occluded SVG-OM2 , now with 95% stenosis at the anastomosis site on OM 2 - PCI attempt 07/28/13 unsuccessful - med Rx cont'd   . Depression   . Erectile dysfunction    INJECTIONS OF PROSTAGLANDIN BY UROLGIST  . Essential hypertension, benign   . H/O Bell's palsy 2010 X2  . Heart murmur   . History of blood transfusion    "related to stomach bleeding from ASA & NSAIDS  . History of stomach ulcers   . Hyperlipidemia   . IBS (irritable bowel syndrome)   .  Migraine    "went away in the 1990's"  . Pain, chronic postoperative    S/P LEFT ARM SURGERY  . Renal cell carcinoma of right kidney ([place]) 2003    Family History  Problem Relation Age of Onset  . Cancer Mother        BREAST/OVARIAN  . Heart disease Father        S/P MI/CABG    Past Surgical History:  Procedure Laterality Date  . ANAL SPHINCTEROTOMY  04/13/03   DR. GRAPEY  . AORTIC VALVE REPLACEMENT N/A 02/14/2013   Procedure: AORTIC VALVE REPLACEMENT (AVR);  Surgeon: Ivin Poot, MD;  Location: [facility];  Service: Open Heart Surgery;  Laterality: N/A;  . CARDIAC CATHETERIZATION  2010; 6/12015; 07/29/2013  . CARDIAC VALVE REPLACEMENT    . COLONOSCOPY  2008   DIVERTICULOSIS  . CORONARY ANGIOPLASTY WITH STENT PLACEMENT  08/10/2013   "1"  . CORONARY ARTERY BYPASS GRAFT N/A 02/14/2013   Procedure: CORONARY ARTERY BYPASS GRAFTING (CABG);  Surgeon: Ivin Poot, MD;  [facility];  Service: Open Heart Surgery;  Laterality: N/A;  . ELBOW SURGERY Left X 2  . ELBOW SURGERY Right X 1  . EXCISIONAL HEMORRHOIDECTOMY    . HERNIA REPAIR Left 1957  . INTRAOPERATIVE TRANSESOPHAGEAL ECHOCARDIOGRAM N/A 02/14/2013   Procedure: INTRAOPERATIVE TRANSESOPHAGEAL ECHOCARDIOGRAM;  Surgeon: Ivin Poot, MD;  Location: [facility];  Service: Open Heart Surgery;  Laterality: N/A;  . LATERAL EPICONDYLE RELEASE Left 07/29/07   DR. GRAVES  . LEFT AND RIGHT HEART CATHETERIZATION WITH CORONARY ANGIOGRAM N/A 01/27/2013   Procedure: LEFT AND RIGHT HEART CATHETERIZATION WITH CORONARY ANGIOGRAM;  Surgeon: Sinclair Grooms, MD;  [facility] LAB;  Service: Cardiovascular;  Laterality: N/A;  . LEFT HEART CATHETERIZATION WITH CORONARY/GRAFT ANGIOGRAM N/A 07/28/2013   Procedure: LEFT HEART CATHETERIZATION WITH Beatrix Fetters;  Surgeon: Leonie Man, MD;  Location: [facility] CATH LAB;  Service: Cardiovascular;  Laterality: N/A;  . PARTIAL NEPHRECTOMY  08/24/02   RIGHT...DR. Risa Grill  . PERCUTANEOUS STENT INTERVENTION N/A 08/10/2013   Procedure: PERCUTANEOUS STENT INTERVENTION;  Surgeon: Sinclair Grooms, MD;  Location: [facility] CATH LAB;  Service: Cardiovascular;  Laterality: N/A;  . TOE SURGERY Right    "shaved; wired back straight"  . TONSILLECTOMY     Social History   Occupational History  . Not on file  Tobacco Use  . Smoking status: Former Smoker    Packs/day: 1.50    Years: 6.00    Pack years: 9.00    Types: Cigarettes    Start date: 10/06/1969    Last attempt to quit: 01/21/1976    Years since quitting: 41.5  . Smokeless tobacco: Current User    Types: Snuff  . Tobacco comment: quit  36 years ago  Substance and Sexual Activity  . Alcohol use: Yes    Alcohol/week: 0.0 oz    Comment: "last beer was 01/2013"  . Drug use: No  . Sexual activity: Yes

## 2017-07-19 ENCOUNTER — Encounter (INDEPENDENT_AMBULATORY_CARE_PROVIDER_SITE_OTHER): Payer: Self-pay | Admitting: Specialist

## 2017-08-09 ENCOUNTER — Telehealth (INDEPENDENT_AMBULATORY_CARE_PROVIDER_SITE_OTHER): Payer: Self-pay | Admitting: Specialist

## 2017-08-09 ENCOUNTER — Telehealth: Payer: Self-pay | Admitting: Interventional Cardiology

## 2017-08-09 NOTE — Telephone Encounter (Signed)
° °  [facility] Pre-operative Risk Assessment    Request for surgical clearance:  1. What type of surgery is being performed? Lumbar fusion  2. When is this surgery scheduled? TBD  3. What type of clearance is required (medical clearance vs. Pharmacy clearance to hold med vs. Both)? Both   4. Are there any medications that need to be held prior to surgery and how long? Please advise any medications that need to be held and how long.   5. Practice name and name of physician performing surgery?  [facility], Dr. Basil Dess  6. What is your office phone number [phone], ATTN: Sherrie   7.   What is your office fax number [phone]  8.   Anesthesia type (None, local, MAC, general) ? Not listed   Derl Barrow 08/09/2017, 4:02 PM  _________________________________________________________________   (provider comments below)

## 2017-08-09 NOTE — Telephone Encounter (Signed)
   Primary Cardiologist:Henry Nicholes Stairs III, MD  Chart reviewed as part of pre-operative protocol coverage. Because of Max Byrd's past medical history and time since last visit, he will require a follow-up visit in order to better assess preoperative cardiovascular risk.  Pre-op covering staff: - Please schedule appointment with Dr. Tamala Julian or care team and call patient to inform them. - Please contact requesting surgeon's office via preferred method (i.e, phone, fax) to inform them of need for appointment prior to surgery. - Route note to provider seeing the patient as the final recommendations regarding surgical risk will be made by this provider. - Note will be removed from preop pool.   Richardson Dopp, PA-C  08/09/2017, 5:37 PM

## 2017-08-09 NOTE — Telephone Encounter (Signed)
I called patient and discussed.  Need to get order from Dr. Louanne Skye.  Patient is also going for another opinion with Dr. Annette Stable on Wednesday.  Patient will call me back to let me know how he wants to proceed.

## 2017-08-09 NOTE — Telephone Encounter (Signed)
Patient needs to schedule surgery with Dr. Louanne Skye said he is in a lot of pain, would like a surgery as soon as possible. Please give patient a call back # [phone]

## 2017-08-10 NOTE — Telephone Encounter (Signed)
Spoke with pt re: surgical clearance.  Pt has been scheduled to see Cecilie Kicks, NP 08/17/17.

## 2017-08-16 NOTE — Progress Notes (Signed)
Cardiology Office Note   Date:  08/17/2017   ID:  Max Byrd, DOB January 25, 1956, MRN [id number]  PCP:  Wenda Low, MD  Cardiologist:  Dr. Tamala Julian    Chief Complaint  Patient presents with  . Pre-op Exam      History of Present Illness: Max Byrd is a 62 y.o. male who presents for Pre-op Clearance for lumbar fusion by Dr. Deri Fuelling at [facility].   CAD and AVR in 2014.     He has chronic kidney disease, bicuspid aortic valve treated with pericardial tissue valve2014, hypertension, CAD with hx angina, after CABG, rec'd circumflex stent after failed saphenous vein graftto 2014, and depression.  Wt lifting has been discouraged but he does use moderate weights..    Echo 11/2016 with EF 55-60% the bioprosthetic aortic valve was functioning normally.  G2DD.   On last visit he has occ chest pain relieved by 2 NTG.  This may happen once every 2 months, some SOB.    Today he seems brighter than last visit.  He denies chest pain with need for NTG.  He is able to walk up 2 flights of steps without chest pain though may have SOB which is chronic.  He had echo for his valve in 11/2016 and was stable.  He is anxious to have surgery due to his pain.  He did undergo abd. Hernia  At [facility].  He did well.    Past Medical History:  Diagnosis Date  . Anginal pain ([place]) 2015   "since valve was done"  . Anxiety   . Arthritis    "left pointer finger; forminal openings bilaterally" (08/10/2013)  . Bicuspid aortic valve    a. 01/2013 s/p AVR - 63mm Edwards 3300 TFX pericardial tissue valve, ser # [id number].  Marland Kitchen CKD (chronic kidney disease) stage 2, GFR 60-89 ml/min   . Coronary atherosclerosis of native coronary artery    a. 01/2013 CABGx3: LIMA->LAD, VG->Diag, VG->OM (performed @ time of AVR),  occluded SVG-OM2 , now with 95% stenosis at the anastomosis site on OM 2 - PCI attempt 07/28/13 unsuccessful - med Rx cont'd   . Depression   . Erectile dysfunction    INJECTIONS OF PROSTAGLANDIN BY UROLGIST  . Essential hypertension, benign   . H/O Bell's palsy 2010 X2  . Heart murmur   . History of blood transfusion    "related to stomach bleeding from ASA & NSAIDS  . History of stomach ulcers   . Hyperlipidemia   . IBS (irritable bowel syndrome)   . Migraine    "went away in the 1990's"  . Pain, chronic postoperative    S/P LEFT ARM SURGERY  . Renal cell carcinoma of right kidney ([place]) 2003    Past Surgical History:  Procedure Laterality Date  . ANAL SPHINCTEROTOMY  04/13/03   DR. GRAPEY  . AORTIC VALVE REPLACEMENT N/A 02/14/2013   Procedure: AORTIC VALVE REPLACEMENT (AVR);  Surgeon: Ivin Poot, MD;  Location: [facility];  Service: Open Heart Surgery;  Laterality: N/A;  . CARDIAC CATHETERIZATION  2010; 6/12015; 07/29/2013  . CARDIAC VALVE REPLACEMENT    . COLONOSCOPY  2008   DIVERTICULOSIS  . CORONARY ANGIOPLASTY WITH STENT PLACEMENT  08/10/2013   "1"  . CORONARY ARTERY BYPASS GRAFT N/A 02/14/2013   Procedure: CORONARY ARTERY BYPASS GRAFTING (CABG);  Surgeon: Ivin Poot, MD;  Location: [facility];  Service: Open Heart Surgery;  Laterality: N/A;  . ELBOW SURGERY Left X 2  .  ELBOW SURGERY Right X 1  . EXCISIONAL HEMORRHOIDECTOMY    . HERNIA REPAIR Left 1957  . INTRAOPERATIVE TRANSESOPHAGEAL ECHOCARDIOGRAM N/A 02/14/2013   Procedure: INTRAOPERATIVE TRANSESOPHAGEAL ECHOCARDIOGRAM;  Surgeon: Ivin Poot, MD;  Location: [facility];  Service: Open Heart Surgery;  Laterality: N/A;  . LATERAL EPICONDYLE RELEASE Left 07/29/07   DR. GRAVES  . LEFT AND RIGHT HEART CATHETERIZATION WITH CORONARY ANGIOGRAM N/A 01/27/2013   Procedure: LEFT AND RIGHT HEART CATHETERIZATION WITH CORONARY ANGIOGRAM;  Surgeon: Sinclair Grooms, MD;  Location: [facility] CATH LAB;  Service: Cardiovascular;  Laterality: N/A;  . LEFT HEART CATHETERIZATION WITH CORONARY/GRAFT ANGIOGRAM N/A 07/28/2013   Procedure: LEFT HEART CATHETERIZATION WITH Beatrix Fetters;  Surgeon: Leonie Man, MD;  Location: [facility] CATH LAB;  Service: Cardiovascular;  Laterality: N/A;  . PARTIAL NEPHRECTOMY  08/24/02   RIGHT...DR. Risa Grill  . PERCUTANEOUS STENT INTERVENTION N/A 08/10/2013   Procedure: PERCUTANEOUS STENT INTERVENTION;  Surgeon: Sinclair Grooms, MD;  Location[facility] CATH LAB;  Service: Cardiovascular;  Laterality: N/A;  . TOE SURGERY Right    "shaved; wired back straight"  . TONSILLECTOMY       Current Outpatient Medications  Medication Sig Dispense Refill  . ALPRAZolam (XANAX) 1 MG tablet Take 1 mg by mouth 4 (four) times daily.    Marland Kitchen amLODipine (NORVASC) 5 MG tablet Take 1 tablet (5 mg total) by mouth daily. 90 tablet 2  . aspirin EC 81 MG EC tablet Take 1 tablet (81 mg total) by mouth daily.    . carisoprodol (SOMA) 350 MG tablet Take 350 mg by mouth 2 (two) times daily.    . Diclofenac-Misoprostol (ARTHROTEC) 50-0.2 MG TBEC Take 1 tablet by mouth 2 (two) times daily.    . DULoxetine (CYMBALTA) 60 MG capsule Take 60 mg by mouth 2 (two) times daily.     . furosemide (LASIX) 40 MG tablet TAKE ONE TABLET BY MOUTH ONCE DAILY 30 tablet 10  . hyoscyamine (LEVSIN SL) 0.125 MG SL tablet Take 0.125 mg by mouth every 4 (four) hours as needed (IBS).     Marland Kitchen irbesartan (AVAPRO) 150 MG tablet Take 1 tablet (150 mg total) by mouth daily. 90 tablet 3  . nitroGLYCERIN (NITROSTAT) 0.4 MG SL tablet Place 1 tablet (0.4 mg total) under the tongue every 5 (five) minutes as needed for chest pain (MAX 3 TABLETS). 25 tablet 0  . oxyCODONE-acetaminophen (PERCOCET) 10-325 MG tablet Take 1 tablet by mouth every 8 (eight) hours as needed for pain.    . OXYCONTIN 30 MG T12A Take 30 mg by mouth every 12 (twelve) hours.     . sertraline (ZOLOFT) 100 MG tablet Take 200 mg by mouth every morning.     . testosterone cypionate (DEPOTESTOSTERONE CYPIONATE) 200 MG/ML injection INJECT ONE MILLILITER IN THE MUSCLE EVERY TWO WEEKS  3  . zolpidem (AMBIEN) 10 MG tablet Take 10 mg by mouth daily.  5   No current  facility-administered medications for this visit.     Allergies:   Aspirin; Buspirone; Nsaids; Tolmetin; Zoloft [sertraline hcl]; Klonopin [clonazepam]; Beta adrenergic blockers; and Reglan [metoclopramide]    Social History:  The patient  reports that he quit smoking about 41 years ago. His smoking use included cigarettes. He started smoking about 47 years ago. He has a 9.00 pack-year smoking history. His smokeless tobacco use includes snuff. He reports that he drinks alcohol. He reports that he does not use drugs.   Family History:  The patient's family history  includes Cancer in his mother; Heart disease in his father.    ROS:  General:no colds or fevers, no weight changes Skin:no rashes or ulcers HEENT:no blurred vision, no congestion CV:see HPI PUL:see HPI GI:no diarrhea constipation or melena, no indigestion GU:no hematuria, no dysuria MS:no joint pain, no claudication Neuro:no syncope, no lightheadedness Endo:no diabetes, no thyroid disease  Wt Readings from Last 3 Encounters:  08/17/17 166 lb 12.8 oz (75.7 kg)  07/16/17 161 lb (73 kg)  01/14/17 173 lb 6.4 oz (78.7 kg)     PHYSICAL EXAM: VS:  BP 118/72   Pulse 78   Ht 5\' 6"  (1.676 m)   Wt 166 lb 12.8 oz (75.7 kg)   BMI 26.92 kg/m  , BMI Body mass index is 26.92 kg/m. General:Pleasant affect, NAD Skin:Warm and dry, brisk capillary refill HEENT:normocephalic, sclera clear, mucus membranes moist Neck:supple, no JVD, no bruits  Heart:S1S2 RRR with 2/6 systolic murmur, no gallup, rub or click Lungs:clear without rales, rhonchi, or wheezes OIN:OMVE, non tender, + BS, do not palpate liver spleen or masses Ext:no lower ext edema, 2+ pedal pulses, 2+ radial pulses Neuro:alert and oriented X 3, MAE, follows commands, + facial symmetry    EKG:  EKG is ordered today. The ekg ordered today demonstrates SR with LBBB no changes.   Recent Labs: No results found for requested labs within last 8760 hours.    Lipid  Panel No results found for: CHOL, TRIG, HDL, CHOLHDL, VLDL, LDLCALC, LDLDIRECT     Other studies Reviewed: Additional studies/ records that were reviewed today include: . Echo 11/25/16  Study Conclusions  - Left ventricle: The cavity size was normal. There was moderate   concentric hypertrophy. Systolic function was normal. The   estimated ejection fraction was in the range of 55% to 60%. Wall   motion was normal; there were no regional wall motion   abnormalities. Features are consistent with a pseudonormal left   ventricular filling pattern, with concomitant abnormal relaxation   and increased filling pressure (grade 2 diastolic dysfunction).   Doppler parameters are consistent with high ventricular filling   pressure. - Aortic valve: Poorly visualized. A bioprosthesis was present and   functioning normally. Mean gradient (S): 17 mm Hg. - Mitral valve: Moderately calcified annulus. Mild diffuse   calcification of the anterior leaflet. There was mild   regurgitation. - Left atrium: The atrium was mildly dilated. 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 - Atrial septum: There was increased thickness of the septum,   consistent with lipomatous hypertrophy. - Tricuspid valve: There was trivial regurgitation.  ASSESSMENT AND PLAN:  1.  Pre-op exam for spina fusion by Dr. Amado Coe.  Pt is 0.9% risk for cardiac complication.  No angina and is able to meet 4 METS without angina.  His AVR with pericardial tissue valve is stable.  It will be OK for him to be off ASA for 3-5 days for surgery.  He should resume post op.    2.  CAD with CABG and Hx of cardiac cath and PCI 08/10/13 to the 1st OM with reduction of stenosis -was found to have occluded VG to OM on cath.  On ASA 81 mg daily.   3.  Post AVR with pericardial tissue valve in 2014  21 mm [facility] Ease Pericardial Tissue Valve  4.   HLD per PCP last LDL in 11/2016 was 86, pt has refused  statins.   5.   HTN  controlled  6.   Chronic pain, followed at pain clinic and now with severe hip and leg pain.      Current medicines are reviewed with the patient today.  The patient Has no concerns regarding medicines.  The following changes have been made:  See above Labs/ tests ordered today include:see above  Disposition:   FU:  see above  Signed, Cecilie Kicks, NP  08/17/2017 12:27 PM    [facility] [place], [place], [address] [place], [place] Phone: [phone]; Fax: [phone]

## 2017-08-17 ENCOUNTER — Ambulatory Visit (INDEPENDENT_AMBULATORY_CARE_PROVIDER_SITE_OTHER): Payer: Federal, State, Local not specified - PPO | Admitting: Cardiology

## 2017-08-17 ENCOUNTER — Encounter: Payer: Self-pay | Admitting: Cardiology

## 2017-08-17 VITALS — BP 118/72 | HR 78 | Ht 66.0 in | Wt 166.8 lb

## 2017-08-17 DIAGNOSIS — Z952 Presence of prosthetic heart valve: Secondary | ICD-10-CM | POA: Diagnosis not present

## 2017-08-17 DIAGNOSIS — E782 Mixed hyperlipidemia: Secondary | ICD-10-CM

## 2017-08-17 DIAGNOSIS — I25810 Atherosclerosis of coronary artery bypass graft(s) without angina pectoris: Secondary | ICD-10-CM | POA: Diagnosis not present

## 2017-08-17 DIAGNOSIS — I10 Essential (primary) hypertension: Secondary | ICD-10-CM

## 2017-08-17 NOTE — Telephone Encounter (Signed)
Pt seen by Cecilie Kicks, NP today and reported that Dr. Annette Stable will be doing surgery instead of [facility]. Mickel Baas will send clearance note to Dr. Annette Stable.

## 2017-08-17 NOTE — Patient Instructions (Signed)
Medication Instructions:  Your physician recommends that you continue on your current medications as directed. Please refer to the Current Medication list given to you today.   Labwork: None ordered  Testing/Procedures: None ordered  Follow-Up: Your physician wants you to follow-up in: 5 months with Dr. Tamala Julian.  Any Other Special Instructions Will Be Listed Below (If Applicable).     If you need a refill on your cardiac medications before your next appointment, please call your pharmacy.

## 2017-08-18 ENCOUNTER — Other Ambulatory Visit: Payer: Self-pay | Admitting: Neurosurgery

## 2017-08-31 NOTE — Pre-Procedure Instructions (Signed)
DARRLY LOBERG  08/31/2017      [place], [place], [place] [address] Phone: [phone] Fax: [phone]    Your procedure is scheduled on Mon., September 06, 2017 from 12:25PM-3:07PM  Report to [facility] Admitting Entrance "A" at 10:20AM  Call this number if you have problems the morning of surgery:  [phone]   Remember:  Do not eat or drink after midnight on July 21st    Take these medicines the morning of surgery with A SIP OF WATER: ALPRAZolam (XANAX), AmLODipine (NORVASC), DULoxetine (CYMBALTA), Sertraline (ZOLOFT), and Naphazoline HCl (CLEAR EYES OP) If needed Carisoprodol (SOMA), Loperamide (IMODIUM A-D), OxyCODONE-acetaminophen (PERCOCET), and NitroGLYCERIN (NITROSTAT) (Notify the nurse if you had to take this medicine).  As of today, stop taking all Other Aspirin Products, Vitamins, Fish oils, and Herbal medications. Also stop all NSAIDS i.e. Advil, Ibuprofen, Motrin, Aleve, Anaprox, Naproxen, BC, Goody Powders, and all Supplements. Including: Diclofenac-Misoprostol (ARTHROTEC)    Do not wear jewelry.  Do not wear lotions, powders, colognes, or deodorant.  Do not shave 48 hours prior to surgery.  Men may shave face.  Do not bring valuables to the hospital.  [facility] is not responsible for any belongings or valuables.  Contacts, dentures or bridgework may not be worn into surgery.  Leave your suitcase in the car.  After surgery it may be brought to your room.  For patients admitted to the hospital, discharge time will be determined by your treatment team.  Patients discharged the day of surgery will not be allowed to drive home.   Special instructions:   [place]- Preparing For Surgery  Before surgery, you can play an important role. Because skin is not sterile, your skin needs to be as free of germs as possible. You can reduce the number of germs on your skin by washing with CHG  (chlorahexidine gluconate) Soap before surgery.  CHG is an antiseptic cleaner which kills germs and bonds with the skin to continue killing germs even after washing.    Oral Hygiene is also important to reduce your risk of infection.  Remember - BRUSH YOUR TEETH THE MORNING OF SURGERY WITH YOUR REGULAR TOOTHPASTE  Please do not use if you have an allergy to CHG or antibacterial soaps. If your skin becomes reddened/irritated stop using the CHG.  Do not shave (including legs and underarms) for at least 48 hours prior to first CHG shower. It is OK to shave your face.  Please follow these instructions carefully.   1. Shower the NIGHT BEFORE SURGERY and the MORNING OF SURGERY with CHG.   2. If you chose to wash your hair, wash your hair first as usual with your normal shampoo.  3. After you shampoo, rinse your hair and body thoroughly to remove the shampoo.  4. Use CHG as you would any other liquid soap. You can apply CHG directly to the skin and wash gently with a scrungie or a clean washcloth.   5. Apply the CHG Soap to your body ONLY FROM THE NECK DOWN.  Do not use on open wounds or open sores. Avoid contact with your eyes, ears, mouth and genitals (private parts). Wash Face and genitals (private parts)  with your normal soap.  6. Wash thoroughly, paying special attention to the area where your surgery will be performed.  7. Thoroughly rinse your body with warm water from the neck down.  8.  DO NOT shower/wash with your normal soap after using and rinsing off the CHG Soap.  9. Pat yourself dry with a CLEAN TOWEL.  10. Wear CLEAN PAJAMAS to bed the night before surgery, wear comfortable clothes the morning of surgery  11. Place CLEAN SHEETS on your bed the night of your first shower and DO NOT SLEEP WITH PETS.  Day of Surgery:  Do not apply any deodorants/lotions.  Please wear clean clothes to the hospital/surgery center.   Remember to brush your teeth WITH YOUR REGULAR  TOOTHPASTE.  Please read over the following fact sheets that you were given. Pain Booklet, Coughing and Deep Breathing, MRSA Information and Surgical Site Infection Prevention

## 2017-09-01 ENCOUNTER — Encounter (HOSPITAL_COMMUNITY): Payer: Self-pay

## 2017-09-01 ENCOUNTER — Other Ambulatory Visit: Payer: Self-pay

## 2017-09-01 ENCOUNTER — Encounter (HOSPITAL_COMMUNITY)
Admission: RE | Admit: 2017-09-01 | Discharge: 2017-09-01 | Disposition: A | Discharge status: Home / Self Care | Payer: Federal, State, Local not specified - PPO | Source: Ambulatory Visit | Attending: Neurosurgery | Admitting: Neurosurgery

## 2017-09-01 DIAGNOSIS — F418 Other specified anxiety disorders: Secondary | ICD-10-CM | POA: Diagnosis not present

## 2017-09-01 DIAGNOSIS — Z7982 Long term (current) use of aspirin: Secondary | ICD-10-CM | POA: Diagnosis not present

## 2017-09-01 DIAGNOSIS — N182 Chronic kidney disease, stage 2 (mild): Secondary | ICD-10-CM | POA: Insufficient documentation

## 2017-09-01 DIAGNOSIS — Z87891 Personal history of nicotine dependence: Secondary | ICD-10-CM | POA: Diagnosis not present

## 2017-09-01 DIAGNOSIS — Z905 Acquired absence of kidney: Secondary | ICD-10-CM | POA: Diagnosis not present

## 2017-09-01 DIAGNOSIS — Z9889 Other specified postprocedural states: Secondary | ICD-10-CM | POA: Insufficient documentation

## 2017-09-01 DIAGNOSIS — I129 Hypertensive chronic kidney disease with stage 1 through stage 4 chronic kidney disease, or unspecified chronic kidney disease: Secondary | ICD-10-CM | POA: Diagnosis not present

## 2017-09-01 DIAGNOSIS — Z79899 Other long term (current) drug therapy: Secondary | ICD-10-CM | POA: Diagnosis not present

## 2017-09-01 DIAGNOSIS — Z01818 Encounter for other preprocedural examination: Secondary | ICD-10-CM | POA: Diagnosis present

## 2017-09-01 DIAGNOSIS — Z79891 Long term (current) use of opiate analgesic: Secondary | ICD-10-CM | POA: Insufficient documentation

## 2017-09-01 DIAGNOSIS — K219 Gastro-esophageal reflux disease without esophagitis: Secondary | ICD-10-CM | POA: Insufficient documentation

## 2017-09-01 HISTORY — DX: Gastro-esophageal reflux disease without esophagitis: K21.9

## 2017-09-01 LAB — CBC WITH DIFFERENTIAL/PLATELET
Abs Immature Granulocytes: 0.1 10*3/uL (ref 0.0–0.1)
Basophils Absolute: 0 10*3/uL (ref 0.0–0.1)
Basophils Relative: 0 %
EOS PCT: 3 %
Eosinophils Absolute: 0.2 10*3/uL (ref 0.0–0.7)
HEMATOCRIT: 46.4 % (ref 39.0–52.0)
Hemoglobin: 16.4 g/dL (ref 13.0–17.0)
Immature Granulocytes: 2 %
LYMPHS ABS: 1 10*3/uL (ref 0.7–4.0)
LYMPHS PCT: 14 %
MCH: 33.7 pg (ref 26.0–34.0)
MCHC: 35.3 g/dL (ref 30.0–36.0)
MCV: 95.3 fL (ref 78.0–100.0)
MONO ABS: 0.4 10*3/uL (ref 0.1–1.0)
Monocytes Relative: 5 %
Neutro Abs: 5.6 10*3/uL (ref 1.7–7.7)
Neutrophils Relative %: 76 %
Platelets: 129 10*3/uL — ABNORMAL LOW (ref 150–400)
RBC: 4.87 MIL/uL (ref 4.22–5.81)
RDW: 11.8 % (ref 11.5–15.5)
WBC: 7.3 10*3/uL (ref 4.0–10.5)

## 2017-09-01 LAB — BASIC METABOLIC PANEL
Anion gap: 9 (ref 5–15)
BUN: 27 mg/dL — AB (ref 8–23)
CO2: 26 mmol/L (ref 22–32)
Calcium: 8.9 mg/dL (ref 8.9–10.3)
Chloride: 105 mmol/L (ref 98–111)
Creatinine, Ser: 1.03 mg/dL (ref 0.61–1.24)
GFR calc non Af Amer: >60 mL/min (ref >60)
Glucose, Bld: 78 mg/dL (ref 70–99)
POTASSIUM: 4 mmol/L (ref 3.5–5.1)
Sodium: 140 mmol/L (ref 135–145)

## 2017-09-01 LAB — SURGICAL PCR SCREEN
MRSA, PCR: NEGATIVE
Staphylococcus aureus: NEGATIVE

## 2017-09-02 NOTE — Progress Notes (Signed)
Anesthesia Chart Review:  Case:  [id number] Date/Time:  09/06/17 1210   Procedure:  PLIF - L5-S1 (N/A Back)   Anesthesia type:  General   Pre-op diagnosis:  Spondylolisthesis   Location:  [facility] OR ROOM 68 / [facility] OR   Surgeon:  Earnie Larsson, MD      DISCUSSION: 62 yo male former smoker for above procedure. Pertinent medical hx includes Anxiety, Depression, GERD, HTN, CKD II (Hx of RCC s/p partial nephrectomy), CAD (01/2013 CABGx3: LIMA->LAD, VG->Diag, VG->OM (performed @ time of AVR), occluded SVG-OM2 , now with 95% stenosis at the anastomosis site on OM 2 - PCI attempt 07/28/13 unsuccessful - med Rx cont'd. Successful PCI of OM1 08/10/2013 with 99% mid obtuse marginal stenosis reduced to 0% with TIMI grade 3 flow)  Pt was seen by Cecilie Kicks, NP on 08/17/2017 and received cardiac clearance. Per her note "Pre-op exam for spina fusion by Dr. Amado Coe.  Pt is 0.9% risk for cardiac complication.  No angina and is able to meet 4 METS without angina.  His AVR with pericardial tissue valve is stable.  It will be OK for him to be off ASA for 3-5 days for surgery.  He should resume post op."  Pt with complex cardiac history but stable according to cardiology notes and with ability to exercise >35mets without angina. ECHO in 2018 with EF 55-60% and normally functioning bioprosthesis. He has LBBB on EKG but this is not new.  Expect he can proceed with surgery as planned barring acute status change.  VS: BP (!) 142/86   Pulse 73   Temp 36.8 C   Resp 20   Ht 5\' 6"  (1.676 m)   Wt 165 lb 14.4 oz (75.3 kg)   SpO2 100%   BMI 26.78 kg/m   PROVIDERS: Wenda Low, MD is PCP  Daneen Schick, MD is cardiologist, last seen in office by Cecilie Kicks, NP 08/17/2017   LABS: Labs reviewed: Acceptable for surgery. (all labs ordered are listed, but only abnormal results are displayed)  Labs Reviewed  BASIC METABOLIC PANEL - Abnormal; Notable for the following components:      Result Value   BUN 27 (*)    All other  components within normal limits  CBC WITH DIFFERENTIAL/PLATELET - Abnormal; Notable for the following components:   Platelets 129 (*)    All other components within normal limits  SURGICAL PCR SCREEN  TYPE AND SCREEN     IMAGES: CHEST  2 VIEW 08/30/2013  COMPARISON:  Chest x-ray of 06/06/2013  FINDINGS: No infiltrate or effusion is seen. Linear atelectasis remains in the region of the lingula with slight interval improvement. There is little change in slight elevation of the left hemidiaphragm. Mild cardiomegaly is stable.  IMPRESSION: Some improvement in linear atelectasis in the lingula.   EKG: 08/17/2017:  Sinus rhythm. LBBB. No interval change   CV: Echo 11/25/16  Study Conclusions  - Left ventricle: The cavity size was normal. There was moderate concentric hypertrophy. Systolic function was normal. The estimated ejection fraction was in the range of 55% to 60%. Wall motion was normal; there were no regional wall motion abnormalities. Features are consistent with a pseudonormal left ventricular filling pattern, with concomitant abnormal relaxation and increased filling pressure (grade 2 diastolic dysfunction). Doppler parameters are consistent with high ventricular filling pressure. - Aortic valve: Poorly visualized. A bioprosthesis was present and functioning normally. Mean gradient (S): 17 mm Hg. - Mitral valve: Moderately calcified annulus. Mild diffuse calcification of the  anterior leaflet. There was mild regurgitation. - Left atrium: The atrium was mildly dilated. 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 - Atrial septum: There was increased thickness of the septum, consistent with lipomatous hypertrophy. - Tricuspid valve: There was trivial regurgitation.  Left Heart Catheterization 07/28/2013 (this was an unsuccessful attempt at PCI to OM2. Subsequently on 08/10/2013 Dr. Tamala Julian performed successful PCI to  OM1)  FINDINGS:  Hemodynamics:   Central Aortic Pressure / Mean: 107/70/86  mmHg  Left Ventricular Pressure / LVEDP: 124/0/16 mmHg   Mild Residual Aortic Valve Stenosis  Left Ventriculography: deferred   Aortic/Subclavian Anatomy:   The aortic root is extremely dilated and configured such that the heart has almost horizontal well seated prosthetic aortic valve with extremely dilated sinuses of Valsalva making engagement of coronary ostia very difficult.   The Left Subclavian Artery is extremely tortuous with essentially a S turn prior to giving off the vertebral artery and continuing down into the axilla.  The IMA takeoff is posterior just prior to the final bend.   Coronary Anatomy: Left Dominant   Left Main: Large-caliber, short vessel that bifurcates into the LAD and Circumflex.  The takeoff of the left main has almost vertical with the aorta being horizontal. The takeoff of the circumflex is almost 110 from the LAD. The left main is calcified but angiographically normal.  LAD: Moderate caliber vessel that gives off diffusely diseased diagonal branch for being included in the mid vessel.   D1: Smaller moderate caliber vessel with diffuse disease and evidence of repetitive flow distally to   Left Circumflex: Large-caliber, dominant vessel that gives off 2 very small proximal marginal branches. It then gives off a large caliber OM1 before coursing into the AV groove where it gives off a left posterolateral artery and the posterior descending artery.  There is diffuse mild to moderate disease in the AV groove circumflex distally to the PDA.   OM1: Large-caliber vessel with a almost vertical takeoff from the circumflex that gives off a small branch proximally. It then courses down along the inferolateral wall with the long extensor 60-70% stenosis. The vessel then normalizes until the likely graft anastomosis site where there is a focal 95-99% stenosis. Following this there is post  stenotic dilation and the relatively normal vessel distally. The vessel courses at end of apex.    XAJ:OINOM caliber, nondominant vessel is diffusely diseased. Very difficult to engage .  Graft Anatomy:  LIMA-LAD: Difficult takeoff, but widely patent graft that reaches the mid LAD. Antegrade flow is relatively normal LAD distally. Minimal retrograde flow noted.    SVG-D1: Small to moderate caliber graft to a small caliber diagonal vessel. There is mild ostial disease in the graft and otherwise relatively normal.  Minimal retrograde flow up the diagonal. Antegrade flow shows multiple branches of the diagonal but no significant stenosis.  SVG-OM: 100% aorto-stial occlusion   Past Medical History:  Diagnosis Date  . Anginal pain ([place]) 2015   "since valve was done"  . Anxiety   . Arthritis    "left pointer finger; forminal openings bilaterally" (08/10/2013)  . Bicuspid aortic valve    a. 01/2013 s/p AVR - 26mm Edwards 3300 TFX pericardial tissue valve, ser # [id number].  Marland Kitchen CKD (chronic kidney disease) stage 2, GFR 60-89 ml/min   . Coronary atherosclerosis of native coronary artery    a. 01/2013 CABGx3: LIMA->LAD, VG->Diag, VG->OM (performed @ time of AVR),  occluded SVG-OM2 , now with 95% stenosis at  the anastomosis site on OM 2 - PCI attempt 07/28/13 unsuccessful - med Rx cont'd   . Depression   . Erectile dysfunction    INJECTIONS OF PROSTAGLANDIN BY UROLGIST  . Essential hypertension, benign   . GERD (gastroesophageal reflux disease)   . H/O Bell's palsy 2010 X2  . Heart murmur   . History of blood transfusion    "related to stomach bleeding from ASA & NSAIDS  . History of stomach ulcers   . Hyperlipidemia   . IBS (irritable bowel syndrome)   . Migraine    "went away in the 1990's"  . Pain, chronic postoperative    S/P LEFT ARM SURGERY  . Renal cell carcinoma of right kidney ([place]) 2003    Past Surgical History:  Procedure Laterality Date  . ANAL SPHINCTEROTOMY  04/13/03    DR. GRAPEY  . AORTIC VALVE REPLACEMENT N/A 02/14/2013   Procedure: AORTIC VALVE REPLACEMENT (AVR);  Surgeon: Ivin Poot, MD;  Location: [place];  Service: Open Heart Surgery;  Laterality: N/A;  . BUNIONECTOMY     right  02/2009  . CARDIAC CATHETERIZATION  2010; 6/12015; 07/29/2013  . CARDIAC VALVE REPLACEMENT    . COLONOSCOPY  2008   DIVERTICULOSIS  . CORONARY ANGIOPLASTY WITH STENT PLACEMENT  08/10/2013   "1"  . CORONARY ARTERY BYPASS GRAFT N/A 02/14/2013   Procedure: CORONARY ARTERY BYPASS GRAFTING (CABG);  Surgeon: Ivin Poot, MD;  Location: [place];  Service: Open Heart Surgery;  Laterality: N/A;  . ELBOW SURGERY Left X 2  . ELBOW SURGERY Right X 2  . ELBOW SURGERY     left  2009  . EXCISIONAL HEMORRHOIDECTOMY    . HERNIA REPAIR Left 1957  . HERNIA REPAIR     06/21/17  . INTRAOPERATIVE TRANSESOPHAGEAL ECHOCARDIOGRAM N/A 02/14/2013   Procedure: INTRAOPERATIVE TRANSESOPHAGEAL ECHOCARDIOGRAM;  Surgeon: Ivin Poot, MD;  Location: [place];  Service: Open Heart Surgery;  Laterality: N/A;  . LATERAL EPICONDYLE RELEASE Left 07/29/07   DR. GRAVES  . LEFT AND RIGHT HEART CATHETERIZATION WITH CORONARY ANGIOGRAM N/A 01/27/2013   Procedure: LEFT AND RIGHT HEART CATHETERIZATION WITH CORONARY ANGIOGRAM;  Surgeon: Sinclair Grooms, MD;  Location: [facility] CATH LAB;  Service: Cardiovascular;  Laterality: N/A;  . LEFT HEART CATHETERIZATION WITH CORONARY/GRAFT ANGIOGRAM N/A 07/28/2013   Procedure: LEFT HEART CATHETERIZATION WITH Beatrix Fetters;  Surgeon: Leonie Man, MD;  Location: [facility] CATH LAB;  Service: Cardiovascular;  Laterality: N/A;  . PARTIAL NEPHRECTOMY  08/24/02   RIGHT...DR. Risa Grill  . PERCUTANEOUS STENT INTERVENTION N/A 08/10/2013   Procedure: PERCUTANEOUS STENT INTERVENTION;  Surgeon: Sinclair Grooms, MD;  Location: [facility] C[facility] LAB;  Service: Cardiovascular;  Laterality: N/A;  . TOE SURGERY Right    "shaved; wired back straight"  . TONSILLECTOMY      MEDICATIONS: . ALPRAZolam  (XANAX) 1 MG tablet  . amLODipine (NORVASC) 5 MG tablet  . aspirin EC 81 MG EC tablet  . carisoprodol (SOMA) 350 MG tablet  . Diclofenac-Misoprostol (ARTHROTEC) 50-0.2 MG TBEC  . DULoxetine (CYMBALTA) 30 MG capsule  . furosemide (LASIX) 40 MG tablet  . hyoscyamine (LEVSIN SL) 0.125 MG SL tablet  . irbesartan (AVAPRO) 150 MG tablet  . loperamide (IMODIUM A-D) 2 MG tablet  . Naphazoline HCl (CLEAR EYES OP)  . nitroGLYCERIN (NITROSTAT) 0.4 MG SL tablet  . oxyCODONE (OXYCONTIN) 20 mg 12 hr tablet  . oxyCODONE-acetaminophen (PERCOCET) 10-325 MG tablet  . sertraline (ZOLOFT) 100 MG tablet  . testosterone cypionate (  DEPOTESTOSTERONE CYPIONATE) 200 MG/ML injection  . zolpidem (AMBIEN) 10 MG tablet   No current facility-administered medications for this encounter.      Wynonia Musty [facility] Short Stay Center/Anesthesiology Phone [phone] 09/02/2017 9:37 AM

## 2017-09-05 NOTE — Anesthesia Preprocedure Evaluation (Addendum)
Anesthesia Evaluation  Patient identified by MRN, date of birth, ID band Patient awake    Reviewed: Allergy & Precautions, H&P , NPO status , Patient's Chart, lab work & pertinent test results, reviewed documented beta blocker date and time   Airway Mallampati: II  TM Distance: >3 FB Neck ROM: Full    Dental  (+) Teeth Intact, Dental Advisory Given   Pulmonary former smoker,    breath sounds clear to auscultation       Cardiovascular hypertension, Pt. on medications (-) angina+ CAD  + Valvular Problems/Murmurs  Rhythm:Regular Rate:Normal + Systolic murmurs Echo 97/9480 - Left ventricle: The cavity size was normal. There was moderate concentric hypertrophy. Systolic function was normal. The estimated ejection fraction was in the range of 55% to 60%. Wall motion was normal; there were no regional wall motion abnormalities. Features are consistent with a pseudonormal left ventricular filling pattern, with concomitant abnormal relaxation and increased filling pressure (grade 2 diastolic dysfunction). Doppler parameters are consistent with high ventricular filling   pressure. - Aortic valve: Poorly visualized. A bioprosthesis was present and   functioning normally. Mean gradient (S): 17 mm Hg. - Mitral valve: Moderately calcified annulus. Mild diffuse   calcification of the anterior leaflet. There was mild   regurgitation. - Left atrium: The atrium was mildly dilated. 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 - Atrial septum: There was increased thickness of the septum, consistent with lipomatous hypertrophy. - Tricuspid valve: There was trivial regurgitation.   Neuro/Psych  Headaches, PSYCHIATRIC DISORDERS Anxiety Depression    GI/Hepatic GERD  ,  Endo/Other    Renal/GU Renal disease     Musculoskeletal   Abdominal   Peds  Hematology   Anesthesia Other Findings   Reproductive/Obstetrics                             Anesthesia Physical  Anesthesia Plan  ASA: III  Anesthesia Plan: General   Post-op Pain Management:    Induction: Intravenous  PONV Risk Score and Plan: 3 and Ondansetron and Dexamethasone  Airway Management Planned: Oral ETT  Additional Equipment:   Intra-op Plan:   Post-operative Plan: Extubation in OR  Informed Consent: I have reviewed the patients History and Physical, chart, labs and discussed the procedure including the risks, benefits and alternatives for the proposed anesthesia with the patient or authorized representative who has indicated his/her understanding and acceptance.   Dental advisory given  Plan Discussed with: CRNA  Anesthesia Plan Comments:       Anesthesia Quick Evaluation

## 2017-09-06 ENCOUNTER — Inpatient Hospital Stay (HOSPITAL_COMMUNITY): Payer: Medicare Other

## 2017-09-06 ENCOUNTER — Inpatient Hospital Stay (HOSPITAL_COMMUNITY): Payer: Medicare Other | Admitting: Physician Assistant

## 2017-09-06 ENCOUNTER — Encounter (HOSPITAL_COMMUNITY): Payer: Self-pay | Admitting: *Deleted

## 2017-09-06 ENCOUNTER — Inpatient Hospital Stay (HOSPITAL_COMMUNITY)
Admission: RE | Admit: 2017-09-06 | Discharge: 2017-09-07 | DRG: 455 | Disposition: A | Discharge status: Home / Self Care | Payer: Medicare Other | Attending: Neurosurgery | Admitting: Neurosurgery

## 2017-09-06 ENCOUNTER — Inpatient Hospital Stay (HOSPITAL_COMMUNITY): Payer: Medicare Other | Admitting: Anesthesiology

## 2017-09-06 ENCOUNTER — Inpatient Hospital Stay (HOSPITAL_COMMUNITY)
Admission: RE | Disposition: A | Discharge status: Home / Self Care | Payer: Self-pay | Source: Home / Self Care | Attending: Neurosurgery

## 2017-09-06 ENCOUNTER — Other Ambulatory Visit: Payer: Self-pay

## 2017-09-06 DIAGNOSIS — Z79890 Hormone replacement therapy (postmenopausal): Secondary | ICD-10-CM | POA: Diagnosis not present

## 2017-09-06 DIAGNOSIS — Z955 Presence of coronary angioplasty implant and graft: Secondary | ICD-10-CM

## 2017-09-06 DIAGNOSIS — N182 Chronic kidney disease, stage 2 (mild): Secondary | ICD-10-CM | POA: Diagnosis present

## 2017-09-06 DIAGNOSIS — I129 Hypertensive chronic kidney disease with stage 1 through stage 4 chronic kidney disease, or unspecified chronic kidney disease: Secondary | ICD-10-CM | POA: Diagnosis present

## 2017-09-06 DIAGNOSIS — M4317 Spondylolisthesis, lumbosacral region: Secondary | ICD-10-CM | POA: Diagnosis present

## 2017-09-06 DIAGNOSIS — F419 Anxiety disorder, unspecified: Secondary | ICD-10-CM | POA: Diagnosis present

## 2017-09-06 DIAGNOSIS — Z888 Allergy status to other drugs, medicaments and biological substances status: Secondary | ICD-10-CM

## 2017-09-06 DIAGNOSIS — Z886 Allergy status to analgesic agent status: Secondary | ICD-10-CM | POA: Diagnosis not present

## 2017-09-06 DIAGNOSIS — E785 Hyperlipidemia, unspecified: Secondary | ICD-10-CM | POA: Diagnosis present

## 2017-09-06 DIAGNOSIS — Z85528 Personal history of other malignant neoplasm of kidney: Secondary | ICD-10-CM | POA: Diagnosis not present

## 2017-09-06 DIAGNOSIS — Z87891 Personal history of nicotine dependence: Secondary | ICD-10-CM | POA: Diagnosis not present

## 2017-09-06 DIAGNOSIS — K589 Irritable bowel syndrome without diarrhea: Secondary | ICD-10-CM | POA: Diagnosis present

## 2017-09-06 DIAGNOSIS — M4807 Spinal stenosis, lumbosacral region: Secondary | ICD-10-CM | POA: Diagnosis present

## 2017-09-06 DIAGNOSIS — I2510 Atherosclerotic heart disease of native coronary artery without angina pectoris: Secondary | ICD-10-CM | POA: Diagnosis present

## 2017-09-06 DIAGNOSIS — Z880 Allergy status to penicillin: Secondary | ICD-10-CM | POA: Diagnosis not present

## 2017-09-06 DIAGNOSIS — Z953 Presence of xenogenic heart valve: Secondary | ICD-10-CM | POA: Diagnosis not present

## 2017-09-06 DIAGNOSIS — Z905 Acquired absence of kidney: Secondary | ICD-10-CM | POA: Diagnosis not present

## 2017-09-06 DIAGNOSIS — F329 Major depressive disorder, single episode, unspecified: Secondary | ICD-10-CM | POA: Diagnosis present

## 2017-09-06 DIAGNOSIS — Z720 Tobacco use: Secondary | ICD-10-CM

## 2017-09-06 DIAGNOSIS — M545 Low back pain: Secondary | ICD-10-CM | POA: Diagnosis present

## 2017-09-06 DIAGNOSIS — Z951 Presence of aortocoronary bypass graft: Secondary | ICD-10-CM | POA: Diagnosis not present

## 2017-09-06 DIAGNOSIS — Z79899 Other long term (current) drug therapy: Secondary | ICD-10-CM | POA: Diagnosis not present

## 2017-09-06 DIAGNOSIS — Z419 Encounter for procedure for purposes other than remedying health state, unspecified: Secondary | ICD-10-CM

## 2017-09-06 LAB — TYPE AND SCREEN
ABO/RH(D): A POS
Antibody Screen: POSITIVE

## 2017-09-06 SURGERY — POSTERIOR LUMBAR FUSION 1 LEVEL
Anesthesia: General | Site: Spine Lumbar

## 2017-09-06 MED ORDER — ROCURONIUM BROMIDE 10 MG/ML (PF) SYRINGE
PREFILLED_SYRINGE | INTRAVENOUS | Status: DC | PRN
  Administered 2017-09-06: 50 mg via INTRAVENOUS
  Administered 2017-09-06: 20 mg via INTRAVENOUS
  Administered 2017-09-06: 10 mg via INTRAVENOUS
  Administered 2017-09-06: 20 mg via INTRAVENOUS

## 2017-09-06 MED ORDER — SUGAMMADEX SODIUM 200 MG/2ML IV SOLN
INTRAVENOUS | Status: AC
  Filled 2017-09-06: qty 2

## 2017-09-06 MED ORDER — ALPRAZOLAM 0.5 MG PO TABS
1.0000 mg | ORAL_TABLET | Freq: Four times a day (QID) | ORAL | Status: DC
  Administered 2017-09-06 – 2017-09-07 (×4): 1 mg via ORAL
  Filled 2017-09-06 (×4): qty 2

## 2017-09-06 MED ORDER — HYDROMORPHONE HCL 1 MG/ML IJ SOLN
1.0000 mg | INTRAMUSCULAR | Status: DC | PRN
  Administered 2017-09-06 (×2): 1 mg via INTRAVENOUS
  Filled 2017-09-06 (×2): qty 1

## 2017-09-06 MED ORDER — THROMBIN (RECOMBINANT) 20000 UNITS EX SOLR
CUTANEOUS | Status: AC
  Filled 2017-09-06: qty 20000

## 2017-09-06 MED ORDER — FUROSEMIDE 40 MG PO TABS
40.0000 mg | ORAL_TABLET | Freq: Every day | ORAL | Status: DC
  Filled 2017-09-06: qty 1

## 2017-09-06 MED ORDER — SODIUM CHLORIDE 0.9 % IV SOLN
INTRAVENOUS | Status: DC | PRN
  Administered 2017-09-06: 50 ug/min via INTRAVENOUS

## 2017-09-06 MED ORDER — FENTANYL CITRATE (PF) 250 MCG/5ML IJ SOLN
INTRAMUSCULAR | Status: DC | PRN
  Administered 2017-09-06: 100 ug via INTRAVENOUS
  Administered 2017-09-06 (×2): 50 ug via INTRAVENOUS
  Administered 2017-09-06: 100 ug via INTRAVENOUS

## 2017-09-06 MED ORDER — LOPERAMIDE HCL 2 MG PO CAPS
4.0000 mg | ORAL_CAPSULE | Freq: Three times a day (TID) | ORAL | Status: DC | PRN
  Filled 2017-09-06: qty 2

## 2017-09-06 MED ORDER — SUGAMMADEX SODIUM 200 MG/2ML IV SOLN
INTRAVENOUS | Status: DC | PRN
  Administered 2017-09-06: 200 mg via INTRAVENOUS

## 2017-09-06 MED ORDER — FENTANYL CITRATE (PF) 100 MCG/2ML IJ SOLN
INTRAMUSCULAR | Status: AC
  Administered 2017-09-06: 50 ug via INTRAVENOUS
  Filled 2017-09-06: qty 2

## 2017-09-06 MED ORDER — VANCOMYCIN HCL 1 G IV SOLR
INTRAVENOUS | Status: DC | PRN
  Administered 2017-09-06: 1000 mg via TOPICAL

## 2017-09-06 MED ORDER — FLEET ENEMA 7-19 GM/118ML RE ENEM: 1.0000 | ENEMA | Freq: Once | RECTAL | Status: DC | PRN

## 2017-09-06 MED ORDER — PHENYLEPHRINE 40 MCG/ML (10ML) SYRINGE FOR IV PUSH (FOR BLOOD PRESSURE SUPPORT)
PREFILLED_SYRINGE | INTRAVENOUS | Status: AC
  Filled 2017-09-06: qty 10

## 2017-09-06 MED ORDER — PROPOFOL 10 MG/ML IV BOLUS
INTRAVENOUS | Status: DC | PRN
  Administered 2017-09-06: 200 mg via INTRAVENOUS

## 2017-09-06 MED ORDER — 0.9 % SODIUM CHLORIDE (POUR BTL) OPTIME
TOPICAL | Status: DC | PRN
  Administered 2017-09-06: 1000 mL

## 2017-09-06 MED ORDER — ACETAMINOPHEN 650 MG RE SUPP: 650.0000 mg | RECTAL | Status: DC | PRN

## 2017-09-06 MED ORDER — NITROGLYCERIN 0.4 MG SL SUBL: 0.4000 mg | SUBLINGUAL_TABLET | SUBLINGUAL | Status: DC | PRN

## 2017-09-06 MED ORDER — PHENOL 1.4 % MT LIQD: 1.0000 | OROMUCOSAL | Status: DC | PRN

## 2017-09-06 MED ORDER — OXYCODONE HCL 5 MG PO TABS
10.0000 mg | ORAL_TABLET | ORAL | Status: DC | PRN
  Administered 2017-09-06 – 2017-09-07 (×4): 10 mg via ORAL
  Filled 2017-09-06 (×3): qty 2

## 2017-09-06 MED ORDER — LACTATED RINGERS IV SOLN: INTRAVENOUS | Status: DC

## 2017-09-06 MED ORDER — BISACODYL 10 MG RE SUPP: 10.0000 mg | Freq: Every day | RECTAL | Status: DC | PRN

## 2017-09-06 MED ORDER — OXYCODONE HCL ER 20 MG PO T12A
20.0000 mg | EXTENDED_RELEASE_TABLET | Freq: Two times a day (BID) | ORAL | Status: DC
  Administered 2017-09-06 – 2017-09-07 (×2): 20 mg via ORAL
  Filled 2017-09-06 (×3): qty 1

## 2017-09-06 MED ORDER — OXYCODONE HCL 5 MG PO TABS
ORAL_TABLET | ORAL | Status: AC
  Administered 2017-09-06: 10 mg via ORAL
  Filled 2017-09-06: qty 2

## 2017-09-06 MED ORDER — LACTATED RINGERS IV SOLN
INTRAVENOUS | Status: DC | PRN
  Administered 2017-09-06 (×2): via INTRAVENOUS

## 2017-09-06 MED ORDER — IRBESARTAN 150 MG PO TABS
150.0000 mg | ORAL_TABLET | Freq: Every day | ORAL | Status: DC
  Administered 2017-09-06: 150 mg via ORAL
  Filled 2017-09-06 (×2): qty 1

## 2017-09-06 MED ORDER — CHLORHEXIDINE GLUCONATE CLOTH 2 % EX PADS: 6.0000 | MEDICATED_PAD | Freq: Once | CUTANEOUS | Status: DC

## 2017-09-06 MED ORDER — MIDAZOLAM HCL 2 MG/2ML IJ SOLN
INTRAMUSCULAR | Status: DC | PRN
  Administered 2017-09-06: 2 mg via INTRAVENOUS

## 2017-09-06 MED ORDER — THROMBIN 20000 UNITS EX SOLR
CUTANEOUS | Status: DC | PRN
  Administered 2017-09-06: 20 mL via TOPICAL

## 2017-09-06 MED ORDER — VANCOMYCIN HCL IN DEXTROSE 750-5 MG/150ML-% IV SOLN
750.0000 mg | Freq: Once | INTRAVENOUS | Status: AC
  Administered 2017-09-06: 750 mg via INTRAVENOUS
  Filled 2017-09-06: qty 150

## 2017-09-06 MED ORDER — CEFAZOLIN SODIUM-DEXTROSE 2-4 GM/100ML-% IV SOLN
2.0000 g | INTRAVENOUS | Status: AC
  Administered 2017-09-06: 2 g via INTRAVENOUS
  Filled 2017-09-06: qty 100

## 2017-09-06 MED ORDER — SODIUM CHLORIDE 0.9 % IV SOLN: 250.0000 mL | INTRAVENOUS | Status: DC

## 2017-09-06 MED ORDER — LIDOCAINE 2% (20 MG/ML) 5 ML SYRINGE
INTRAMUSCULAR | Status: DC | PRN
  Administered 2017-09-06: 100 mg via INTRAVENOUS

## 2017-09-06 MED ORDER — BUPIVACAINE HCL (PF) 0.25 % IJ SOLN
INTRAMUSCULAR | Status: DC | PRN
Start: 2017-09-06 — End: 2017-09-06
  Administered 2017-09-06: 30 mL

## 2017-09-06 MED ORDER — ONDANSETRON HCL 4 MG/2ML IJ SOLN
INTRAMUSCULAR | Status: DC | PRN
  Administered 2017-09-06: 4 mg via INTRAVENOUS

## 2017-09-06 MED ORDER — FENTANYL CITRATE (PF) 100 MCG/2ML IJ SOLN
INTRAMUSCULAR | Status: AC
  Filled 2017-09-06: qty 2

## 2017-09-06 MED ORDER — BUPIVACAINE HCL (PF) 0.25 % IJ SOLN
INTRAMUSCULAR | Status: AC
  Filled 2017-09-06: qty 30

## 2017-09-06 MED ORDER — POLYETHYLENE GLYCOL 3350 17 G PO PACK: 17.0000 g | PACK | Freq: Every day | ORAL | Status: DC | PRN

## 2017-09-06 MED ORDER — ONDANSETRON HCL 4 MG/2ML IJ SOLN: 4.0000 mg | Freq: Four times a day (QID) | INTRAMUSCULAR | Status: DC | PRN

## 2017-09-06 MED ORDER — HYDROMORPHONE HCL 1 MG/ML IJ SOLN
0.2500 mg | INTRAMUSCULAR | Status: DC | PRN
  Administered 2017-09-06 (×2): 0.5 mg via INTRAVENOUS

## 2017-09-06 MED ORDER — LIDOCAINE 2% (20 MG/ML) 5 ML SYRINGE
INTRAMUSCULAR | Status: AC
  Filled 2017-09-06: qty 5

## 2017-09-06 MED ORDER — LACTATED RINGERS IV SOLN
INTRAVENOUS | Status: DC
  Administered 2017-09-06: 23:00:00 via INTRAVENOUS

## 2017-09-06 MED ORDER — MENTHOL 3 MG MT LOZG: 1.0000 | LOZENGE | OROMUCOSAL | Status: DC | PRN

## 2017-09-06 MED ORDER — ZOLPIDEM TARTRATE 5 MG PO TABS
10.0000 mg | ORAL_TABLET | Freq: Every day | ORAL | Status: DC
  Administered 2017-09-06: 10 mg via ORAL
  Filled 2017-09-06: qty 2

## 2017-09-06 MED ORDER — ROCURONIUM BROMIDE 10 MG/ML (PF) SYRINGE
PREFILLED_SYRINGE | INTRAVENOUS | Status: AC
  Filled 2017-09-06: qty 10

## 2017-09-06 MED ORDER — SODIUM CHLORIDE 0.9 % IV SOLN
INTRAVENOUS | Status: DC | PRN
  Administered 2017-09-06: 500 mL

## 2017-09-06 MED ORDER — SERTRALINE HCL 50 MG PO TABS
100.0000 mg | ORAL_TABLET | Freq: Every day | ORAL | Status: DC
  Administered 2017-09-06 – 2017-09-07 (×2): 100 mg via ORAL
  Filled 2017-09-06 (×2): qty 2

## 2017-09-06 MED ORDER — CARISOPRODOL 350 MG PO TABS
350.0000 mg | ORAL_TABLET | Freq: Four times a day (QID) | ORAL | Status: DC | PRN
  Administered 2017-09-06 – 2017-09-07 (×3): 350 mg via ORAL
  Filled 2017-09-06 (×3): qty 1

## 2017-09-06 MED ORDER — FENTANYL CITRATE (PF) 250 MCG/5ML IJ SOLN
INTRAMUSCULAR | Status: AC
  Filled 2017-09-06: qty 5

## 2017-09-06 MED ORDER — ONDANSETRON HCL 4 MG/2ML IJ SOLN
INTRAMUSCULAR | Status: AC
  Filled 2017-09-06: qty 2

## 2017-09-06 MED ORDER — MEPERIDINE HCL 50 MG/ML IJ SOLN: 6.2500 mg | INTRAMUSCULAR | Status: DC | PRN

## 2017-09-06 MED ORDER — SODIUM CHLORIDE 0.9% FLUSH: 3.0000 mL | INTRAVENOUS | Status: DC | PRN

## 2017-09-06 MED ORDER — VANCOMYCIN HCL 1000 MG IV SOLR
INTRAVENOUS | Status: AC
Start: 2017-09-06 — End: ?
  Filled 2017-09-06: qty 1000

## 2017-09-06 MED ORDER — METHOCARBAMOL 500 MG PO TABS
ORAL_TABLET | ORAL | Status: AC
  Administered 2017-09-06: 500 mg
  Filled 2017-09-06: qty 1

## 2017-09-06 MED ORDER — PHENYLEPHRINE 40 MCG/ML (10ML) SYRINGE FOR IV PUSH (FOR BLOOD PRESSURE SUPPORT)
PREFILLED_SYRINGE | INTRAVENOUS | Status: DC | PRN
  Administered 2017-09-06: 100 ug via INTRAVENOUS
  Administered 2017-09-06: 120 ug via INTRAVENOUS
  Administered 2017-09-06: 80 ug via INTRAVENOUS

## 2017-09-06 MED ORDER — FENTANYL CITRATE (PF) 100 MCG/2ML IJ SOLN
25.0000 ug | INTRAMUSCULAR | Status: DC | PRN
  Administered 2017-09-06 (×3): 50 ug via INTRAVENOUS

## 2017-09-06 MED ORDER — DULOXETINE HCL 30 MG PO CPEP
30.0000 mg | ORAL_CAPSULE | Freq: Two times a day (BID) | ORAL | Status: DC
  Administered 2017-09-06 – 2017-09-07 (×3): 30 mg via ORAL
  Filled 2017-09-06 (×3): qty 1

## 2017-09-06 MED ORDER — HYDROCODONE-ACETAMINOPHEN 10-325 MG PO TABS: 1.0000 | ORAL_TABLET | ORAL | Status: DC | PRN

## 2017-09-06 MED ORDER — HYDROMORPHONE HCL 1 MG/ML IJ SOLN
INTRAMUSCULAR | Status: AC
  Administered 2017-09-06: 0.5 mg via INTRAVENOUS
  Filled 2017-09-06: qty 1

## 2017-09-06 MED ORDER — DEXAMETHASONE SODIUM PHOSPHATE 10 MG/ML IJ SOLN
10.0000 mg | INTRAMUSCULAR | Status: AC
  Administered 2017-09-06: 10 mg via INTRAVENOUS
  Filled 2017-09-06: qty 1

## 2017-09-06 MED ORDER — DEXAMETHASONE SODIUM PHOSPHATE 10 MG/ML IJ SOLN
INTRAMUSCULAR | Status: AC
  Filled 2017-09-06: qty 1

## 2017-09-06 MED ORDER — HYOSCYAMINE SULFATE 0.125 MG SL SUBL
0.2500 mg | SUBLINGUAL_TABLET | Freq: Four times a day (QID) | SUBLINGUAL | Status: DC | PRN
  Filled 2017-09-06: qty 2

## 2017-09-06 MED ORDER — EPHEDRINE SULFATE 50 MG/ML IJ SOLN
INTRAMUSCULAR | Status: AC
  Filled 2017-09-06: qty 1

## 2017-09-06 MED ORDER — MIDAZOLAM HCL 2 MG/2ML IJ SOLN
INTRAMUSCULAR | Status: AC
  Filled 2017-09-06: qty 2

## 2017-09-06 MED ORDER — PROPOFOL 10 MG/ML IV BOLUS
INTRAVENOUS | Status: AC
  Filled 2017-09-06: qty 20

## 2017-09-06 MED ORDER — SODIUM CHLORIDE 0.9% FLUSH: 3.0000 mL | Freq: Two times a day (BID) | INTRAVENOUS | Status: DC

## 2017-09-06 MED ORDER — PROMETHAZINE HCL 25 MG/ML IJ SOLN: 6.2500 mg | INTRAMUSCULAR | Status: DC | PRN

## 2017-09-06 MED ORDER — ONDANSETRON HCL 4 MG PO TABS: 4.0000 mg | ORAL_TABLET | Freq: Four times a day (QID) | ORAL | Status: DC | PRN

## 2017-09-06 MED ORDER — AMLODIPINE BESYLATE 5 MG PO TABS
5.0000 mg | ORAL_TABLET | Freq: Every day | ORAL | Status: DC
  Administered 2017-09-06: 5 mg via ORAL
  Filled 2017-09-06 (×2): qty 1

## 2017-09-06 MED ORDER — ACETAMINOPHEN 325 MG PO TABS
650.0000 mg | ORAL_TABLET | ORAL | Status: DC | PRN
  Administered 2017-09-06 – 2017-09-07 (×2): 650 mg via ORAL
  Filled 2017-09-06 (×2): qty 2

## 2017-09-06 SURGICAL SUPPLY — 65 items
BAG DECANTER FOR FLEXI CONT (MISCELLANEOUS) ×2 IMPLANT
BENZOIN TINCTURE PRP APPL 2/3 (GAUZE/BANDAGES/DRESSINGS) ×2 IMPLANT
BLADE CLIPPER SURG (BLADE) ×2 IMPLANT
BUR CUTTER 7.0 ROUND (BURR) IMPLANT
BUR MATCHSTICK NEURO 3.0 LAGG (BURR) ×2 IMPLANT
CANISTER SUCT 3000ML PPV (MISCELLANEOUS) ×2 IMPLANT
CAP LCK SPNE (Orthopedic Implant) ×4 IMPLANT
CAP LOCK SPINE RADIUS (Orthopedic Implant) ×4 IMPLANT
CAP LOCKING (Orthopedic Implant) ×4 IMPLANT
CARTRIDGE OIL MAESTRO DRILL (MISCELLANEOUS) ×1 IMPLANT
CONT SPEC 4OZ CLIKSEAL STRL BL (MISCELLANEOUS) ×2 IMPLANT
COVER BACK TABLE 60X90IN (DRAPES) ×2 IMPLANT
DECANTER SPIKE VIAL GLASS SM (MISCELLANEOUS) ×2 IMPLANT
DERMABOND ADVANCED (GAUZE/BANDAGES/DRESSINGS) ×1
DERMABOND ADVANCED .7 DNX12 (GAUZE/BANDAGES/DRESSINGS) ×1 IMPLANT
DEVICE INTERBODY ELEVATE 9X23 (Cage) ×4 IMPLANT
DIFFUSER DRILL AIR PNEUMATIC (MISCELLANEOUS) ×2 IMPLANT
DRAPE C-ARM 42X72 X-RAY (DRAPES) ×4 IMPLANT
DRAPE HALF SHEET 40X57 (DRAPES) IMPLANT
DRAPE LAPAROTOMY 100X72X124 (DRAPES) ×2 IMPLANT
DRAPE SURG 17X23 STRL (DRAPES) ×8 IMPLANT
DRSG OPSITE POSTOP 4X6 (GAUZE/BANDAGES/DRESSINGS) ×2 IMPLANT
DURAPREP 26ML APPLICATOR (WOUND CARE) ×2 IMPLANT
ELECT REM PT RETURN 9FT ADLT (ELECTROSURGICAL) ×2
ELECTRODE REM PT RTRN 9FT ADLT (ELECTROSURGICAL) ×1 IMPLANT
EVACUATOR 1/8 PVC DRAIN (DRAIN) IMPLANT
GAUZE SPONGE 4X4 12PLY STRL (GAUZE/BANDAGES/DRESSINGS) IMPLANT
GAUZE SPONGE 4X4 16PLY XRAY LF (GAUZE/BANDAGES/DRESSINGS) IMPLANT
GLOVE BIOGEL PI IND STRL 7.0 (GLOVE) ×2 IMPLANT
GLOVE BIOGEL PI IND STRL 7.5 (GLOVE) ×2 IMPLANT
GLOVE BIOGEL PI INDICATOR 7.0 (GLOVE) ×2
GLOVE BIOGEL PI INDICATOR 7.5 (GLOVE) ×2
GLOVE ECLIPSE 6.5 STRL STRAW (GLOVE) ×2 IMPLANT
GLOVE ECLIPSE 9.0 STRL (GLOVE) ×4 IMPLANT
GLOVE EXAM NITRILE LRG STRL (GLOVE) IMPLANT
GLOVE EXAM NITRILE XL STR (GLOVE) IMPLANT
GLOVE EXAM NITRILE XS STR PU (GLOVE) IMPLANT
GLOVE SURG SS PI 7.0 STRL IVOR (GLOVE) ×6 IMPLANT
GOWN STRL REUS W/ TWL LRG LVL3 (GOWN DISPOSABLE) ×3 IMPLANT
GOWN STRL REUS W/ TWL XL LVL3 (GOWN DISPOSABLE) ×2 IMPLANT
GOWN STRL REUS W/TWL 2XL LVL3 (GOWN DISPOSABLE) IMPLANT
GOWN STRL REUS W/TWL LRG LVL3 (GOWN DISPOSABLE) ×3
GOWN STRL REUS W/TWL XL LVL3 (GOWN DISPOSABLE) ×2
KIT BASIN OR (CUSTOM PROCEDURE TRAY) ×2 IMPLANT
KIT TURNOVER KIT B (KITS) ×2 IMPLANT
MILL MEDIUM DISP (BLADE) ×2 IMPLANT
NEEDLE HYPO 22GX1.5 SAFETY (NEEDLE) ×2 IMPLANT
NS IRRIG 1000ML POUR BTL (IV SOLUTION) ×2 IMPLANT
OIL CARTRIDGE MAESTRO DRILL (MISCELLANEOUS) ×2
PACK LAMINECTOMY NEURO (CUSTOM PROCEDURE TRAY) ×2 IMPLANT
ROD RADIUS 35MM (Rod) ×2 IMPLANT
ROD RADIUS 40MM (Neuro Prosthesis/Implant) ×1 IMPLANT
ROD SPNL 40X5.5XNS TI RDS (Neuro Prosthesis/Implant) ×1 IMPLANT
SCREW 5.75X40M (Screw) ×4 IMPLANT
SCREW 5.75X45MM (Screw) ×4 IMPLANT
SPONGE SURGIFOAM ABS GEL 100 (HEMOSTASIS) ×2 IMPLANT
STRIP CLOSURE SKIN 1/2X4 (GAUZE/BANDAGES/DRESSINGS) ×4 IMPLANT
SUT VIC AB 0 CT1 18XCR BRD8 (SUTURE) ×1 IMPLANT
SUT VIC AB 0 CT1 8-18 (SUTURE) ×1
SUT VIC AB 2-0 CT1 18 (SUTURE) ×2 IMPLANT
SUT VIC AB 3-0 SH 8-18 (SUTURE) ×2 IMPLANT
TOWEL GREEN STERILE (TOWEL DISPOSABLE) ×2 IMPLANT
TOWEL GREEN STERILE FF (TOWEL DISPOSABLE) ×2 IMPLANT
TRAY FOLEY MTR SLVR 16FR STAT (SET/KITS/TRAYS/PACK) ×2 IMPLANT
WATER STERILE IRR 1000ML POUR (IV SOLUTION) ×2 IMPLANT

## 2017-09-06 NOTE — Progress Notes (Signed)
Dr. Annette Stable notified of suicide assessment.

## 2017-09-06 NOTE — Progress Notes (Signed)
Patient complained of severe pain and nurse took prescribed meds to patient's room. Patient complained about Dilaudid not working for him and requesting it changed to Fentanyl. Nurse tried to explain to patient that Dilaudid is the strongest medication he has ordered and that the MD will be notified. Patient also complained about other medications he has ordered and then complained about the temperature in the room. Thermostat showed it at 55 degrees but temp on the wall clock showed 66 degrees. Patient complained about the different in degrees. Nurse tried to explain to patient that the thermostat is the set temp and the temp on the wall clock is actual temp in room. The room needs to cool down. Wet wash cloth placed on patient's forehead to help cool him down and a fan brought to him. While in patient's room, MD arrived and explained to him about his orders for pain management. Patient not ok with what he was told but stated 'Any time I tried to talk to him about pain medicine, he walks out. It is what it is". Nurse administered meds. Will continue to monitor.

## 2017-09-06 NOTE — Anesthesia Postprocedure Evaluation (Signed)
Anesthesia Post Note  Patient: Max Byrd  Procedure(s) Performed: POSTERIOR LUMBAR INTERBODY FUSION LUMBAR FIVE-SACRAL ONE. (N/A Spine Lumbar)     Patient location during evaluation: PACU Anesthesia Type: General Level of consciousness: sedated and patient cooperative Pain management: pain level controlled Vital Signs Assessment: post-procedure vital signs reviewed and stable Respiratory status: spontaneous breathing Cardiovascular status: stable Anesthetic complications: no    Last Vitals:  Vitals:   09/06/17 1351 09/06/17 1603  BP: (!) 149/94 122/86  Pulse: 80 (!) 101  Resp: (!) 22 16  Temp: 36.9 C 36.9 C  SpO2: 100% 97%    Last Pain:  Vitals:   09/06/17 1611  TempSrc:   PainSc: [place]

## 2017-09-06 NOTE — Progress Notes (Signed)
Notified of low risk score. Patient remains safe, family member present in room, staff monitoring from outside. Discussion between [place] and [facility] [place] [facility] to determine needs prior to surgery. Theraputic Triage Services consult on hold until notified by [place].

## 2017-09-06 NOTE — Progress Notes (Signed)
Once Suicide assessment completed [place], NT observing patient.

## 2017-09-06 NOTE — Progress Notes (Signed)
Pharmacy asked to dose vancomycin as surgical prophylaxis post-op. Patient is s/p POSTERIOR LUMBAR INTERBODY FUSION LUMBAR FIVE-SACRAL ONE.   No drain placed per Op-note, therefore will redose vancomycin 750mg  IV x1 12 hours after pre-op dose.  Pharmacy will sign off. Thank you for the consult.   Max Byrd A. Levada Dy, PharmD, [place] Pager: [phone] Please utilize Amion for appropriate phone number to reach the unit pharmacist ([place])

## 2017-09-06 NOTE — Progress Notes (Signed)
CSW received call from pt's RN expressing that pt received a low risk suicide score. CSW advised RN to place TTS consult for pt to be assessed for this further. CSW provided RN with number for [facility] TTS to get further details on how to get TTS consult placed at this time.  Virgie Dad Maliea Grandmaison, MSW, [place] [facility] Clinical Social Worker [phone]

## 2017-09-06 NOTE — Brief Op Note (Signed)
09/06/2017  12:04 PM  PATIENT:  Max Byrd  62 y.o. male  PRE-OPERATIVE DIAGNOSIS:  Spondylolisthesis  POST-OPERATIVE DIAGNOSIS:  Spondylolisthesis  PROCEDURE:  Procedure(s): POSTERIOR LUMBAR INTERBODY FUSION LUMBAR FIVE-SACRAL ONE. (N/A)  SURGEON:  Surgeon(s) and Role:    * Yukiko Minnich, Mallie Mussel, MD - Primary    * Ashok Pall, MD - Assisting  PHYSICIAN ASSISTANT:   ASSISTANTS:    ANESTHESIA:   general  EBL:  300 mL   BLOOD ADMINISTERED:none  DRAINS: none   LOCAL MEDICATIONS USED:  [place]     SPECIMEN:  No Specimen  DISPOSITION OF SPECIMEN:  N/A  COUNTS:  YES  TOURNIQUET:  * No tourniquets in log *  DICTATION: .Dragon Dictation  PLAN OF CARE: Admit to inpatient   PATIENT DISPOSITION:  PACU - hemodynamically stable.   Delay start of Pharmacological VTE agent (>24hrs) due to surgical blood loss or risk of bleeding: yes

## 2017-09-06 NOTE — Progress Notes (Signed)
Notified Dr. Annette Stable that due to patient going to an inpatient unit that they are recommending a inpatient psychology consult. Dr. Annette Stable states he has assessed patient and that he does not need psychology consult.

## 2017-09-06 NOTE — Progress Notes (Signed)
On admission suicide risk questionnaire patient reported that he had thoughts of committing suicide in the remote past.  He has no active thoughts of suicide or self-harm.  He does not wish psychiatric evaluation and I do not think that it is appropriate at this time.

## 2017-09-06 NOTE — Evaluation (Signed)
Occupational Therapy Evaluation Patient Details Name: Max Byrd MRN: [id number] DOB: 1955/11/10 Today's Date: 09/06/2017    History of Present Illness 62 yo male s/p L5-S1 fusion. PMH including anxiety, CKD, and depression.   Clinical Impression   PTA, pt was living with his wife and was independent. Currently, pt requires supervision for ADLs and functional mobility. Provided education on back precautions, bed mobility, LB ADLs, toileting, and shower transfer; pt demonstrated and verbalized understanding. Pt would benefit from additional acute OT session for addressing brace management and LB dressing. Answered all pt questions. Recommend dc home once medically stable per physician.      Follow Up Recommendations  No OT follow up;Supervision - Intermittent    Equipment Recommendations  None recommended by OT    Recommendations for Other Services       Precautions / Restrictions Precautions Precautions: Back Precaution Booklet Issued: Yes (comment) Precaution Comments: Reveiwed back precautions and andherance during ADLs Required Braces or Orthoses: Spinal Brace Spinal Brace: Lumbar corset;Other (comment) Spinal Brace Comments: Brace not in room upon OT arrival      Mobility Bed Mobility Overal bed mobility: Modified Independent             General bed mobility comments: Educating pt on log roll technique. Pt able to perform supine<>sitting with increased time.   Transfers Overall transfer level: Needs assistance Equipment used: None Transfers: Sit to/from Stand Sit to Stand: Supervision         General transfer comment: supervision for safety    Balance Overall balance assessment: No apparent balance deficits (not formally assessed)                                         ADL either performed or assessed with clinical judgement   ADL Overall ADL's : Needs assistance/impaired Eating/Feeding: Set up;Sitting   Grooming: Set  up;Standing Grooming Details (indicate cue type and reason): Educated pt on compensatory techniques for oral care Upper Body Bathing: Set up;Supervision/ safety;Standing   Lower Body Bathing: Supervison/ safety;Sit to/from stand   Upper Body Dressing : Set up;Supervision/safety;Standing Upper Body Dressing Details (indicate cue type and reason): Pt without brace in room Lower Body Dressing: Supervision/safety;Sit to/from stand Lower Body Dressing Details (indicate cue type and reason): donning/doffing socks by bringing ankles to knees. Toilet Transfer: Supervision/safety;Ambulation(Simulated to recliner)           Functional mobility during ADLs: Min guard General ADL Comments: Providing education on back precautions, bed mobility, LB dressing, and toileting     Vision         Perception     Praxis      Pertinent Vitals/Pain Pain Assessment: Faces Faces Pain Scale: Hurts even more Pain Location: Lower back Pain Descriptors / Indicators: Discomfort;Grimacing;Constant Pain Intervention(s): Limited activity within patient's tolerance;Monitored during session;Premedicated before session;Repositioned;Patient requesting pain meds-RN notified     Hand Dominance     Extremity/Trunk Assessment Upper Extremity Assessment Upper Extremity Assessment: Overall WFL for tasks assessed   Lower Extremity Assessment Lower Extremity Assessment: Overall WFL for tasks assessed   Cervical / Trunk Assessment Cervical / Trunk Assessment: Other exceptions;Kyphotic Cervical / Trunk Exceptions: s/p lumbar sx   Communication Communication Communication: No difficulties   Cognition Arousal/Alertness: Awake/alert Behavior During Therapy: WFL for tasks assessed/performed Overall Cognitive Status: Within Functional Limits for tasks assessed  General Comments  Wife present throughout session    Exercises     Shoulder [place] expects to be discharged to:: Private residence Living Arrangements: Spouse/significant other Available Help at Discharge: Family;Available 24 hours/day Type of Home: House       Home Layout: One level     Bathroom Shower/Tub: Occupational psychologist: Standard     Home Equipment: None          Prior Functioning/Environment Level of Independence: Independent                 OT Problem List: Decreased activity tolerance;Impaired balance (sitting and/or standing);Decreased knowledge of precautions;Decreased knowledge of use of DME or AE;Pain      OT Treatment/Interventions: Self-care/ADL training;Therapeutic exercise;Energy conservation;DME and/or AE instruction;Therapeutic activities;Patient/family education    OT Goals(Current goals can be found in the care plan section) Acute Rehab OT Goals Patient Stated Goal: "Stop pain." OT Goal Formulation: With patient Time For Goal Achievement: 09/20/17 Potential to Achieve Goals: Good ADL Goals Pt Will Perform Lower Body Dressing: with set-up;with supervision;sit to/from stand Additional ADL Goal #1: Pt will don/doff brace independently  OT Frequency: Min 1X/week   Barriers to D/C:            Co-evaluation              AM-PAC PT "6 Clicks" Daily Activity     Outcome Measure Help from another person eating meals?: None Help from another person taking care of personal grooming?: None Help from another person toileting, which includes using toliet, bedpan, or urinal?: A Little Help from another person bathing (including washing, rinsing, drying)?: A Little Help from another person to put on and taking off regular upper body clothing?: None Help from another person to put on and taking off regular lower body clothing?: A Little 6 Click Score: 21   End of Session Equipment Utilized During Treatment: Gait belt Nurse Communication: Mobility status;Precautions;Other  (comment)(No brace in room)  Activity Tolerance: Patient tolerated treatment well Patient left: in bed;with call bell/phone within reach;with family/visitor present  OT Visit Diagnosis: Unsteadiness on feet (R26.81);Other abnormalities of gait and mobility (R26.89);Muscle weakness (generalized) (M62.81);Other symptoms and signs involving cognitive function;Pain Pain - part of body: (Back)                Time: 0258-5277 OT Time Calculation (min): 18 min Charges:  OT General Charges $OT Visit: 1 Visit OT Evaluation $OT Eval Low Complexity: 1 Low G-Codes:     Shaylene Paganelli MSOT, OTR/L Acute Rehab Pager: [phone] Office: [place] 09/06/2017, 4:16 PM

## 2017-09-06 NOTE — H&P (Signed)
Max Byrd is an 62 y.o. male.   Chief Complaint: Back pain HPI: 62 year old male with chronic and progressively worsening lower back pain with radiation to both lower extremities.  Pain has failed all conservative management.  Pain radiates in an L5 and S1 radicular pattern.'s associated with some numbness but no weakness.  Is having no bowel or bladder dysfunction.  Work-up demonstrates evidence of marked disc degeneration with degenerative spondylolisthesis at L5-S1 with severe facet arthropathy and severe foraminal stenosis.  Patient presents now for lumbar decompression and fusion in hopes of improving his symptoms.  Past Medical History:  Diagnosis Date  . Anginal pain ([place]) 2015   "since valve was done"  . Anxiety   . Arthritis    "left pointer finger; forminal openings bilaterally" (08/10/2013)  . Bicuspid aortic valve    a. 01/2013 s/p AVR - 31mm Edwards 3300 TFX pericardial tissue valve, ser # [id number].  Marland Kitchen CKD (chronic kidney disease) stage 2, GFR 60-89 ml/min   . Coronary atherosclerosis of native coronary artery    a. 01/2013 CABGx3: LIMA->LAD, VG->Diag, VG->OM (performed @ time of AVR),  occluded SVG-OM2 , now with 95% stenosis at the anastomosis site on OM 2 - PCI attempt 07/28/13 unsuccessful - med Rx cont'd   . Depression   . Erectile dysfunction    INJECTIONS OF PROSTAGLANDIN BY UROLGIST  . Essential hypertension, benign   . GERD (gastroesophageal reflux disease)   . H/O Bell's palsy 2010 X2  . Heart murmur   . History of blood transfusion    "related to stomach bleeding from ASA & NSAIDS  . History of stomach ulcers   . Hyperlipidemia   . IBS (irritable bowel syndrome)   . Migraine    "went away in the 1990's"  . Pain, chronic postoperative    S/P LEFT ARM SURGERY  . Renal cell carcinoma of right kidney ([place]) 2003    Past Surgical History:  Procedure Laterality Date  . ANAL SPHINCTEROTOMY  04/13/03   DR. GRAPEY  . AORTIC VALVE REPLACEMENT N/A 02/14/2013   Procedure: AORTIC VALVE REPLACEMENT (AVR);  Surgeon: Ivin Poot, MD;  Location: [facility];  Service: Open Heart Surgery;  Laterality: N/A;  . BUNIONECTOMY     right  02/2009  . CARDIAC CATHETERIZATION  2010; 6/12015; 07/29/2013  . CARDIAC VALVE REPLACEMENT    . COLONOSCOPY  2008   DIVERTICULOSIS  . CORONARY ANGIOPLASTY WITH STENT PLACEMENT  08/10/2013   "1"  . CORONARY ARTERY BYPASS GRAFT N/A 02/14/2013   Procedure: CORONARY ARTERY BYPASS GRAFTING (CABG);  Surgeon: Ivin Poot, MD;  Location: [facility];  Service: Open Heart Surgery;  Laterality: N/A;  . ELBOW SURGERY Left X 2  . ELBOW SURGERY Right X 2  . ELBOW SURGERY     left  2009  . EXCISIONAL HEMORRHOIDECTOMY    . HERNIA REPAIR Left 1957  . HERNIA REPAIR     06/21/17  . INTRAOPERATIVE TRANSESOPHAGEAL ECHOCARDIOGRAM N/A 02/14/2013   Procedure: INTRAOPERATIVE TRANSESOPHAGEAL ECHOCARDIOGRAM;  Surgeon: Ivin Poot, MD;  Location: [facility];  Service: Open Heart Surgery;  Laterality: N/A;  . LATERAL EPICONDYLE RELEASE Left 07/29/07   DR. GRAVES  . LEFT AND RIGHT HEART CATHETERIZATION WITH CORONARY ANGIOGRAM N/A 01/27/2013   Procedure: LEFT AND RIGHT HEART CATHETERIZATION WITH CORONARY ANGIOGRAM;  Surgeon: Sinclair Grooms, MD;  Location: [facility] CATH LAB;  Service: Cardiovascular;  Laterality: N/A;  . LEFT HEART CATHETERIZATION WITH CORONARY/GRAFT ANGIOGRAM N/A 07/28/2013   Procedure: LEFT  HEART CATHETERIZATION WITH Beatrix Fetters;  Surgeon: Leonie Man, MD;  Location: [facility] CATH LAB;  Service: Cardiovascular;  Laterality: N/A;  . PARTIAL NEPHRECTOMY  08/24/02   RIGHT...DR. Risa Grill  . PERCUTANEOUS STENT INTERVENTION N/A 08/10/2013   Procedure: PERCUTANEOUS STENT INTERVENTION;  Surgeon: Sinclair Grooms, MD;  Location: [facility] C[facility] LAB;  Service: Cardiovascular;  Laterality: N/A;  . TOE SURGERY Right    "shaved; wired back straight"  . TONSILLECTOMY      Family History  Problem Relation Age of Onset  . Cancer Mother         BREAST/OVARIAN  . Heart disease Father        S/P MI/CABG   Social History:  reports that he quit smoking about 41 years ago. His smoking use included cigarettes. He started smoking about 47 years ago. He has a 9.00 pack-year smoking history. His smokeless tobacco use includes snuff. He reports that he drinks alcohol. He reports that he does not use drugs.  Allergies:  Allergies  Allergen Reactions  . Aspirin Other (See Comments)    H/O BLEEDING ULCER  . Nsaids Other (See Comments)    Stomach bleeding  . Tolmetin Other (See Comments)    Stomach bleeding  . Zoloft [Sertraline Hcl] Other (See Comments)    FELT TERRIBLE  . Klonopin [Clonazepam] Other (See Comments)    Erectile dysfunction  . Beta Adrenergic Blockers Nausea Only    Metoprolol  . Penicillins Nausea And Vomiting and Other (See Comments)    Has patient had a PCN reaction causing immediate rash, facial/tongue/throat swelling, SOB or lightheadedness with hypotension: No Has patient had a PCN reaction causing severe rash involving mucus membranes or skin necrosis: No Has patient had a PCN reaction that required hospitalization: No Has patient had a PCN reaction occurring within the last 10 years: No If all of the above answers are "NO", then may proceed with Cephalosporin use.   . Reglan [Metoclopramide] Anxiety    EXCITABILITY     Medications Prior to Admission  Medication Sig Dispense Refill  . ALPRAZolam (XANAX) 1 MG tablet Take 1 mg by mouth 4 (four) times daily.    Marland Kitchen amLODipine (NORVASC) 5 MG tablet Take 1 tablet (5 mg total) by mouth daily. 90 tablet 2  . carisoprodol (SOMA) 350 MG tablet Take 350 mg by mouth 2 (two) times daily as needed for muscle spasms.     . Diclofenac-Misoprostol (ARTHROTEC) 50-0.2 MG TBEC Take 1 tablet by mouth 2 (two) times daily.    . DULoxetine (CYMBALTA) 30 MG capsule Take 30 mg by mouth 2 (two) times daily.    . furosemide (LASIX) 40 MG tablet TAKE ONE TABLET BY MOUTH ONCE DAILY  (Patient taking differently: Take 40 to 60 mg as needed for sweeling) 30 tablet 10  . hyoscyamine (LEVSIN SL) 0.125 MG SL tablet Take 0.25 mg by mouth every 6 (six) hours as needed (IBS).     Marland Kitchen irbesartan (AVAPRO) 150 MG tablet Take 1 tablet (150 mg total) by mouth daily. 90 tablet 3  . loperamide (IMODIUM A-D) 2 MG tablet Take 4 mg by mouth 3 (three) times daily as needed for diarrhea or loose stools.    . Naphazoline HCl (CLEAR EYES OP) Place 1 drop into both eyes 3 (three) times daily.    . nitroGLYCERIN (NITROSTAT) 0.4 MG SL tablet Place 1 tablet (0.4 mg total) under the tongue every 5 (five) minutes as needed for chest pain (MAX 3 TABLETS). 25 tablet  0  . oxyCODONE (OXYCONTIN) 20 mg 12 hr tablet Take 20 mg by mouth every 12 (twelve) hours.    Marland Kitchen oxyCODONE-acetaminophen (PERCOCET) 10-325 MG tablet Take 1 tablet by mouth every 8 (eight) hours.     . sertraline (ZOLOFT) 100 MG tablet Take 100 mg by mouth daily.     Marland Kitchen testosterone cypionate (DEPOTESTOSTERONE CYPIONATE) 200 MG/ML injection Inject 200 mg once every 14 days  3  . zolpidem (AMBIEN) 10 MG tablet Take 10 mg by mouth at bedtime.   5  . aspirin EC 81 MG EC tablet Take 1 tablet (81 mg total) by mouth daily. (Patient not taking: Reported on 08/30/2017)      Results for orders placed or performed during the hospital encounter of 09/06/17 (from the past 48 hour(s))  Type and screen [place]     Status: None   Collection Time: 09/06/17  7:33 AM  Result Value Ref Range   ABO/RH(D) A POS    Antibody Screen POS    Sample Expiration      09/09/2017 Performed at [facility], [place] [address], [place], [place] [address]    No results found.  Pertinent items noted in HPI and remainder of comprehensive ROS otherwise negative.  Blood pressure 129/79, pulse 72, temperature 97.9 F (36.6 C), temperature source Oral, resp. rate 18, height 5\' 6"  (1.676 m), weight 73.9 kg (163 lb), SpO2 100 %.  Patient is awake and  alert.  He is oriented and appropriate.  Speech is fluent.  Cranial nerve function intact.  Motor and sensory function extremities normal.  Reflexes hypoactive in both lower extremities.  Achilles reflexes absent.  No evidence of long track signs.  Gait antalgic.  Posture moderately flexed.  Summation head ears eyes nose throat is unremarkable her chest and abdomen are benign.  Extremities are free from injury deformity. Assessment/Plan L5-S1 spondylolisthesis with foraminal stenosis.  Plan bilateral L5-S1 decompressive laminotomies and foraminotomies followed by posterior lumbar interbody fusion utilizing interbody cages, locally harvested autograft, and augmented with posterior lateral arthrodesis utilizing nonsegmental pedicle screw fixation and local autografting.  Risks and benefits of been explained.  Patient wishes to proceed.  Mallie Mussel A Kym Fenter 09/06/2017, 9:28 AM

## 2017-09-06 NOTE — Transfer of Care (Signed)
Immediate Anesthesia Transfer of Care Note  Patient: Max Byrd  Procedure(s) Performed: POSTERIOR LUMBAR INTERBODY FUSION LUMBAR FIVE-SACRAL ONE. (N/A Spine Lumbar)  Patient Location: PACU  Anesthesia Type:General  Level of Consciousness: drowsy  Airway & Oxygen Therapy: Patient Spontanous Breathing and Patient connected to face mask oxygen  Post-op Assessment: Report given to RN and Post -op Vital signs reviewed and stable  Post vital signs: Reviewed and stable  Last Vitals:  Vitals Value Taken Time  BP 133/82 09/06/2017 12:15 PM  Temp    Pulse 72 09/06/2017 12:16 PM  Resp 13 09/06/2017 12:18 PM  SpO2 100 % 09/06/2017 12:16 PM  Vitals shown include unvalidated device data.  Last Pain:  Vitals:   09/06/17 0749  TempSrc:   PainSc: 6          Complications: No apparent anesthesia complications

## 2017-09-06 NOTE — Op Note (Signed)
Date of procedure: 09/06/2017  Date of dictation: Same  Service: Neurosurgery  Preoperative diagnosis: Grade 2 L5-S1 spondylolisthesis with severe foraminal stenosis  Postoperative diagnosis: Same  Procedure Name: L5-S1 decompressive laminectomy with bilateral L5 and S1 decompressive foraminotomies, more than would be required for simple interbody fusion alone.  L5-S1 posterior lumbar interbody fusion utilizing interbody cages, locally harvested autograft,  L5-S1 posterior lateral arthrodesis utilizing nonsegmental pedicle screw fixation and local autograft  Surgeon:Elgar Scoggins A.Jenne Sellinger, M.D.  Asst. Surgeon: Christella Noa  Anesthesia: General  Indication: 62 year old male with severe back and bilateral lower extremity symptoms failing conservative management.  Work-up demonstrates evidence of a grade 1/grade 2 L5-S1 degenerative spondylolisthesis with severe foraminal collapse and foraminal stenosis with exiting L5 nerve root compression.  Patient is failed conservative management.  He presents now for decompression and fusion surgery in hopes of improving his symptoms.  Operative note: After induction of anesthesia, patient position prone on the Wilson frame and properly padded.  Lumbar region prepped and draped sterilely.  Incision made overlying L5-S1.  Dissection performed bilaterally.  Retractor placed.  Fluoroscopy used.  Level confirmed.  Complete laminectomy of L5 was then performed using [place] and high-speed drill.  Inferior facetectomies of L5 were performed bilaterally.  Superior facetectomies S1 were performed bilaterally.  The superior rim of the S1 lamina was removed.  Ligament flavum was elevated and resected.  Foraminotomies were completed on the course exiting L5 and S1 nerve roots.  Bilateral discectomies then performed at L5-S1.  The space then prepared for interbody fusion.  With a distractor placed the patient's right side the space was further cleaned of soft  tissue and a 9 mm extra lordotic expandable cage from [employer] packed with locally harvested autograft was impacted in place and expanded to its full extent.  Distractor removed patient's right side.  The space prepared on the right side.  Morselized autograft packed in the interspace.  A second cage packed with autograft was then impacted in place and expanded to its full extent.  Pedicles of L5 and S1 were identified using surface landmarks and intraoperative fluoroscopy.  Superficial bone was removed overlying the pedicle.  Each pedicle was then probed using a pedicle awl.  Each pedicle tract was probed and found to be solidly within the bone.  Each pedicle all track was then tapped with a screw tapped.  Each screw table was probed and found to be solidly within the bone.  5.75 mm radius bran screws from [employer] were placed bilaterally at L5 and S1.  Final images reveal good position of the hardware and cages the proper upper level with normal alignment spine.  Wound was irrigated one final time.  Transverse processes and lateral facets were decorticated.  Morselized autograft was packed posterior laterally for later fusion.  Short segment titanium rod placed over the screw heads at L5 and S1.  Locking caps placed over the screws and locking caps then engaged with a construct under mild compression.  Final wound was then irrigated one final time.  Gelfoam was placed topically for hemostasis.  Vancomycin powder was placed in the deep wound space.  Wounds and closed in layers of Vicryl sutures.  Steri-Strips sterile dressing were applied.  No apparent complications.  Patient tolerated the procedure well and he returns to the recovery room postop.

## 2017-09-06 NOTE — Anesthesia Procedure Notes (Signed)
Procedure Name: Intubation Date/Time: 09/06/2017 10:14 AM Performed by: Teressa Lower., CRNA Pre-anesthesia Checklist: Patient identified, Emergency Drugs available, Suction available and Patient being monitored Patient Re-evaluated:Patient Re-evaluated prior to induction Oxygen Delivery Method: Circle system utilized Preoxygenation: Pre-oxygenation with 100% oxygen Induction Type: IV induction Ventilation: Mask ventilation without difficulty and Oral airway inserted - appropriate to patient size Laryngoscope Size: Sabra Heck and 2 Grade View: Grade I Tube type: Oral Tube size: 7.5 mm Number of attempts: 1 Airway Equipment and Method: Stylet and Oral airway Placement Confirmation: ETT inserted through vocal cords under direct vision,  positive ETCO2 and breath sounds checked- equal and bilateral Secured at: 22 cm Tube secured with: Tape Dental Injury: Teeth and Oropharynx as per pre-operative assessment

## 2017-09-06 NOTE — Progress Notes (Addendum)
Notified Rolla Flatten, RN, AD of suicide assessment results. I educated patient on plan related to suicide assessment. Patient stated, "I am not suicidal and do not want to kill myself." Patient further stated that, "If I wanted to kill myself I would not be here getting ready to go through massive surgery with extensive recovery. I simply want the pain to go away. What reason do I have to tell the truth in the future if it is just going to create a big problem. I think every process in getting ready for surgery is designed to delay and or cancel surgery."  I told patient that I did not anticipate any delay of surgery.  Notified ER Social Working regarding assessment results. They requested we place a consult for Therapeutic Triage Services. Notified Rolla Flatten, RN and I am waiting further instructions.

## 2017-09-07 MED FILL — Thrombin (Recombinant) For Soln 20000 Unit: CUTANEOUS | Qty: 1 | Status: AC

## 2017-09-07 MED FILL — Sodium Chloride IV Soln 0.9%: INTRAVENOUS | Qty: 1000 | Status: AC

## 2017-09-07 MED FILL — Heparin Sodium (Porcine) Inj 1000 Unit/ML: INTRAMUSCULAR | Qty: 30 | Status: AC

## 2017-09-07 NOTE — Progress Notes (Signed)
Patient is discharged from room 3C08 at this time. Alert and in stable condition. IV site d/c'[d and instructions read to patient and spouse with understanding verbalized. Left unit via wheelchair with all belongings at side. 

## 2017-09-07 NOTE — Discharge Instructions (Signed)

## 2017-09-07 NOTE — Progress Notes (Signed)
Occupational Therapy Treatment Patient Details Name: Max Byrd MRN: [id number] DOB: 1955/12/12 Today's Date: 09/07/2017    History of present illness 62 yo male s/p L5-S1 fusion. PMH including anxiety, CKD, and depression.   OT comments  Pt progressing towards established OT goals. Pt performing ADls and functional mobility at supervision level and demonstrating understanding of back precaution and compensatory techniques. Pt reporting that pain decreased when wearing back brace. All acute OT need met and will sign off. Continue to recommend dc home once medically stable per physician.    Follow Up Recommendations  No OT follow up;Supervision - Intermittent    Equipment Recommendations  None recommended by OT    Recommendations for Other Services      Precautions / Restrictions Precautions Precautions: Back Precaution Booklet Issued: Yes (comment) Precaution Comments: Pt able to recall 3/3 back precautions and demonstrating good adherance during ADLs Required Braces or Orthoses: Spinal Brace Spinal Brace: Lumbar corset;Other (comment)       Mobility Bed Mobility               General bed mobility comments: OOB upon arrival  Transfers Overall transfer level: Needs assistance Equipment used: None Transfers: Sit to/from Stand Sit to Stand: Supervision         General transfer comment: supervision for safety    Balance Overall balance assessment: No apparent balance deficits (not formally assessed)                                         ADL either performed or assessed with clinical judgement   ADL Overall ADL's : Needs assistance/impaired     Grooming: Standing;Oral care;Modified independent Grooming Details (indicate cue type and reason): Pt demosntrating understanding of compensatory techniques       Lower Body Bathing Details (indicate cue type and reason): Reveiwed bathing and that bandage is water proof Upper Body Dressing :  Supervision/safety;Standing Upper Body Dressing Details (indicate cue type and reason): Pt donning shirt and brace with supervision for safety dmeonstrating good management Lower Body Dressing: Supervision/safety;Sit to/from stand Lower Body Dressing Details (indicate cue type and reason): Pt donning pants and socks with good technique and supervision for safety Toilet Transfer: (Simulated to recliner)           Functional mobility during ADLs: Supervision/safety General ADL Comments: Reveiwed all back precautions and compensatory techniques for dc home later today. Pt demonstrating understanding     Vision       Perception     Praxis      Cognition Arousal/Alertness: Awake/alert Behavior During Therapy: WFL for tasks assessed/performed Overall Cognitive Status: Within Functional Limits for tasks assessed                                          Exercises     Shoulder Instructions       General Comments Wife present throughout    Pertinent Vitals/ Pain       Pain Assessment: Faces Faces Pain Scale: Hurts little more Pain Location: Lower back Pain Descriptors / Indicators: Discomfort;Grimacing;Constant Pain Intervention(s): Monitored during session;Limited activity within patient's tolerance;Repositioned  Home Living  Prior Functioning/Environment              Frequency  Min 1X/week        Progress Toward Goals  OT Goals(current goals can now be found in the care plan section)  Progress towards OT goals: Goals met/education completed, patient discharged from OT  Acute Rehab OT Goals Patient Stated Goal: "Stop pain." OT Goal Formulation: With patient Time For Goal Achievement: 09/20/17 Potential to Achieve Goals: Good ADL Goals Pt Will Perform Lower Body Dressing: with set-up;with supervision;sit to/from stand Additional ADL Goal #1: Pt will don/doff brace independently   Plan Discharge plan remains appropriate    Co-evaluation                 AM-PAC PT "6 Clicks" Daily Activity     Outcome Measure   Help from another person eating meals?: None Help from another person taking care of personal grooming?: None Help from another person toileting, which includes using toliet, bedpan, or urinal?: None Help from another person bathing (including washing, rinsing, drying)?: None Help from another person to put on and taking off regular upper body clothing?: None Help from another person to put on and taking off regular lower body clothing?: None 6 Click Score: 24    End of Session Equipment Utilized During Treatment: Back brace  OT Visit Diagnosis: Unsteadiness on feet (R26.81);Other abnormalities of gait and mobility (R26.89);Muscle weakness (generalized) (M62.81);Other symptoms and signs involving cognitive function;Pain Pain - part of body: (Back)   Activity Tolerance Patient tolerated treatment well   Patient Left with call bell/phone within reach;with family/visitor present;with nursing/sitter in room(OOB with MD)   Nurse Communication Mobility status        Time: 3976-7341 OT Time Calculation (min): 9 min  Charges: OT General Charges $OT Visit: 1 Visit OT Treatments $Self Care/Home Management : 8-22 mins  [employer], OTR/L Acute Rehab Pager: [phone] Office: [place] 09/07/2017, 8:09 AM

## 2017-09-07 NOTE — Discharge Summary (Signed)
Physician Discharge Summary  Patient ID: Max Byrd MRN: [id number] DOB/AGE: [birth year] 62 y.o.  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  Postoperatively doing very well.  Preoperative back and lower extremity pain much improved.  Standing and walking better.  Ready for discharge home.  Consults:   Significant Diagnostic Studies:   Treatments:   Discharge Exam: Blood pressure 107/72, pulse 85, temperature 98 F (36.7 C), temperature source Oral, resp. rate 16, height 5\' 6"  (1.676 m), weight 73.9 kg (163 lb), SpO2 100 %.   Disposition: Discharge disposition: 01-Home or Self Care        Allergies as of 09/07/2017      Reactions   Aspirin Other (See Comments)   H/O BLEEDING ULCER   Nsaids Other (See Comments)   Stomach bleeding   Tolmetin Other (See Comments)   Stomach bleeding   Zoloft [sertraline Hcl] Other (See Comments)   FELT TERRIBLE   Klonopin [clonazepam] Other (See Comments)   Erectile dysfunction   Beta Adrenergic Blockers Nausea Only   Metoprolol   Penicillins Nausea And Vomiting, Other (See Comments)   Has patient had a PCN reaction causing immediate rash, facial/tongue/throat swelling, SOB or lightheadedness with hypotension: No Has patient had a PCN reaction causing severe rash involving mucus membranes or skin necrosis: No Has patient had a PCN reaction that required hospitalization: No Has patient had a PCN reaction occurring within the last 10 years: No If all of the above answers are "NO", then may proceed with Cephalosporin use.   Reglan [metoclopramide] Anxiety   EXCITABILITY      Medication List    TAKE these medications   ALPRAZolam 1 MG tablet Commonly known as:  XANAX Take 1 mg by mouth  4 (four) times daily.   amLODipine 5 MG tablet Commonly known as:  NORVASC Take 1 tablet (5 mg total) by mouth daily.   ARTHROTEC 50-0.2 MG Tbec Generic drug:  Diclofenac-miSOPROStol Take 1 tablet by mouth 2 (two) times daily.   aspirin 81 MG EC tablet Take 1 tablet (81 mg total) by mouth daily.   carisoprodol 350 MG tablet Commonly known as:  SOMA Take 350 mg by mouth 2 (two) times daily as needed for muscle spasms.   CLEAR EYES OP Place 1 drop into both eyes 3 (three) times daily.   DULoxetine 30 MG capsule Commonly known as:  CYMBALTA Take 30 mg by mouth 2 (two) times daily.   furosemide 40 MG tablet Commonly known as:  LASIX TAKE ONE TABLET BY MOUTH ONCE DAILY What changed:    how much to take  how to take this  when to take this   hyoscyamine 0.125 MG SL tablet Commonly known as:  LEVSIN SL Take 0.25 mg by mouth every 6 (six) hours as needed (IBS).   irbesartan 150 MG tablet Commonly known as:  AVAPRO Take 1 tablet (150 mg total) by mouth daily.   loperamide 2 MG tablet Commonly known as:  IMODIUM A-D Take 4 mg by mouth 3 (three) times daily as needed for diarrhea or loose stools.   nitroGLYCERIN 0.4 MG SL tablet Commonly known as:  NITROSTAT Place 1 tablet (0.4 mg total) under the tongue every 5 (five) minutes as needed for chest pain (MAX 3 TABLETS).   oxyCODONE 20  mg 12 hr tablet Commonly known as:  OXYCONTIN Take 20 mg by mouth every 12 (twelve) hours.   oxyCODONE-acetaminophen 10-325 MG tablet Commonly known as:  PERCOCET Take 1 tablet by mouth every 8 (eight) hours.   sertraline 100 MG tablet Commonly known as:  ZOLOFT Take 100 mg by mouth daily.   testosterone cypionate 200 MG/ML injection Commonly known as:  DEPOTESTOSTERONE CYPIONATE Inject 200 mg once every 14 days   zolpidem 10 MG tablet Commonly known as:  AMBIEN Take 10 mg by mouth at bedtime.            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

## 2017-09-07 NOTE — Evaluation (Signed)
Physical Therapy Evaluation and Discharge Patient Details Name: Max Byrd MRN: [id number] DOB: 02-14-1956 Today's Date: 09/07/2017   History of Present Illness  62 yo male s/p L5-S1 fusion. PMH including anxiety, CKD, and depression.  Clinical Impression  Pt admitted with above. Pt functioning at mod I/supervision level. Pt with good home set up and support. Wife returned demonstrated how to properly assist pt on the stairs as they have no handrails at home. Pt and spouse with good recall and adherence to back precautions. Pt with no further acute PT needs at this time. Please re-consult if needed in future. PT SIGNING OFF.    Follow Up Recommendations No PT follow up;Supervision - Intermittent    Equipment Recommendations  None recommended by PT    Recommendations for Other Services       Precautions / Restrictions Precautions Precautions: Back Precaution Booklet Issued: Yes (comment) Precaution Comments: Pt able to recall 3/3 back precautions and demonstrating good adherance during ADLs Required Braces or Orthoses: Spinal Brace Spinal Brace: Lumbar corset;Other (comment) Spinal Brace Comments: pt able to don in standing independently Restrictions Weight Bearing Restrictions: No      Mobility  Bed Mobility Overal bed mobility: Modified Independent             General bed mobility comments: verbal cues for log roll technique to minimize twisting, no physical assist  Transfers Overall transfer level: Needs assistance Equipment used: None Transfers: Sit to/from Stand Sit to Stand: Supervision         General transfer comment: pt able to rely soley on LEs  Ambulation/Gait Ambulation/Gait assistance: Supervision Gait Distance (Feet): 250 Feet Assistive device: None Gait Pattern/deviations: Step-through pattern;Decreased stance time - left;Decreased stride length;Decreased weight shift to left Gait velocity: wfl for surgery Gait velocity interpretation: >2.62  ft/sec, indicative of community ambulatory General Gait Details: pt with kyphosis and inability to achieve full upright posture due to 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  Stairs Stairs: Yes Stairs assistance: Min assist Stair Management: No rails;Step to pattern(instructed wife how to provide hand held assist due to no HR) Number of Stairs: 10 General stair comments: pt with 5 STE and no handrails, wife returned demonstrated how to assist pt on the stairs via R hand held assist and staggering her base of support on the stairs to provide stability for patient  Wheelchair Mobility    Modified Rankin (Stroke Patients Only)       Balance Overall balance assessment: Mild deficits observed, not formally tested                                           Pertinent Vitals/Pain Pain Assessment: 0-10 Pain Score: 5 (1/10 at rest, 5/10 with movement) Faces Pain Scale: Hurts little more Pain Location: Lower back Pain Descriptors / Indicators: Discomfort;Grimacing;Constant Pain Intervention(s): Monitored during session    Home Living Family/patient expects to be discharged to:: Private residence Living Arrangements: Spouse/significant other Available Help at Discharge: Family;Available 24 hours/day Type of Home: House       Home Layout: One level Home Equipment: None      Prior Function Level of Independence: Independent               Hand Dominance        Extremity/Trunk Assessment   Upper Extremity Assessment Upper Extremity Assessment: Overall WFL for tasks assessed  Lower Extremity Assessment Lower Extremity Assessment: Overall WFL for tasks assessed    Cervical / Trunk Assessment Cervical / Trunk Assessment: Other exceptions;Kyphotic Cervical / Trunk Exceptions: s/p lumbar sx  Communication   Communication: No difficulties  Cognition Arousal/Alertness: Awake/alert Behavior During Therapy: WFL for  tasks assessed/performed Overall Cognitive Status: Within Functional Limits for tasks assessed                                        General Comments General comments (skin integrity, edema, etc.): incision with dressing intact    Exercises     Assessment/Plan    PT Assessment Patent does not need any further PT services  PT Problem List         PT Treatment Interventions      PT Goals (Current goals can be found in the Care Plan section)  Acute Rehab PT Goals Patient Stated Goal: "Stop pain." PT Goal Formulation: With patient/family Time For Goal Achievement: 09/21/17 Potential to Achieve Goals: Good    Frequency     Barriers to discharge        Co-evaluation               AM-PAC PT "6 Clicks" Daily Activity  Outcome Measure Difficulty turning over in bed (including adjusting bedclothes, sheets and blankets)?: None Difficulty moving from lying on back to sitting on the side of the bed? : None Difficulty sitting down on and standing up from a chair with arms (e.g., wheelchair, bedside commode, etc,.)?: None Help needed moving to and from a bed to chair (including a wheelchair)?: A Little Help needed walking in hospital room?: A Little Help needed climbing 3-5 steps with a railing? : A Little 6 Click Score: 21    End of Session Equipment Utilized During Treatment: Back brace Activity Tolerance: Patient tolerated treatment well Patient left: in chair;with call bell/phone within reach;with family/visitor present Nurse Communication: Mobility status PT Visit Diagnosis: Unsteadiness on feet (R26.81);Other abnormalities of gait and mobility (R26.89)    Time: 9518-8416 PT Time Calculation (min) (ACUTE ONLY): 16 min   Charges:   PT Evaluation $PT Eval Low Complexity: 1 Low     PT G Codes:       Kittie Plater, PT, DPT Pager #: [phone] Office #: [phone]   Muhsin Doris M Sabien Umland 09/07/2017, 9:49 AM

## 2017-09-08 LAB — BPAM RBC
BLOOD PRODUCT EXPIRATION DATE: 201908062359
Blood Product Expiration Date: 201908062359
UNIT TYPE AND RH: 6200
UNIT TYPE AND RH: 6200

## 2017-09-08 LAB — TYPE AND SCREEN
ABO/RH(D): A POS
Antibody Screen: POSITIVE
Donor AG Type: NEGATIVE
Donor AG Type: NEGATIVE
PT AG TYPE: NEGATIVE
UNIT DIVISION: 0
UNIT DIVISION: 0

## 2017-09-16 ENCOUNTER — Ambulatory Visit: Payer: Federal, State, Local not specified - PPO | Admitting: Interventional Cardiology

## 2017-10-04 ENCOUNTER — Ambulatory Visit: Payer: Federal, State, Local not specified - PPO | Admitting: Interventional Cardiology

## 2018-01-11 ENCOUNTER — Ambulatory Visit: Payer: Federal, State, Local not specified - PPO | Admitting: Interventional Cardiology

## 2018-01-17 IMAGING — RF DG UGI W/ HIGH DENSITY W/KUB
19 of 24 series · 19 of 24 positions shown · non-contrast
Comparison: CT abdomen pelvis of 10/31/2004

CLINICAL DATA: Abdominal pain, history of peptic ulcer disease

EXAM:
UPPER GI SERIES WITH KUB
TECHNIQUE: After obtaining a scout radiograph a routine upper GI series was
performed using thin and high density barium.
FLUOROSCOPY TIME:  Fluoroscopy Time:  2 minutes 36 seconds
Radiation Exposure Index (if provided by the fluoroscopic device):
52 deciGy per square cm
Number of Acquired Spot Images: 0

[Series 3: run · 1 of 1 slices shown (1 of 19)]
[im 1/1]
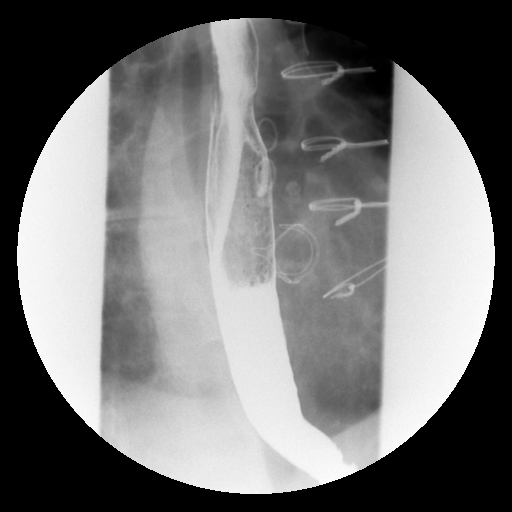

[Series 4: run · 1 of 1 slices shown (2 of 19)]
[im 1/1]
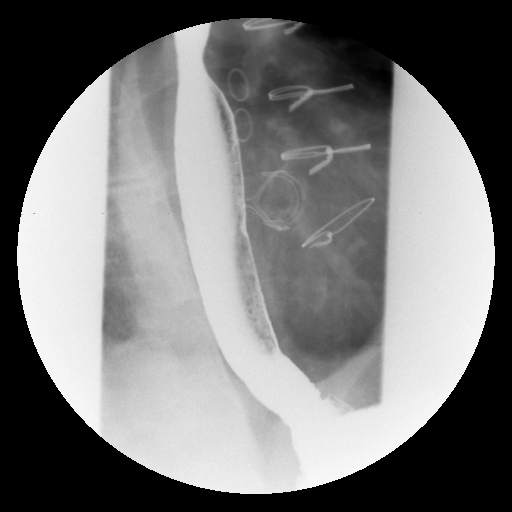

[Series 6: run · 1 of 1 slices shown (3 of 19)]
[im 1/1]
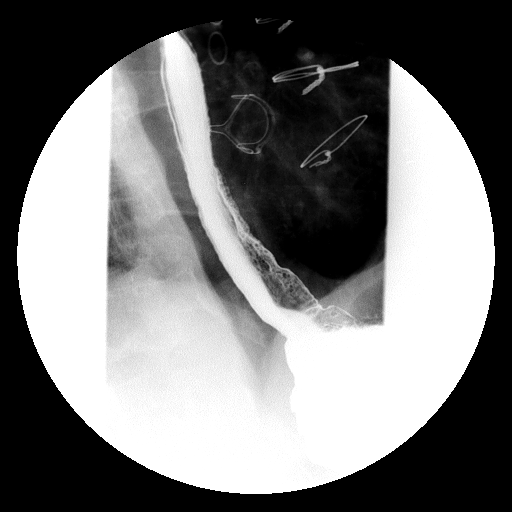

[Series 7: run · 1 of 1 slices shown (4 of 19)]
[im 1/1]
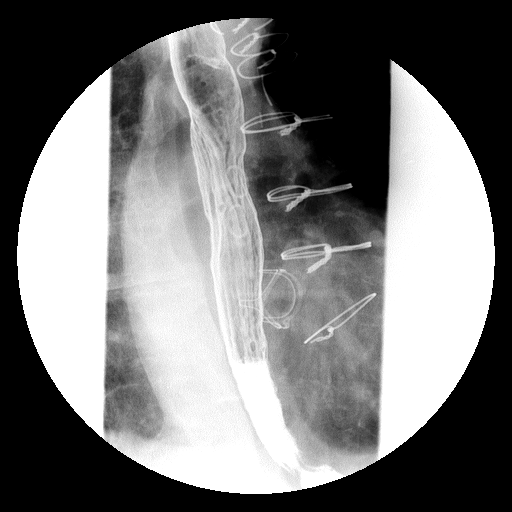

[Series 8: run · 1 of 1 slices shown (5 of 19)]
[im 1/1]
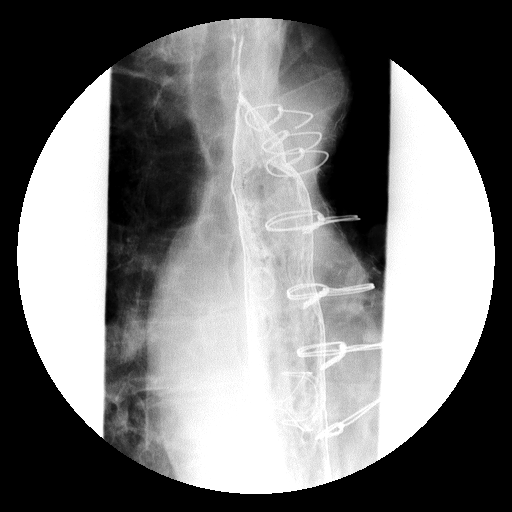

[Series 9: run · 1 of 1 slices shown (6 of 19)]
[im 1/1]
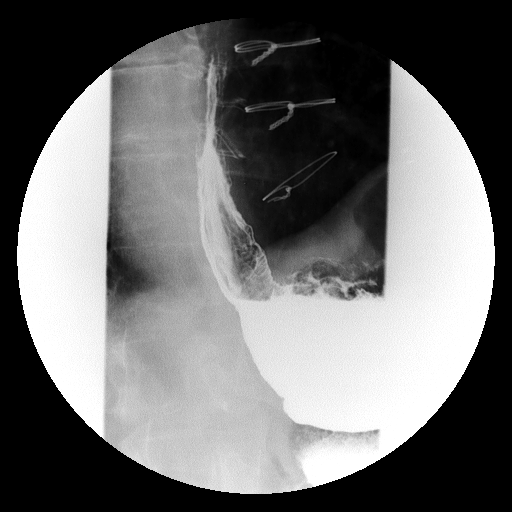

[Series 11: run · 1 of 1 slices shown (7 of 19)]
[im 1/1]
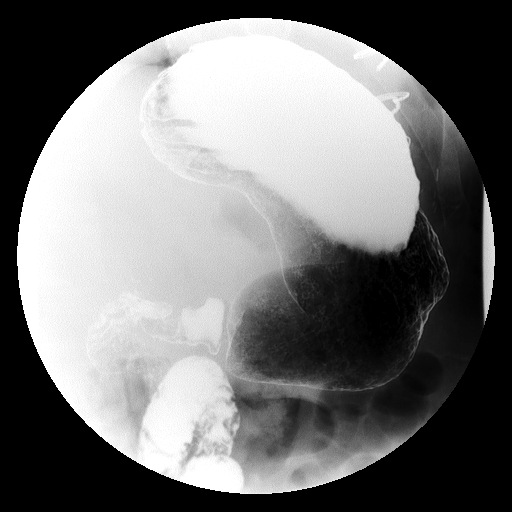

[Series 12: run · 1 of 1 slices shown (8 of 19)]
[im 1/1]
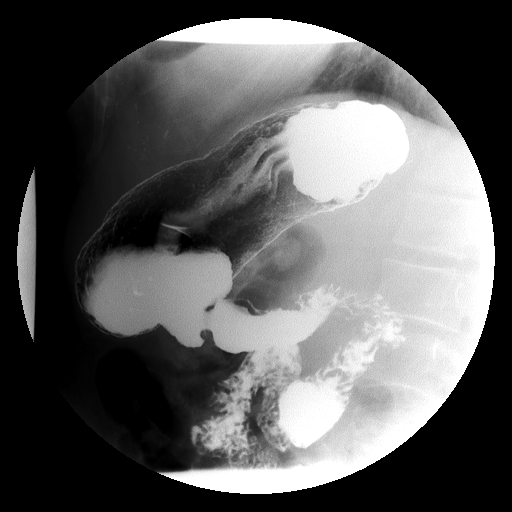

[Series 13: run · 1 of 1 slices shown (9 of 19)]
[im 1/1]
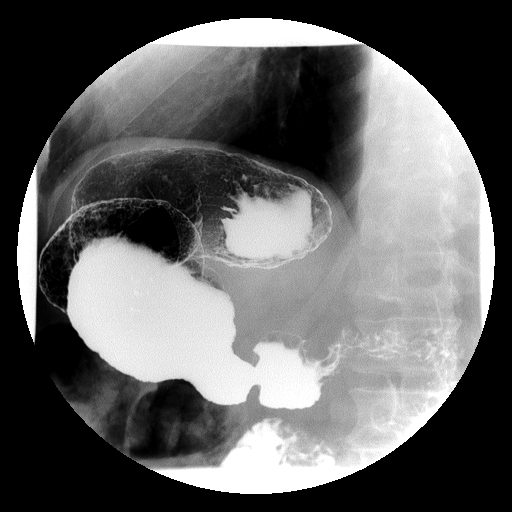

[Series 15: run · 1 of 2 slices shown (10 of 19)]
[im 1/2]
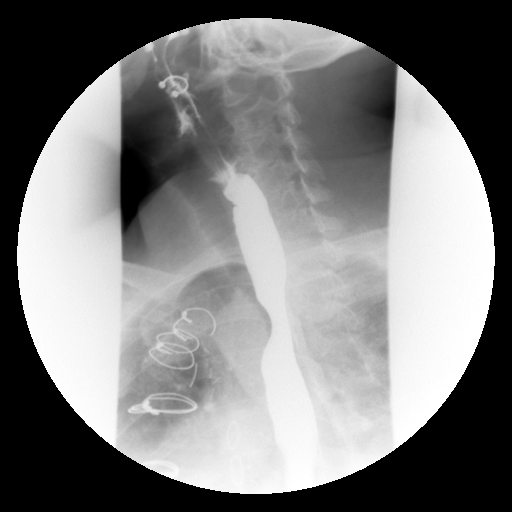

[Series 17: run · 1 of 1 slices shown (11 of 19)]
[im 1/1]
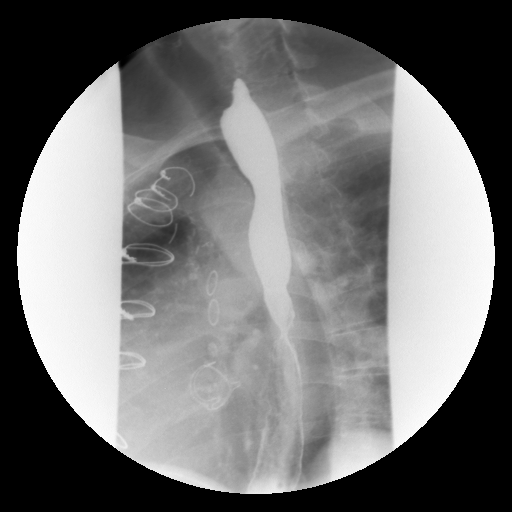

[Series 18: run · 1 of 1 slices shown (12 of 19)]
[im 1/1]
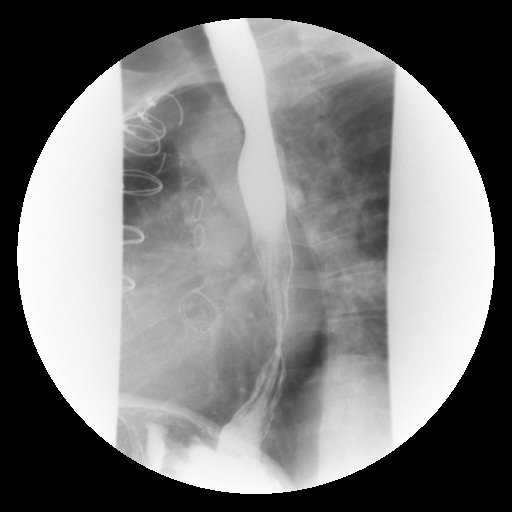

[Series 20: run · 1 of 1 slices shown (13 of 19)]
[im 1/1]
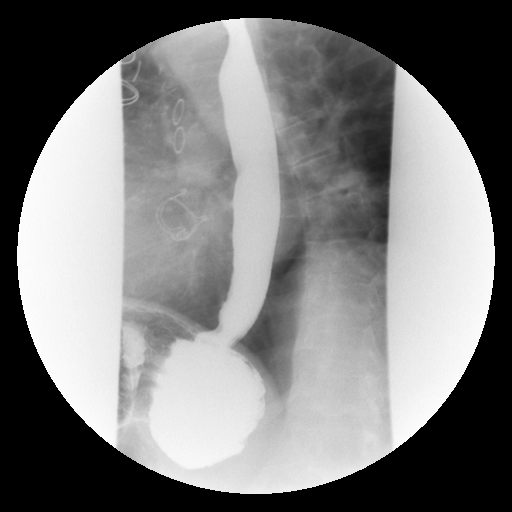

[Series 22: run · 1 of 1 slices shown (14 of 19)]
[im 1/1]
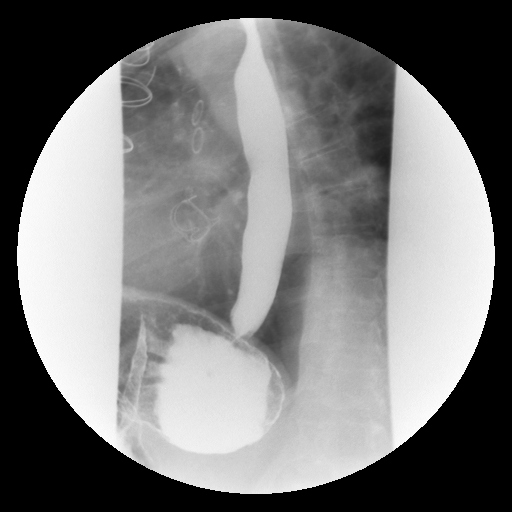

[Series 23: run · 1 of 1 slices shown (15 of 19)]
[im 1/1]
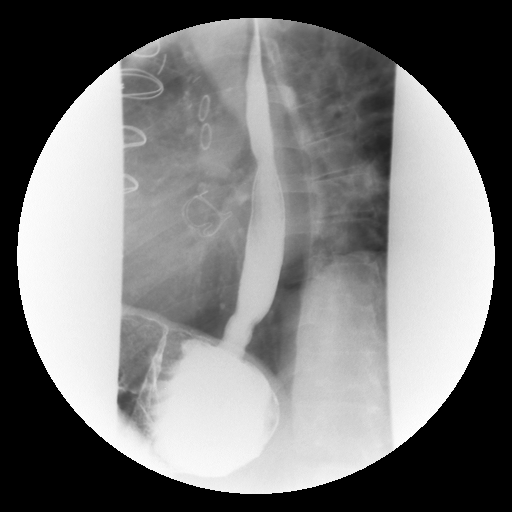

[Series 24: run · 1 of 1 slices shown (16 of 19)]
[im 1/1]
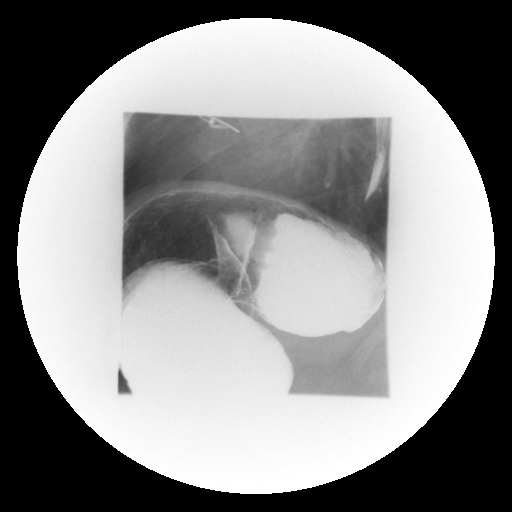

[Series 25: run · 1 of 1 slices shown (17 of 19)]
[im 1/1]
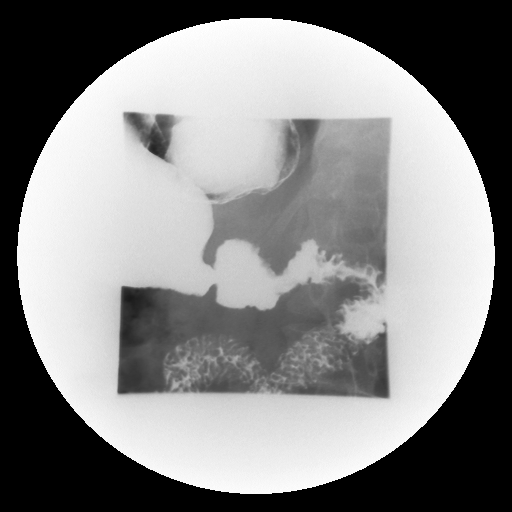

[Series 27: run · 1 of 1 slices shown (18 of 19)]
[im 1/1]
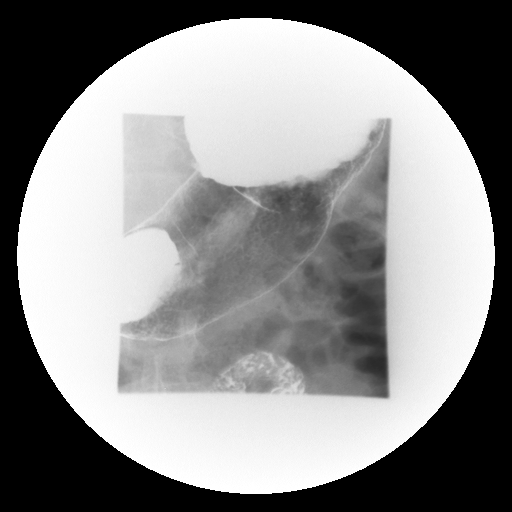

[Series 28: run · 1 of 1 slices shown (19 of 19)]
[im 1/1]
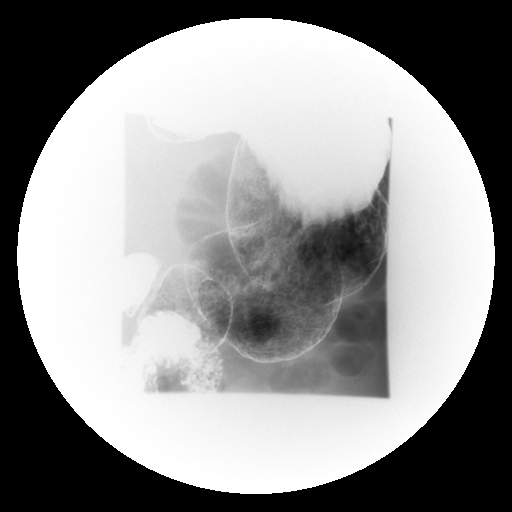

[19 of 24 positions shown; findings below may reference images not displayed]

FINDINGS: A supine film of the abdomen shows a nonspecific bowel gas pattern.
No opaque calculi are seen. No bony abnormality is evident other
than degenerative change at L5-S1.

A double-contrast upper GI was performed. The mucosa of the
esophagus is unremarkable. A single contrast study shows the
swallowing mechanism to be normal. There are tertiary contractions
in the mid and distal esophagus. No hiatal hernia is seen. Minimal
gastroesophageal reflux is demonstrated with the water siphon
maneuver. A barium pill was given at the end of the study. The
barium pill did lodge temporarily just above the gastroesophageal
junction but did pass after additional water and barium were given.
This could indicate short segment narrowing.

The stomach is normal in contour and peristalsis. The duodenal bulb
fills and the duodenal loop is in normal position.
IMPRESSION: 1. Mild gastroesophageal reflux. Barium pill does lodge just above
the gastroesophageal junction before passing, possibly indicating a
mild short segment narrowing of the distal esophagus.
2. No hiatal hernia.
3. Tertiary contractions in the distal esophagus.
4. No abnormality of the stomach or duodenum is seen.

## 2018-01-17 NOTE — Progress Notes (Signed)
Cardiology Office Note:    Date:  01/18/2018   ID:  Max Byrd, DOB [birth year], MRN [id number]  PCP:  Wenda Low, MD  Cardiologist:  Sinclair Grooms, MD   Referring MD: Wenda Low, MD   Chief Complaint  Patient presents with  . Cardiac Valve Problem  . Hypertension  . Shortness of Breath    History of Present Illness:    Max Byrd is a 62 y.o. male with a hx of chronic kidney disease, bicuspid aortic valve treated with pericardial tissue valve 2014, hypertension, CAD with angina, circumflex stent after failed saphenous vein graft to 2014, and depression. Recent spinal fusion.  The patient has multiple complaints.  He reiterates that since his surgery nearly 5 years ago he has never felt better.  States he has the same symptoms now as he had prior to surgery.  There are litany of complaints today.  2 years ago he was doing quite well, exercising regularly, and has significant improvement.  I reminded him of this and he cannot remember.  He is concerned about near fainting and fainting.  He states that on 3 occasions while playing his guitar that he suddenly develops a spinning dizziness that causes him to go to his knees.  There is no shortness of breath, chest pain, or palpitations associated.  He has to get down on his knees and wait for the sensation of spinning to go away.  This sounds different than what he had prior to surgery which were frank episodes of syncope that were felt to be related to aortic stenosis.  He denies buzzing and ringing in his ears.  He is having at least 3 different qualities of chest discomfort from time to time.  One is right upper chest discomfort that can last for hours that he describes as a throb.  He rarely has pressure in his chest for which she will take nitroglycerin.  The third type of discomfort is a sharp pain that shoots through his chest and lasts seconds.  He can be intermittent off and on throughout the day.  He is not sleeping well  because of back pain.  He is also having intermittent pain in his stomach area  He is concerned that he will die soon and feels that all of his current problems and complaints some of which are included in the review of systems are due to his heart valve going bad.  Past Medical History:  Diagnosis Date  . Anginal pain ([place]) 2015   "since valve was done"  . Anxiety   . Arthritis    "left pointer finger; forminal openings bilaterally" (08/10/2013)  . Bicuspid aortic valve    a. 01/2013 s/p AVR - 71mm Edwards 3300 TFX pericardial tissue valve, ser # [id number].  Marland Kitchen CKD (chronic kidney disease) stage 2, GFR 60-89 ml/min   . Coronary atherosclerosis of native coronary artery    a. 01/2013 CABGx3: LIMA->LAD, VG->Diag, VG->OM (performed @ time of AVR),  occluded SVG-OM2 , now with 95% stenosis at the anastomosis site on OM 2 - PCI attempt 07/28/13 unsuccessful - med Rx cont'd   . Depression   . Erectile dysfunction    INJECTIONS OF PROSTAGLANDIN BY UROLGIST  . Essential hypertension, benign   . GERD (gastroesophageal reflux disease)   . H/O Bell's palsy 2010 X2  . Heart murmur   . History of blood transfusion    "related to stomach bleeding from ASA & NSAIDS  . History of stomach  ulcers   . Hyperlipidemia   . IBS (irritable bowel syndrome)   . Migraine    "went away in the 1990's"  . Pain, chronic postoperative    S/P LEFT ARM SURGERY  . Renal cell carcinoma of right kidney ([place]) 2003    Past Surgical History:  Procedure Laterality Date  . ANAL SPHINCTEROTOMY  04/13/03   DR. GRAPEY  . AORTIC VALVE REPLACEMENT N/A 02/14/2013   Procedure: AORTIC VALVE REPLACEMENT (AVR);  Surgeon: Ivin Poot, MD;  Location: [place];  Service: Open Heart Surgery;  Laterality: N/A;  . BUNIONECTOMY     right  02/2009  . CARDIAC CATHETERIZATION  2010; 6/12015; 07/29/2013  . CARDIAC VALVE REPLACEMENT    . COLONOSCOPY  2008   DIVERTICULOSIS  . CORONARY ANGIOPLASTY WITH STENT PLACEMENT  08/10/2013   "1"  .  CORONARY ARTERY BYPASS GRAFT N/A 02/14/2013   Procedure: CORONARY ARTERY BYPASS GRAFTING (CABG);  Surgeon: Ivin Poot, MD;  Location: [facility];  Service: Open Heart Surgery;  Laterality: N/A;  . ELBOW SURGERY Left X 2  . ELBOW SURGERY Right X 2  . ELBOW SURGERY     left  2009  . EXCISIONAL HEMORRHOIDECTOMY    . HERNIA REPAIR Left 1957  . HERNIA REPAIR     06/21/17  . INTRAOPERATIVE TRANSESOPHAGEAL ECHOCARDIOGRAM N/A 02/14/2013   Procedure: INTRAOPERATIVE TRANSESOPHAGEAL ECHOCARDIOGRAM;  Surgeon: Ivin Poot, MD;  Location: [facility];  Service: Open Heart Surgery;  Laterality: N/A;  . LATERAL EPICONDYLE RELEASE Left 07/29/07   DR. GRAVES  . LEFT AND RIGHT HEART CATHETERIZATION WITH CORONARY ANGIOGRAM N/A 01/27/2013   Procedure: LEFT AND RIGHT HEART CATHETERIZATION WITH CORONARY ANGIOGRAM;  Surgeon: Sinclair Grooms, MD;  Location: [facility] CATH LAB;  Service: Cardiovascular;  Laterality: N/A;  . LEFT HEART CATHETERIZATION WITH CORONARY/GRAFT ANGIOGRAM N/A 07/28/2013   Procedure: LEFT HEART CATHETERIZATION WITH Beatrix Fetters;  Surgeon: Leonie Man, MD;  Location: [facility] CATH LAB;  Service: Cardiovascular;  Laterality: N/A;  . PARTIAL NEPHRECTOMY  08/24/02   RIGHT...DR. Risa Grill  . PERCUTANEOUS STENT INTERVENTION N/A 08/10/2013   Procedure: PERCUTANEOUS STENT INTERVENTION;  Surgeon: Sinclair Grooms, MD;  Location: [facility] CATH LAB;  Service: Cardiovascular;  Laterality: N/A;  . TOE SURGERY Right    "shaved; wired back straight"  . TONSILLECTOMY      Current Medications: Current Meds  Medication Sig  . ALPRAZolam (XANAX) 1 MG tablet Take 1 mg by mouth 4 (four) times daily.  Marland Kitchen amLODipine (NORVASC) 5 MG tablet Take 1 tablet (5 mg total) by mouth daily.  Marland Kitchen aspirin EC 81 MG EC tablet Take 1 tablet (81 mg total) by mouth daily.  . carisoprodol (SOMA) 350 MG tablet Take 175 mg by mouth 2 (two) times daily.   . Diclofenac-Misoprostol (ARTHROTEC) 50-0.2 MG TBEC Take 1 tablet by mouth 2 (two) times  daily.  . DULoxetine (CYMBALTA) 30 MG capsule Take 30 mg by mouth 2 (two) times daily.  . furosemide (LASIX) 40 MG tablet Take 40 mg by mouth 2 (two) times daily.  . hyoscyamine (LEVSIN SL) 0.125 MG SL tablet Take 0.25 mg by mouth every 6 (six) hours as needed (IBS).   Marland Kitchen irbesartan (AVAPRO) 150 MG tablet Take 1 tablet (150 mg total) by mouth daily.  Marland Kitchen loperamide (IMODIUM A-D) 2 MG tablet Take 4 mg by mouth 3 (three) times daily as needed for diarrhea or loose stools.  . Naphazoline HCl (CLEAR EYES OP) Place 1 drop into both  eyes 3 (three) times daily.  . nitroGLYCERIN (NITROSTAT) 0.4 MG SL tablet Place 1 tablet (0.4 mg total) under the tongue every 5 (five) minutes as needed for chest pain (MAX 3 TABLETS).  Marland Kitchen oxyCODONE (OXYCONTIN) 20 mg 12 hr tablet Take 20 mg by mouth every 12 (twelve) hours.  Marland Kitchen oxyCODONE-acetaminophen (PERCOCET) 10-325 MG tablet Take 1 tablet by mouth every 8 (eight) hours.   . sertraline (ZOLOFT) 100 MG tablet Take 100 mg by mouth daily.   Marland Kitchen testosterone cypionate (DEPOTESTOSTERONE CYPIONATE) 200 MG/ML injection Inject 200 mg once every 14 days  . zolpidem (AMBIEN) 10 MG tablet Take 10 mg by mouth at bedtime.      Allergies:   Aspirin; Nsaids; Tolmetin; Zoloft [sertraline hcl]; Klonopin [clonazepam]; Beta adrenergic blockers; Penicillins; and Reglan [metoclopramide]   Social History   Socioeconomic History  . Marital status: Married    Spouse name: Not on file  . Number of children: Not on file  . Years of education: Not on file  . Highest education level: Not on file  Occupational History  . Not on file  Social Needs  . Financial resource strain: Not on file  . Food insecurity:    Worry: Not on file    Inability: Not on file  . Transportation needs:    Medical: Not on file    Non-medical: Not on file  Tobacco Use  . Smoking status: Former Smoker    Packs/day: 1.50    Years: 6.00    Pack years: 9.00    Types: Cigarettes    Start date: 10/06/1969    Last  attempt to quit: 01/21/1976    Years since quitting: 42.0  . Smokeless tobacco: Current User    Types: Snuff  . Tobacco comment: quit  36 years ago  Substance and Sexual Activity  . Alcohol use: Yes    Alcohol/week: 0.0 standard drinks    Comment: "last beer was 01/2013"  . Drug use: No  . Sexual activity: Yes  Lifestyle  . Physical activity:    Days per week: Not on file    Minutes per session: Not on file  . Stress: Not on file  Relationships  . Social connections:    Talks on phone: Not on file    Gets together: Not on file    Attends religious service: Not on file    Active member of club or organization: Not on file    Attends meetings of clubs or organizations: Not on file    Relationship status: Not on file  Other Topics Concern  . Not on file  Social History Narrative   Was working out regularly prior to CABG/AVR.  Hunts/fishes.     Family History: The patient's family history includes Cancer in his mother; Heart disease in his father.  ROS:   Please see the history of present illness.    Other complaints include stomach pain, nausea and sweating, earwax, significant back pain that is continuously present 24/7.  This interferes with sleep.  He does not sleep well.  He feels terrible each morning when he awakens, not feeling rested.  He is depressed.  He has intermittent diarrhea.  He is increase furosemide to 40 mg twice a day because of lower extremity swelling.  States that the back is caused him to sit most of the time with his legs dangling.  The increase in furosemide tablets was under the direction of Dr. Deforest Hoyles.  He also has chronic pain in his arms  and in his neck due to bilateral cervical disc disease with nerve root compression.  He is developing muscle atrophy in his hands.  All other systems reviewed and are negative.  EKGs/Labs/Other Studies Reviewed:    The following studies were reviewed today:  2D Doppler echocardiogram November 25, 2016: Study  Conclusions  - Left ventricle: The cavity size was normal. There was moderate   concentric hypertrophy. Systolic function was normal. The   estimated ejection fraction was in the range of 55% to 60%. Wall   motion was normal; there were no regional wall motion   abnormalities. Features are consistent with a pseudonormal left   ventricular filling pattern, with concomitant abnormal relaxation   and increased filling pressure (grade 2 diastolic dysfunction).   Doppler parameters are consistent with high ventricular filling   pressure. - Aortic valve: Poorly visualized. A bioprosthesis was present and   functioning normally. Mean gradient (S): 17 mm Hg. - Mitral valve: Moderately calcified annulus. Mild diffuse   calcification of the anterior leaflet. There was mild   regurgitation. - Left atrium: The atrium was mildly dilated. 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 - Atrial septum: There was increased thickness of the septum,   consistent with lipomatous hypertrophy. - Tricuspid valve: There was trivial regurgitation.   EKG:  EKG is not performed today and was most recently done on 08/17/2017 demonstrates sinus rhythm with left anterior hemiblock and poor R wave progression.  Recent Labs: 09/01/2017: BUN 27; Creatinine, Ser 1.03; Hemoglobin 16.4; Platelets 129; Potassium 4.0; Sodium 140  Recent Lipid Panel No results found for: CHOL, TRIG, HDL, CHOLHDL, VLDL, LDLCALC, LDLDIRECT  Physical Exam:    VS:  BP 138/82   Pulse 65   Ht 5\' 6"  (1.676 m)   Wt 171 lb 9.6 oz (77.8 kg)   SpO2 98%   BMI 27.70 kg/m     Wt Readings from Last 3 Encounters:  01/18/18 171 lb 9.6 oz (77.8 kg)  09/06/17 163 lb (73.9 kg)  09/01/17 165 lb 14.4 oz (75.3 kg)     GEN: Head is somewhat drooped.  Appears sad at times and on other occasions somewhat irritated/angry.. No acute distress HEENT: Normal NECK: No JVD. LYMPHATICS: No lymphadenopathy CARDIAC: RRR.  3/6 systolic right  upper sternal border murmur due to the bioprosthetic aortic valve.  An S4 gallop is audible.  No S3 gallop or edema VASCULAR: Pulses are 2+ and symmetric in the radial and carotid locations., Bruits are heard loudly in both carotids likely due to transmission from the aortic valve. RESPIRATORY:  Clear to auscultation without rales, wheezing or rhonchi  ABDOMEN: Soft, non-tender, non-distended, No pulsatile mass, MUSCULOSKELETAL: No deformity  SKIN: Warm and dry NEUROLOGIC:  Alert and oriented x 3 PSYCHIATRIC:  Normal affect   ASSESSMENT:    1. S/P AVR (aortic valve replacement)   2. Coronary artery disease involving coronary bypass graft of native heart without angina pectoris   3. Essential hypertension   4. Mixed hyperlipidemia   5. Chronic diastolic heart failure ([place])   6. Syncope, unspecified syncope type   7. Other chronic pain   8. Depression, reactive    PLAN:    In order of problems listed above:  1. Reassess aortic valve with 2D Doppler echo to exclude the possibility of early valve failure.  If we are not able to better visualize the aortic valve on this study, he may need to have TEE performed. 2. Multiple types of  chest discomfort make unstable angina/ischemia induced chest pain less likely.  Aggressive risk factor modification including smoking cessation, lipid-lowering, blood pressure control, adequate rest, and aerobic activity are discussed in detail.  For nearly each risk factor he is unable to comply for various reasons. 3. The blood pressure is a little high.  We discussed salt restriction.  We also discussed the benefits of activity. 4. Lipid target his LDL less than 70.  In October LDL was 77.  This is doing relatively well and no changes are made. 5. Trivial bilateral lower extremity edema.  Continue the 40 mg twice daily dose of furosemide. 6. "Fainting" are likely due to vertigo likely secondary to ear issues.  We will get a bilateral Doppler study to exclude  posterior circulation disease.  He needs to speak with primary care and possibly needs to be referred to ENT or neurology for further evaluation of vertigo. 7. Back pain and other chronic pain related to his neck is a source of significant sleep deprivation and depression 8. Much of what is going on with him it is related to musculoskeletal pain syndrome.  He understands that he is depressed.  Overall education and awareness concerning primary/secondary risk prevention was discussed in detail: LDL less than 70, hemoglobin A1c less than 7, blood pressure target less than 130/80 mmHg, >150 minutes of moderate aerobic activity per week, avoidance of smoking, weight control (via diet and exercise), and continued surveillance/management of/for obstructive sleep apnea.  Needs referral to primary care/ENT/neurology.  Extensive time consuming office visit covering the multiple issues noted above.  Greater than 50% of the time during this office visit was spent in education, counseling, and coordination of care related to underlying disease process and testing as outlined.    Medication Adjustments/Labs and Tests Ordered: Current medicines are reviewed at length with the patient today.  Concerns regarding medicines are outlined above.  Orders Placed This Encounter  Procedures  . ECHOCARDIOGRAM COMPLETE   No orders of the defined types were placed in this encounter.   Patient Instructions  Medication Instructions:  Your physician recommends that you continue on your current medications as directed. Please refer to the Current Medication list given to you today.  If you need a refill on your cardiac medications before your next appointment, please call your pharmacy.   Lab work: None If you have labs (blood work) drawn today and your tests are completely normal, you will receive your results only by: Marland Kitchen MyChart Message (if you have MyChart) OR . A paper copy in the mail If you have any lab test  that is abnormal or we need to change your treatment, we will call you to review the results.  Testing/Procedures: Your physician has requested that you have a carotid duplex. This test is an ultrasound of the carotid arteries in your neck. It looks at blood flow through these arteries that supply the brain with blood. Allow one hour for this exam. There are no restrictions or special instructions.  Your physician has requested that you have an echocardiogram. Echocardiography is a painless test that uses sound waves to create images of your heart. It provides your doctor with information about the size and shape of your heart and how well your heart's chambers and valves are working. This procedure takes approximately one hour. There are no restrictions for this procedure.    Follow-Up: At [facility], you and your health needs are our priority.  As part of our continuing mission to provide  you with exceptional heart care, we have created designated Provider Care Teams.  These Care Teams include your primary Cardiologist (physician) and Advanced Practice Providers (APPs -  Physician Assistants and Nurse Practitioners) who all work together to provide you with the care you need, when you need it. You will need a follow up appointment in 9-12 months.  Please call our office 2 months in advance to schedule this appointment.  You may see Sinclair Grooms, MD or one of the following Advanced Practice Providers on your designated Care Team:   Truitt Merle, NP Cecilie Kicks, NP . Kathyrn Drown, NP  Any Other Special Instructions Will Be Listed Below (If Applicable).  Please contact your Primary Care Physician in regards to your dizziness.      Signed, Sinclair Grooms, MD  01/18/2018 11:50 AM    [place]

## 2018-01-18 ENCOUNTER — Ambulatory Visit (INDEPENDENT_AMBULATORY_CARE_PROVIDER_SITE_OTHER): Payer: Federal, State, Local not specified - PPO | Admitting: Interventional Cardiology

## 2018-01-18 ENCOUNTER — Encounter: Payer: Self-pay | Admitting: Interventional Cardiology

## 2018-01-18 VITALS — BP 138/82 | HR 65 | Ht 66.0 in | Wt 171.6 lb

## 2018-01-18 DIAGNOSIS — I5032 Chronic diastolic (congestive) heart failure: Secondary | ICD-10-CM

## 2018-01-18 DIAGNOSIS — R42 Dizziness and giddiness: Secondary | ICD-10-CM

## 2018-01-18 DIAGNOSIS — I10 Essential (primary) hypertension: Secondary | ICD-10-CM | POA: Diagnosis not present

## 2018-01-18 DIAGNOSIS — E782 Mixed hyperlipidemia: Secondary | ICD-10-CM

## 2018-01-18 DIAGNOSIS — G8929 Other chronic pain: Secondary | ICD-10-CM

## 2018-01-18 DIAGNOSIS — I25810 Atherosclerosis of coronary artery bypass graft(s) without angina pectoris: Secondary | ICD-10-CM

## 2018-01-18 DIAGNOSIS — F329 Major depressive disorder, single episode, unspecified: Secondary | ICD-10-CM

## 2018-01-18 DIAGNOSIS — Z952 Presence of prosthetic heart valve: Secondary | ICD-10-CM

## 2018-01-18 NOTE — Patient Instructions (Signed)
Medication Instructions:  Your physician recommends that you continue on your current medications as directed. Please refer to the Current Medication list given to you today.  If you need a refill on your cardiac medications before your next appointment, please call your pharmacy.   Lab work: None If you have labs (blood work) drawn today and your tests are completely normal, you will receive your results only by: Marland Kitchen MyChart Message (if you have MyChart) OR . A paper copy in the mail If you have any lab test that is abnormal or we need to change your treatment, we will call you to review the results.  Testing/Procedures: Your physician has requested that you have a carotid duplex. This test is an ultrasound of the carotid arteries in your neck. It looks at blood flow through these arteries that supply the brain with blood. Allow one hour for this exam. There are no restrictions or special instructions.  Your physician has requested that you have an echocardiogram. Echocardiography is a painless test that uses sound waves to create images of your heart. It provides your doctor with information about the size and shape of your heart and how well your heart's chambers and valves are working. This procedure takes approximately one hour. There are no restrictions for this procedure.    Follow-Up: At [facility], you and your health needs are our priority.  As part of our continuing mission to provide you with exceptional heart care, we have created designated Provider Care Teams.  These Care Teams include your primary Cardiologist (physician) and Advanced Practice Providers (APPs -  Physician Assistants and Nurse Practitioners) who all work together to provide you with the care you need, when you need it. You will need a follow up appointment in 9-12 months.  Please call our office 2 months in advance to schedule this appointment.  You may see Sinclair Grooms, MD or one of the following Advanced  Practice Providers on your designated Care Team:   Truitt Merle, NP Cecilie Kicks, NP . Kathyrn Drown, NP  Any Other Special Instructions Will Be Listed Below (If Applicable).  Please contact your Primary Care Physician in regards to your dizziness.

## 2018-01-24 ENCOUNTER — Other Ambulatory Visit: Payer: Self-pay | Admitting: Interventional Cardiology

## 2018-01-24 DIAGNOSIS — R55 Syncope and collapse: Secondary | ICD-10-CM

## 2018-01-24 DIAGNOSIS — R42 Dizziness and giddiness: Secondary | ICD-10-CM

## 2018-01-26 ENCOUNTER — Other Ambulatory Visit: Payer: Self-pay | Admitting: Interventional Cardiology

## 2018-01-27 ENCOUNTER — Ambulatory Visit (HOSPITAL_COMMUNITY)
Admission: RE | Admit: 2018-01-27 | Discharge: 2018-01-27 | Disposition: A | Discharge status: Home / Self Care | Payer: Federal, State, Local not specified - PPO | Source: Ambulatory Visit | Attending: Cardiology | Admitting: Cardiology

## 2018-01-27 DIAGNOSIS — R55 Syncope and collapse: Secondary | ICD-10-CM | POA: Insufficient documentation

## 2018-01-27 DIAGNOSIS — R42 Dizziness and giddiness: Secondary | ICD-10-CM | POA: Insufficient documentation

## 2018-02-02 ENCOUNTER — Other Ambulatory Visit: Payer: Self-pay | Admitting: Interventional Cardiology

## 2018-02-03 ENCOUNTER — Other Ambulatory Visit: Payer: Self-pay

## 2018-02-03 ENCOUNTER — Ambulatory Visit (HOSPITAL_COMMUNITY): Payer: Federal, State, Local not specified - PPO | Attending: Cardiology

## 2018-02-03 DIAGNOSIS — I5032 Chronic diastolic (congestive) heart failure: Secondary | ICD-10-CM | POA: Insufficient documentation

## 2018-02-03 DIAGNOSIS — I25810 Atherosclerosis of coronary artery bypass graft(s) without angina pectoris: Secondary | ICD-10-CM | POA: Insufficient documentation

## 2018-02-03 DIAGNOSIS — Z952 Presence of prosthetic heart valve: Secondary | ICD-10-CM

## 2018-02-07 ENCOUNTER — Other Ambulatory Visit: Payer: Self-pay | Admitting: Interventional Cardiology

## 2018-02-23 ENCOUNTER — Ambulatory Visit
Admission: RE | Admit: 2018-02-23 | Discharge: 2018-02-23 | Disposition: A | Discharge status: Home / Self Care | Payer: Federal, State, Local not specified - PPO | Source: Ambulatory Visit | Attending: Internal Medicine | Admitting: Internal Medicine

## 2018-02-23 ENCOUNTER — Other Ambulatory Visit: Payer: Self-pay | Admitting: Internal Medicine

## 2018-02-23 DIAGNOSIS — C641 Malignant neoplasm of right kidney, except renal pelvis: Secondary | ICD-10-CM

## 2018-05-03 ENCOUNTER — Other Ambulatory Visit: Payer: Self-pay | Admitting: *Deleted

## 2018-05-03 DIAGNOSIS — Z952 Presence of prosthetic heart valve: Secondary | ICD-10-CM

## 2018-07-13 ENCOUNTER — Telehealth: Payer: Self-pay | Admitting: Interventional Cardiology

## 2018-07-13 NOTE — Telephone Encounter (Signed)
Pt c/o Shortness Of Breath: STAT if SOB developed within the last 24 hours or pt is noticeably SOB on the phone  1. Are you currently SOB (can you hear that pt is SOB on the phone)? no  2. How long have you been experiencing SOB? Very long time, states he told Dr. Tamala Julian last time he was here.   3. Are you SOB when sitting or when up moving around? oving  4. Are you currently experiencing any other symptoms? Very tired, gets SOB even just walking to end of driveway, has to sit to rest. Has sharp pain sometimes in chest, or dull pain.  He wants to know if he is end stage heart disease if someone can just tell him there is nothing more they can do for him, or if there is something that can be done why isn't there something being done.

## 2018-07-13 NOTE — Telephone Encounter (Signed)
Spoke with pt and he states that SOB has worsened since he seen Dr. Tamala Julian in December.  Echo was ordered then to be repeated in 6 mos but was cancelled due to virus and not yet rescheduled.  Message sent to echo scheduler.  Pt states he has constant swelling in his feet and neuropathy.  Gets SOB just taking trash to the road.  Has angina off and on several times a week.  Sometimes the pain is a sharp, shooting pain, other times a dull ache and then other times just more of a pressure.  Takes Nitro about every 3 days.  Has relief after 2 doses.  Unable to do much physical activity without getting extremely winded.  States he has the same sx he had prior to AVR.  Offered appt with PA tomorrow but pt declined stating he had to help his wife get the house straightened out because they just had it painted.  Scheduled him to see Cecilie Kicks, NP on Monday, 6/1.  Advised I will send message to Dr. Tamala Julian for review and would call if further advisement, otherwise just plan to keep appt on Monday.

## 2018-07-14 NOTE — Telephone Encounter (Signed)
Patient called today to cancel his appt for Monday 6/1, he states he doesn't have the strength to come in for any appt or to got through a bunch of test at this time.

## 2018-07-15 NOTE — Telephone Encounter (Signed)
Left message to call back  

## 2018-07-15 NOTE — Telephone Encounter (Signed)
Go to ER if prolonged CP. Otherwise we need to see to make further recommendations.

## 2018-07-18 ENCOUNTER — Ambulatory Visit: Payer: Federal, State, Local not specified - PPO | Admitting: Cardiology

## 2018-07-18 NOTE — Telephone Encounter (Signed)
° ° °  Please return call to patient °

## 2018-07-18 NOTE — Telephone Encounter (Signed)
Spoke with pt and went over recommendations per Dr. Tamala Julian.  Pt states that he doesn't feel like he needs to go to ER regardless because he has been dealing with this pain since his bypass surgery.  Had long conversation about change in stamina and importance of getting checked out.  Pt finally agreeable to come into office next week to see Cecilie Kicks, NP.  Offered appt for this Thursday but he would rather wait until next week. Scheduled pt for 6/9 in office.

## 2018-07-25 NOTE — Progress Notes (Signed)
Cardiology Office Note   Date:  07/26/2018   ID:  Max Byrd, Max Byrd 06-08-1955, MRN [id number]  PCP:  Wenda Low, MD  Cardiologist:  Dr. Tamala Julian     Chief Complaint  Patient presents with   Shortness of Breath      History of Present Illness: Max Byrd is a 63 y.o. male who presents for increased SOB, edema increased angina.  2 NTG with relief is presenting for eval.   He has a hx of chronic kidney disease, bicuspid aortic valve treated with pericardial tissue valve2014, hypertension, CAD with angina, circumflex stent after failed saphenous vein graftto OM 2014, CABG 02/14/13 LIMA to LAD, VG to LCX, and VC to diag. Also with depression. Recent spinal fusion.  Prior visits the patient has multiple complaints.  He reiterates that since his surgery nearly 5 years ago he has never felt better.  States he has the same symptoms now as he had prior to surgery.  There are litany of complaints today.  2 years ago he was doing quite well, exercising regularly, and has significant improvement.  I reminded him of this and he cannot remember.  He is concerned about near fainting and fainting.  He states that on 3 occasions while playing his guitar that he suddenly develops a spinning dizziness that causes him to go to his knees.  There is no shortness of breath, chest pain, or palpitations associated.  He has to get down on his knees and wait for the sensation of spinning to go away.  This sounds different than what he had prior to surgery which were frank episodes of syncope that were felt to be related to aortic stenosis.  He denies buzzing and ringing in his ears.  He is having at least 3 different qualities of chest discomfort from time to time.  One is right upper chest discomfort that can last for hours that he describes as a throb.  He rarely has pressure in his chest for which she will take nitroglycerin.  The third type of discomfort is a sharp pain that shoots through his chest and  lasts seconds.  He can be intermittent off and on throughout the day.  He is not sleeping well because of back pain.  He is also having intermittent pain in his stomach area  He is concerned that he will die soon and feels that all of his current problems and complaints some of which are included in the review of systems are due to his heart valve going bad.  Echo was ordered.   The valve - aortic valve had elevated mean gradient 28 mmHg an dno AI, to be followed every 6 months, and dilated ascending aorta 4.5 cm  And mild PH with PA pk pressure of 36 mmHg.    Carotid dopplers 01/27/18  Minimal disease.1-39% bilateral stenosis  At last visit he was asked to sop smoking and exercise.  LDL was stable.    Today with increased SOB and lower ext edema and chest pain.  His echo has been rescheduled.   But echo is next week.  Pt talked for some time about several items, pain meds and marijuana. His pain med has been decreased due to new laws against opiates.  So his pain is not controlled.  He is up at 4a and works out and plays guitar but by 1 pm he is worn out. He is not happy with his lack of ability to do what he used to  do. He has frustration with numerous areas.    He is having more chest pain and 2 NTG will help. If he takes none then it eventually goes away.  He has some swelling as well and SOB.  I offered him a cath to see if he has increase in his CAD- he declined.     Past Medical History:  Diagnosis Date   Anginal pain ([place]) 2015   "since valve was done"   Anxiety    Arthritis    "left pointer finger; forminal openings bilaterally" (08/10/2013)   Bicuspid aortic valve    a. 01/2013 s/p AVR - 41mm Edwards 3300 TFX pericardial tissue valve, ser # [id number].   CKD (chronic kidney disease) stage 2, GFR 60-89 ml/min    Coronary atherosclerosis of native coronary artery    a. 01/2013 CABGx3: LIMA->LAD, VG->Diag, VG->OM (performed @ time of AVR),  occluded SVG-OM2 , now with 95%  stenosis at the anastomosis site on OM 2 - PCI attempt 07/28/13 unsuccessful - med Rx cont'd    Depression    Erectile dysfunction    INJECTIONS OF PROSTAGLANDIN BY UROLGIST   Essential hypertension, benign    GERD (gastroesophageal reflux disease)    H/O Bell's palsy 2010 X2   Heart murmur    History of blood transfusion    "related to stomach bleeding from ASA & NSAIDS   History of stomach ulcers    Hyperlipidemia    IBS (irritable bowel syndrome)    Migraine    "went away in the 1990's"   Pain, chronic postoperative    S/P LEFT ARM SURGERY   Renal cell carcinoma of right kidney ([place]) 2003    Past Surgical History:  Procedure Laterality Date   ANAL SPHINCTEROTOMY  04/13/03   DR. GRAPEY   AORTIC VALVE REPLACEMENT N/A 02/14/2013   Procedure: AORTIC VALVE REPLACEMENT (AVR);  Surgeon: Ivin Poot, MD;  Location: [facility];  Service: Open Heart Surgery;  Laterality: N/A;   BUNIONECTOMY     right  02/2009   CARDIAC CATHETERIZATION  2010; 6/12015; 07/29/2013   CARDIAC VALVE REPLACEMENT     COLONOSCOPY  2008   DIVERTICULOSIS   CORONARY ANGIOPLASTY WITH STENT PLACEMENT  08/10/2013   "1"   CORONARY ARTERY BYPASS GRAFT N/A 02/14/2013   Procedure: CORONARY ARTERY BYPASS GRAFTING (CABG);  Surgeon: Ivin Poot, MD;  Location: [place];  Service: Open Heart Surgery;  Laterality: N/A;   ELBOW SURGERY Left X 2   ELBOW SURGERY Right X 2   ELBOW SURGERY     left  2009   EXCISIONAL HEMORRHOIDECTOMY     HERNIA REPAIR Left 1957   HERNIA REPAIR     06/21/17   INTRAOPERATIVE TRANSESOPHAGEAL ECHOCARDIOGRAM N/A 02/14/2013   Procedure: INTRAOPERATIVE TRANSESOPHAGEAL ECHOCARDIOGRAM;  Surgeon: Ivin Poot, MD;  Location: [facility];  Service: Open Heart Surgery;  Laterality: N/A;   LATERAL EPICONDYLE RELEASE Left 07/29/07   DR. GRAVES   LEFT AND RIGHT HEART CATHETERIZATION WITH CORONARY ANGIOGRAM N/A 01/27/2013   Procedure: LEFT AND RIGHT HEART CATHETERIZATION WITH  CORONARY ANGIOGRAM;  Surgeon: Sinclair Grooms, MD;  Location[facility] LAB;  Service: Cardiovascular;  Laterality: N/A;   LEFT HEART CATHETERIZATION WITH CORONARY/GRAFT ANGIOGRAM N/A 07/28/2013   Procedure: LEFT HEART CATHETERIZATION WITH Beatrix Fetters;  Surgeon: Leonie Man, MD;  Location: [facility] CATH LAB;  Service: Cardiovascular;  Laterality: N/A;   PARTIAL NEPHRECTOMY  08/24/02   RIGHT...DR. Risa Grill   PERCUTANEOUS STENT INTERVENTION N/A  08/10/2013   Procedure: PERCUTANEOUS STENT INTERVENTION;  Surgeon: Sinclair Grooms, MD;  Location: [facility] CATH LAB;  Service: Cardiovascular;  Laterality: N/A;   TOE SURGERY Right    "shaved; wired back straight"   TONSILLECTOMY       Current Outpatient Medications  Medication Sig Dispense Refill   ALPRAZolam (XANAX) 1 MG tablet Take 1 mg by mouth 4 (four) times daily.     amLODipine (NORVASC) 5 MG tablet Take 1 tablet (5 mg total) by mouth daily. 90 tablet 3   carisoprodol (SOMA) 350 MG tablet Take 175 mg by mouth 2 (two) times daily.      Diclofenac-Misoprostol (ARTHROTEC) 50-0.2 MG TBEC Take 1 tablet by mouth 2 (two) times daily.     DULoxetine (CYMBALTA) 30 MG capsule Take 30 mg by mouth 2 (two) times daily.     furosemide (LASIX) 40 MG tablet Take 40 mg by mouth 2 (two) times daily.     hyoscyamine (LEVSIN SL) 0.125 MG SL tablet Take 0.25 mg by mouth every 6 (six) hours as needed (IBS).      irbesartan (AVAPRO) 150 MG tablet Take 1 tablet (150 mg total) by mouth daily. 30 tablet 11   loperamide (IMODIUM A-D) 2 MG tablet Take 4 mg by mouth 3 (three) times daily as needed for diarrhea or loose stools.     Naphazoline HCl (CLEAR EYES OP) Place 1 drop into both eyes 3 (three) times daily.     nitroGLYCERIN (NITROSTAT) 0.4 MG SL tablet Place 1 tablet (0.4 mg total) under the tongue every 5 (five) minutes as needed for chest pain (MAX 3 TABLETS). 25 tablet 6   oxyCODONE (OXYCONTIN) 20 mg 12 hr tablet Take 20 mg by mouth every 12  (twelve) hours.     oxyCODONE-acetaminophen (PERCOCET) 10-325 MG tablet Take 1 tablet by mouth every 8 (eight) hours.      sertraline (ZOLOFT) 100 MG tablet Take 100 mg by mouth daily.      testosterone cypionate (DEPOTESTOSTERONE CYPIONATE) 200 MG/ML injection Inject 200 mg once every 14 days  3   zolpidem (AMBIEN) 10 MG tablet Take 10 mg by mouth at bedtime.   5   No current facility-administered medications for this visit.     Allergies:   Aspirin; Nsaids; Tolmetin; Zoloft [sertraline hcl]; Klonopin [clonazepam]; Beta adrenergic blockers; Penicillins; and Reglan [metoclopramide]    Social History:  The patient  reports that he quit smoking about 42 years ago. His smoking use included cigarettes. He started smoking about 48 years ago. He has a 9.00 pack-year smoking history. His smokeless tobacco use includes snuff. He reports current alcohol use. He reports that he does not use drugs.   Family History:  The patient's family history includes Cancer in his mother; Heart disease in his father.    ROS:  General:no colds or fevers, no weight changes Skin:no rashes or ulcers HEENT:no blurred vision, no congestion CV:see HPI PUL:see HPI GI:no diarrhea constipation or melena, no indigestion GU:no hematuria, no dysuria MS:no joint pain, no claudication Neuro:no syncope, no lightheadedness Endo:no diabetes, no thyroid disease  Wt Readings from Last 3 Encounters:  07/26/18 174 lb (78.9 kg)  01/18/18 171 lb 9.6 oz (77.8 kg)  09/06/17 163 lb (73.9 kg)     PHYSICAL EXAM: VS:  BP (!) 130/94    Pulse 65    Ht 5\' 6"  (1.676 m)    Wt 174 lb (78.9 kg)    SpO2 99%    BMI 28.08  kg/m  , BMI Body mass index is 28.08 kg/m. General:Pleasant affect, NAD Skin:Warm and dry, brisk capillary refill HEENT:normocephalic, sclera clear, mucus membranes moist Neck:supple, no JVD, no bruits  Heart:S1S2 RRR with harsh 3/6 aortic murmur, no gallup, rub or click Lungs:clear without rales, rhonchi, or  wheezes FAO:ZHYQ, non tender, + BS, do not palpate liver spleen or masses Ext:tr to 1 + lower ext edema, 2+ pedal pulses, 2+ radial pulses, weh hands raised above head hands become pale and very cool. Neuro:alert and oriented X 3, MAE, follows commands, + facial symmetry    EKG:  EKG is ordered today. The ekg ordered today demonstrates SR 1st degree AV block LBBB. No acute changes   Recent Labs: 09/01/2017: BUN 27; Creatinine, Ser 1.03; Hemoglobin 16.4; Platelets 129; Potassium 4.0; Sodium 140    Lipid Panel No results found for: CHOL, TRIG, HDL, CHOLHDL, VLDL, LDLCALC, LDLDIRECT     Other studies Reviewed: Additional studies/ records that were reviewed today include:  Carotid dopplers 01/27/18 . Right Carotid: Velocities in the right ICA are consistent with a 1-39% stenosis.  Left Carotid: Velocities in the left ICA are consistent with a 1-39% stenosis.  Vertebrals:  Bilateral vertebral arteries demonstrate antegrade flow. Subclavians: Normal flow hemodynamics were seen in bilateral subclavian              arteries.  Echo 02/03/18 Study Conclusions  - Left ventricle: The cavity size was normal. Wall thickness was   normal. Systolic function was normal. The estimated ejection   fraction was in the range of 50% to 55%. Wall motion was normal;   there were no regional wall motion abnormalities. Features are   consistent with a pseudonormal left ventricular filling pattern,   with concomitant abnormal relaxation and increased filling   pressure (grade 2 diastolic dysfunction). Doppler parameters are   consistent with high ventricular filling pressure. - Aortic valve: A bioprosthesis was present. - Ascending aorta: The ascending aorta was mildly dilated. - Mitral valve: Calcified annulus. Mildly thickened leaflets . - Right ventricle: The cavity size was mildly dilated. - Pulmonary arteries: Systolic pressure was mildly increased. PA   peak pressure: 36 mm Hg  (S).  Impressions:  - Normal LV systolic function; moderate diastolic dysfunction; s/p   AVR with elevated mean gradient (28 mmHg) and no AI; mildly   dilated ascending aorta (4.5 cm); mild RVE; mild TR with mild   pulmonary hypertension.   ASSESSMENT AND PLAN:  1.  Chest pain more freq, he did not wish to have cath now.  He wants someone to stop the pain, I am not sure if this is due to his back pain or his CAD.  I told him I could not prescribe pain med for him.  We will wait to see what the echo reveals.  Then he may need cath.  2.  SOB - instructed to take an extra lasix if needed on rare occ. He did not wish to do this because he has to void so much.  3. Cold finger tips.  Has picture they are white, possible arterial occl.  Offered upper ext dopplers, with his AS murmur cannot hear bruits if present.   We will wait to see echo results.  4.  S/p AVR with murmur and increased SOB echo next week.  5.  Chronic diastolic HF - see # 2.   6.  Depression frustration doe see therapist. And on medication.  More agitated today.   7.  HLD continue  statin.   8.  Chronic pain.and decrease of his pain meds.    Needs follow up after echo with Dr. Tamala Julian    Current medicines are reviewed with the patient today.  The patient Has no concerns regarding medicines.  The following changes have been made:  See above Labs/ tests ordered today include:see above  Disposition:   FU:  see above  Signed, Cecilie Kicks, NP  07/26/2018 8:56 AM    [address], [place], [place], [place] Phone: [phone]; Fax: [phone]

## 2018-07-26 ENCOUNTER — Other Ambulatory Visit: Payer: Self-pay

## 2018-07-26 ENCOUNTER — Encounter: Payer: Self-pay | Admitting: Cardiology

## 2018-07-26 ENCOUNTER — Ambulatory Visit: Payer: Federal, State, Local not specified - PPO | Admitting: Cardiology

## 2018-07-26 VITALS — BP 130/94 | HR 65 | Ht 66.0 in | Wt 174.0 lb

## 2018-07-26 DIAGNOSIS — I5032 Chronic diastolic (congestive) heart failure: Secondary | ICD-10-CM | POA: Diagnosis not present

## 2018-07-26 DIAGNOSIS — Z952 Presence of prosthetic heart valve: Secondary | ICD-10-CM | POA: Diagnosis not present

## 2018-07-26 DIAGNOSIS — R0602 Shortness of breath: Secondary | ICD-10-CM

## 2018-07-26 DIAGNOSIS — I2510 Atherosclerotic heart disease of native coronary artery without angina pectoris: Secondary | ICD-10-CM | POA: Diagnosis not present

## 2018-07-26 DIAGNOSIS — E782 Mixed hyperlipidemia: Secondary | ICD-10-CM | POA: Diagnosis not present

## 2018-07-26 DIAGNOSIS — G8929 Other chronic pain: Secondary | ICD-10-CM

## 2018-07-26 DIAGNOSIS — F329 Major depressive disorder, single episode, unspecified: Secondary | ICD-10-CM

## 2018-07-26 DIAGNOSIS — I10 Essential (primary) hypertension: Secondary | ICD-10-CM

## 2018-07-26 NOTE — Patient Instructions (Signed)
Medication Instructions:  Your physician recommends that you continue on your current medications as directed. Please refer to the Current Medication list given to you today.  If you need a refill on your cardiac medications before your next appointment, please call your pharmacy.   Lab work: NONE If you have labs (blood work) drawn today and your tests are completely normal, you will receive your results only by: Marland Kitchen MyChart Message (if you have MyChart) OR . A paper copy in the mail If you have any lab test that is abnormal or we need to change your treatment, we will call you to review the results.  Testing/Procedures: NONE  Follow-Up: At [facility], you and your health needs are our priority.  As part of our continuing mission to provide you with exceptional heart care, we have created designated Provider Care Teams.  These Care Teams include your primary Cardiologist (physician) and Advanced Practice Providers (APPs -  Physician Assistants and Nurse Practitioners) who all work together to provide you with the care you need, when you need it. You will need a follow up appointment in 4 months.  Please call our office 2 months in advance to schedule this appointment.  You may see Sinclair Grooms, MD or one of the following Advanced Practice Providers on your designated Care Team:   Truitt Merle, NP Cecilie Kicks, NP . Kathyrn Drown, NP  Any Other Special Instructions Will Be Listed Below (If Applicable).

## 2018-08-02 ENCOUNTER — Telehealth (HOSPITAL_COMMUNITY): Payer: Self-pay | Admitting: Cardiology

## 2018-08-02 NOTE — Telephone Encounter (Signed)

## 2018-08-03 ENCOUNTER — Ambulatory Visit (HOSPITAL_COMMUNITY): Payer: Federal, State, Local not specified - PPO | Attending: Cardiology

## 2018-08-03 ENCOUNTER — Other Ambulatory Visit: Payer: Self-pay

## 2018-08-03 DIAGNOSIS — Z952 Presence of prosthetic heart valve: Secondary | ICD-10-CM | POA: Diagnosis not present

## 2018-09-27 ENCOUNTER — Ambulatory Visit (INDEPENDENT_AMBULATORY_CARE_PROVIDER_SITE_OTHER): Payer: Federal, State, Local not specified - PPO | Admitting: Adult Health

## 2018-09-27 ENCOUNTER — Other Ambulatory Visit: Payer: Self-pay

## 2018-09-27 ENCOUNTER — Encounter: Payer: Self-pay | Admitting: Adult Health

## 2018-09-27 VITALS — Wt 173.0 lb

## 2018-09-27 DIAGNOSIS — F322 Major depressive disorder, single episode, severe without psychotic features: Secondary | ICD-10-CM

## 2018-09-27 DIAGNOSIS — F331 Major depressive disorder, recurrent, moderate: Secondary | ICD-10-CM

## 2018-09-27 DIAGNOSIS — G4700 Insomnia, unspecified: Secondary | ICD-10-CM

## 2018-09-27 DIAGNOSIS — F411 Generalized anxiety disorder: Secondary | ICD-10-CM | POA: Insufficient documentation

## 2018-09-27 MED ORDER — DULOXETINE HCL 30 MG PO CPEP: 30.0000 mg | ORAL_CAPSULE | Freq: Two times a day (BID) | ORAL | 5 refills | Status: DC

## 2018-09-27 MED ORDER — ALPRAZOLAM 1 MG PO TABS: 1.0000 mg | ORAL_TABLET | Freq: Four times a day (QID) | ORAL | 2 refills | Status: DC

## 2018-09-27 MED ORDER — SERTRALINE HCL 100 MG PO TABS: 100.0000 mg | ORAL_TABLET | Freq: Every day | ORAL | 5 refills | Status: DC

## 2018-09-27 MED ORDER — ZOLPIDEM TARTRATE 10 MG PO TABS: 10.0000 mg | ORAL_TABLET | Freq: Every day | ORAL | 2 refills | Status: DC

## 2018-09-27 NOTE — Progress Notes (Signed)
NAVY BELAY [id number] 1955-11-30 63 y.o.  Subjective:   Patient ID:  Max Byrd is a 63 y.o. (DOB 03-03-1955) male.  Chief Complaint:  Chief Complaint  Patient presents with  . Anxiety  . Depression  . Insomnia    HPI Max Byrd presents to the office today for follow-up of depression, anxiety, and insomnia.  Describes mood today as "so-so". Reports depression, anxiety, and irritability. Stating "I have a real problem with anxiety". Has one and 1/2 hours a day where he is able to do anything. Tries to wash car, play guitar, things around the house. Originally had 5 hours a day after open heart surgery in 2015. Feels like he has lost and hour of activity every year. Working with pain clinic, but feels like his pain is unmanaged. Stating "the DEA has cut my medications again. Concerned about his ongoing health issues, implications of [id number]-74, and his inability to take care of his family. Energy levels decreased. Active, exercises 2 days a week for 45 minutes. Disabled. Enjoys some usual interests. Spending time with family - wife. Appetite adequate. Weight stable. Sleeps better some nights than others - "depends on my pain level".  Focus and concentration "not that great anymore". Having issues remembering things - "finding words".  Denies SI or HI. Denies AH or VH.    Review of Systems:  Review of Systems  Musculoskeletal: Negative for gait problem.  Neurological: Negative for tremors.  Psychiatric/Behavioral:       Please refer to HPI    Medications: I have reviewed the patient's current medications.  Current Outpatient Medications  Medication Sig Dispense Refill  . ALPRAZolam (XANAX) 1 MG tablet Take 1 tablet (1 mg total) by mouth 4 (four) times daily. 30 tablet 2  . amLODipine (NORVASC) 5 MG tablet Take 1 tablet (5 mg total) by mouth daily. 90 tablet 3  . carisoprodol (SOMA) 350 MG tablet Take 175 mg by mouth 2 (two) times daily.     . Diclofenac-Misoprostol (ARTHROTEC)  50-0.2 MG TBEC Take 1 tablet by mouth 2 (two) times daily.    . DULoxetine (CYMBALTA) 30 MG capsule Take 1 capsule (30 mg total) by mouth 2 (two) times daily. 60 capsule 5  . furosemide (LASIX) 40 MG tablet Take 40 mg by mouth 2 (two) times daily.    . hyoscyamine (LEVSIN SL) 0.125 MG SL tablet Take 0.25 mg by mouth every 6 (six) hours as needed (IBS).     Marland Kitchen irbesartan (AVAPRO) 150 MG tablet Take 1 tablet (150 mg total) by mouth daily. 30 tablet 11  . loperamide (IMODIUM A-D) 2 MG tablet Take 4 mg by mouth 3 (three) times daily as needed for diarrhea or loose stools.    . Naphazoline HCl (CLEAR EYES OP) Place 1 drop into both eyes 3 (three) times daily.    . nitroGLYCERIN (NITROSTAT) 0.4 MG SL tablet Place 1 tablet (0.4 mg total) under the tongue every 5 (five) minutes as needed for chest pain (MAX 3 TABLETS). 25 tablet 6  . oxyCODONE (OXYCONTIN) 20 mg 12 hr tablet Take 20 mg by mouth every 12 (twelve) hours.    Marland Kitchen oxyCODONE-acetaminophen (PERCOCET) 10-325 MG tablet Take 1 tablet by mouth every 8 (eight) hours.     . sertraline (ZOLOFT) 100 MG tablet Take 1 tablet (100 mg total) by mouth daily. 30 tablet 5  . testosterone cypionate (DEPOTESTOSTERONE CYPIONATE) 200 MG/ML injection Inject 200 mg once every 14 days  3  . zolpidem (AMBIEN)  10 MG tablet Take 1 tablet (10 mg total) by mouth at bedtime. 30 tablet 2   No current facility-administered medications for this visit.     Medication Side Effects: None  Allergies:  Allergies  Allergen Reactions  . Aspirin Other (See Comments)    H/O BLEEDING ULCER  . Nsaids Other (See Comments)    Stomach bleeding  . Tolmetin Other (See Comments)    Stomach bleeding  . Zoloft [Sertraline Hcl] Other (See Comments)    FELT TERRIBLE  . Klonopin [Clonazepam] Other (See Comments)    Erectile dysfunction  . Beta Adrenergic Blockers Nausea Only    Metoprolol  . Penicillins Nausea And Vomiting and Other (See Comments)    Has patient had a PCN reaction  causing immediate rash, facial/tongue/throat swelling, SOB or lightheadedness with hypotension: No Has patient had a PCN reaction causing severe rash involving mucus membranes or skin necrosis: No Has patient had a PCN reaction that required hospitalization: No Has patient had a PCN reaction occurring within the last 10 years: No If all of the above answers are "NO", then may proceed with Cephalosporin use.   . Reglan [Metoclopramide] Anxiety    EXCITABILITY     Past Medical History:  Diagnosis Date  . Anginal pain ([place]) 2015   "since valve was done"  . Anxiety   . Arthritis    "left pointer finger; forminal openings bilaterally" (08/10/2013)  . Bicuspid aortic valve    a. 01/2013 s/p AVR - 67mm Edwards 3300 TFX pericardial tissue valve, ser # [id number].  Marland Kitchen CKD (chronic kidney disease) stage 2, GFR 60-89 ml/min   . Coronary atherosclerosis of native coronary artery    a. 01/2013 CABGx3: LIMA->LAD, VG->Diag, VG->OM (performed @ time of AVR),  occluded SVG-OM2 , now with 95% stenosis at the anastomosis site on OM 2 - PCI attempt 07/28/13 unsuccessful - med Rx cont'd   . Depression   . Erectile dysfunction    INJECTIONS OF PROSTAGLANDIN BY UROLGIST  . Essential hypertension, benign   . GERD (gastroesophageal reflux disease)   . H/O Bell's palsy 2010 X2  . Heart murmur   . History of blood transfusion    "related to stomach bleeding from ASA & NSAIDS  . History of stomach ulcers   . Hyperlipidemia   . IBS (irritable bowel syndrome)   . Migraine    "went away in the 1990's"  . Pain, chronic postoperative    S/P LEFT ARM SURGERY  . Renal cell carcinoma of right kidney ([place]) 2003    Family History  Problem Relation Age of Onset  . Cancer Mother        BREAST/OVARIAN  . Heart disease Father        S/P MI/CABG    Social History   Socioeconomic History  . Marital status: Married    Spouse name: Not on file  . Number of children: Not on file  . Years of education: Not on file   . Highest education level: Not on file  Occupational History  . Not on file  Social Needs  . Financial resource strain: Not on file  . Food insecurity    Worry: Not on file    Inability: Not on file  . Transportation needs    Medical: Not on file    Non-medical: Not on file  Tobacco Use  . Smoking status: Former Smoker    Packs/day: 1.50    Years: 6.00    Pack years: 9.00  Types: Cigarettes    Start date: 10/06/1969    Quit date: 01/21/1976    Years since quitting: 42.7  . Smokeless tobacco: Current User    Types: Snuff  . Tobacco comment: quit  36 years ago  Substance and Sexual Activity  . Alcohol use: Yes    Alcohol/week: 0.0 standard drinks    Comment: "last beer was 01/2013"  . Drug use: No  . Sexual activity: Yes  Lifestyle  . Physical activity    Days per week: Not on file    Minutes per session: Not on file  . Stress: Not on file  Relationships  . Social Herbalist on phone: Not on file    Gets together: Not on file    Attends religious service: Not on file    Active member of club or organization: Not on file    Attends meetings of clubs or organizations: Not on file    Relationship status: Not on file  . Intimate partner violence    Fear of current or ex partner: Not on file    Emotionally abused: Not on file    Physically abused: Not on file    Forced sexual activity: Not on file  Other Topics Concern  . Not on file  Social History Narrative   Was working out regularly prior to CABG/AVR.  Hunts/fishes.    Past Medical History, Surgical history, Social history, and Family history were reviewed and updated as appropriate.   Please see review of systems for further details on the patient's review from today.   Objective:   Physical Exam:  Wt 173 lb (78.5 kg)   BMI 27.92 kg/m   Physical Exam Constitutional:      General: He is not in acute distress.    Appearance: He is well-developed.  Musculoskeletal:        General: No  deformity.  Neurological:     Mental Status: He is alert and oriented to person, place, and time.     Coordination: Coordination normal.  Psychiatric:        Attention and Perception: Attention and perception normal. He does not perceive auditory or visual hallucinations.        Mood and Affect: Mood normal. Mood is not anxious or depressed. Affect is not labile, blunt, angry or inappropriate.        Speech: Speech normal.        Behavior: Behavior normal.        Thought Content: Thought content normal. Thought content is not paranoid or delusional. Thought content does not include homicidal or suicidal ideation. Thought content does not include homicidal or suicidal plan.        Cognition and Memory: Cognition and memory normal.        Judgment: Judgment normal.     Comments: Insight intact     Lab Review:     Component Value Date/Time   NA 140 09/01/2017 1018   NA 139 03/08/2014 1230   K 4.0 09/01/2017 1018   K 4.2 03/08/2014 1230   CL 105 09/01/2017 1018   CL 101 03/08/2014 1230   CO2 26 09/01/2017 1018   CO2 30 03/08/2014 1230   GLUCOSE 78 09/01/2017 1018   GLUCOSE 87 03/08/2014 1230   BUN 27 (H) 09/01/2017 1018   BUN 27 (H) 03/08/2014 1230   CREATININE 1.03 09/01/2017 1018   CREATININE 1.4 (H) 03/08/2014 1230   CALCIUM 8.9 09/01/2017 1018   CALCIUM 8.8 03/08/2014  1230   PROT 5.9 (L) 03/08/2014 1230   ALBUMIN 3.6 03/08/2014 1230   AST 24 03/08/2014 1230   ALT 23 03/08/2014 1230   ALKPHOS 51 03/08/2014 1230   BILITOT 1.00 03/08/2014 1230   GFRNONAA >60 09/01/2017 1018   GFRAA >60 09/01/2017 1018       Component Value Date/Time   WBC 7.3 09/01/2017 1018   RBC 4.87 09/01/2017 1018   HGB 16.4 09/01/2017 1018   HGB 13.7 07/03/2015 0818   HCT 46.4 09/01/2017 1018   HCT 38.3 (L) 07/03/2015 0818   PLT 129 (L) 09/01/2017 1018   PLT 146 07/03/2015 0818   MCV 95.3 09/01/2017 1018   MCV 88 07/03/2015 0818   MCH 33.7 09/01/2017 1018   MCHC 35.3 09/01/2017 1018   RDW  11.8 09/01/2017 1018   RDW 11.9 07/03/2015 0818   LYMPHSABS 1.0 09/01/2017 1018   LYMPHSABS 1.0 07/03/2015 0818   MONOABS 0.4 09/01/2017 1018   EOSABS 0.2 09/01/2017 1018   EOSABS 0.1 07/03/2015 0818   BASOSABS 0.0 09/01/2017 1018   BASOSABS 0.0 07/03/2015 0818    No results found for: POCLITH, LITHIUM   No results found for: PHENYTOIN, PHENOBARB, VALPROATE, CBMZ   .res Assessment: Plan:    Max Byrd was seen today for anxiety, depression and insomnia.  Diagnoses and all orders for this visit:  Severe major depression without psychotic features (HCC) -     DULoxetine (CYMBALTA) 30 MG capsule; Take 1 capsule (30 mg total) by mouth 2 (two) times daily. -     sertraline (ZOLOFT) 100 MG tablet; Take 1 tablet (100 mg total) by mouth daily.  Generalized anxiety disorder -     ALPRAZolam (XANAX) 1 MG tablet; Take 1 tablet (1 mg total) by mouth 4 (four) times daily. -     DULoxetine (CYMBALTA) 30 MG capsule; Take 1 capsule (30 mg total) by mouth 2 (two) times daily. -     sertraline (ZOLOFT) 100 MG tablet; Take 1 tablet (100 mg total) by mouth daily.  Insomnia, unspecified type -     zolpidem (AMBIEN) 10 MG tablet; Take 1 tablet (10 mg total) by mouth at bedtime.  Thrombocytopenia ([place])     Please see After Visit Summary for patient specific instructions.  No future appointments.  No orders of the defined types were placed in this encounter.   -------------------------------

## 2018-10-03 ENCOUNTER — Other Ambulatory Visit: Payer: Self-pay | Admitting: Adult Health

## 2018-10-03 NOTE — Telephone Encounter (Signed)
Pharmacy is requesting 60 mg bid instead of 30 mg bid?

## 2018-10-10 ENCOUNTER — Other Ambulatory Visit: Payer: Self-pay

## 2018-10-10 ENCOUNTER — Other Ambulatory Visit: Payer: Self-pay | Admitting: Adult Health

## 2018-10-10 ENCOUNTER — Telehealth: Payer: Self-pay | Admitting: Adult Health

## 2018-10-10 DIAGNOSIS — F411 Generalized anxiety disorder: Secondary | ICD-10-CM

## 2018-10-10 MED ORDER — ALPRAZOLAM 1 MG PO TABS: 1.0000 mg | ORAL_TABLET | Freq: Four times a day (QID) | ORAL | 2 refills | Status: DC

## 2018-10-10 NOTE — Telephone Encounter (Signed)
Updated Rx with [facility]

## 2018-10-10 NOTE — Telephone Encounter (Signed)
Patient called and said that the script directions said 30 pills and he takes 4 pills a day he needs 120 and onlyu got 30. He needs you to correct this and send it to [facility]

## 2018-10-31 ENCOUNTER — Other Ambulatory Visit: Payer: Self-pay | Admitting: Adult Health

## 2018-10-31 DIAGNOSIS — F411 Generalized anxiety disorder: Secondary | ICD-10-CM

## 2018-10-31 DIAGNOSIS — F322 Major depressive disorder, single episode, severe without psychotic features: Secondary | ICD-10-CM

## 2018-10-31 MED ORDER — DULOXETINE HCL 30 MG PO CPEP: 30.0000 mg | ORAL_CAPSULE | Freq: Two times a day (BID) | ORAL | 5 refills | Status: DC

## 2018-11-17 ENCOUNTER — Telehealth: Payer: Self-pay | Admitting: Interventional Cardiology

## 2018-11-17 NOTE — Telephone Encounter (Signed)
New Message  Patient is calling in to see if it is necessary to have the 4 month follow up with Dr. Tamala Julian or to see if it is ok to push the appointment out further than is recommended on the recall letter. Please give patient a call back to confirm.

## 2018-11-17 NOTE — Telephone Encounter (Signed)
Spoke with pt and he is stable since last visit.  No issues.  Would prefer to hold off on appt.  Scheduled pt to see Dr. Tamala Julian in December and advised to call back sooner if any issues.

## 2018-11-22 ENCOUNTER — Telehealth: Payer: Self-pay | Admitting: Adult Health

## 2018-11-22 DIAGNOSIS — F411 Generalized anxiety disorder: Secondary | ICD-10-CM

## 2018-11-22 DIAGNOSIS — F322 Major depressive disorder, single episode, severe without psychotic features: Secondary | ICD-10-CM

## 2018-11-22 MED ORDER — SERTRALINE HCL 100 MG PO TABS: ORAL_TABLET | ORAL | 5 refills | Status: DC

## 2018-11-22 NOTE — Telephone Encounter (Signed)
Script sent  

## 2018-11-22 NOTE — Telephone Encounter (Signed)
Almyra Free from [facility] left vm today @9 :60 stated patient need new script for Sertraline 200 mg, due to dosage being increased.

## 2018-12-14 ENCOUNTER — Other Ambulatory Visit: Payer: Self-pay | Admitting: Adult Health

## 2018-12-14 DIAGNOSIS — G4700 Insomnia, unspecified: Secondary | ICD-10-CM

## 2019-01-09 ENCOUNTER — Other Ambulatory Visit: Payer: Self-pay | Admitting: Adult Health

## 2019-01-09 DIAGNOSIS — F411 Generalized anxiety disorder: Secondary | ICD-10-CM

## 2019-02-05 ENCOUNTER — Other Ambulatory Visit: Payer: Self-pay | Admitting: Interventional Cardiology

## 2019-02-06 ENCOUNTER — Other Ambulatory Visit: Payer: Self-pay | Admitting: Interventional Cardiology

## 2019-02-06 ENCOUNTER — Other Ambulatory Visit: Payer: Self-pay | Admitting: *Deleted

## 2019-02-06 NOTE — Telephone Encounter (Signed)
Let's wait for appt. Thanks

## 2019-02-07 NOTE — Progress Notes (Signed)
Cardiology Office Note:    Date:  02/08/2019   ID:  Max Byrd, DOB 1955/12/27, MRN [id number]  PCP:  Wenda Low, MD  Cardiologist:  Sinclair Grooms, MD   Referring MD: Wenda Low, MD   Chief Complaint  Patient presents with  . Coronary Artery Disease  . Cardiac Valve Problem    History of Present Illness:    Max Byrd is a 63 y.o. male with a hx of chronic kidney disease, bicuspid aortic valve treated with pericardial tissue valve2014, hypertension, CAD with angina, circumflex stent after failed saphenous vein graftto OM 2014, CABG 02/14/13 LIMA to LAD, VG to LCX, and VC to diag. Also with depression.Recent spinal fusion.  Max Byrd feels he is a continuous path towards death.  He has gone from being able to do 5 hours of activity per day approximately 7 years ago to 1.5 hours of activity per day over the past year.  He gets out of energy, feels short of breath, cannot think straight, can play his [place], and pretty much has to sit down.  This is very disconcerting because it is taken away his enjoyment which used to include hunting, guitar playing, and physical activity.\  He has chest discomfort from time to time.  It is all subcostal now and he gets better with Tums.  He used to have a lot of midsternal chest discomfort which is not been much of an issue lately.  If he gets real severe or persist too long he will try nitroglycerin.  The pattern has not changed in months.  He is concerned that his valve is causing most of his complaints.  He charted a course of physical decline over the past 7 years since open heart surgery for valve replacement and coronary bypass grafting.  Does not feel that he is ever recovered following the invasiveness of the surgical procedure.  He denies orthopnea, PND, lower extremity swelling, palpitations, and syncope.  Past Medical History:  Diagnosis Date  . Anginal pain ([place]) 2015   "since valve was done"  . Anxiety   . Arthritis    "left pointer finger; forminal openings bilaterally" (08/10/2013)  . Bicuspid aortic valve    a. 01/2013 s/p AVR - 53mm Edwards 3300 TFX pericardial tissue valve, ser # [id number].  Marland Kitchen CKD (chronic kidney disease) stage 2, GFR 60-89 ml/min   . Coronary atherosclerosis of native coronary artery    a. 01/2013 CABGx3: LIMA->LAD, VG->Diag, VG->OM (performed @ time of AVR),  occluded SVG-OM2 , now with 95% stenosis at the anastomosis site on OM 2 - PCI attempt 07/28/13 unsuccessful - med Rx cont'd   . Depression   . Erectile dysfunction    INJECTIONS OF PROSTAGLANDIN BY UROLGIST  . Essential hypertension, benign   . GERD (gastroesophageal reflux disease)   . H/O Bell's palsy 2010 X2  . Heart murmur   . History of blood transfusion    "related to stomach bleeding from ASA & NSAIDS  . History of stomach ulcers   . Hyperlipidemia   . IBS (irritable bowel syndrome)   . Migraine    "went away in the 1990's"  . Pain, chronic postoperative    S/P LEFT ARM SURGERY  . Renal cell carcinoma of right kidney ([place]) 2003    Past Surgical History:  Procedure Laterality Date  . ANAL SPHINCTEROTOMY  04/13/03   DR. GRAPEY  . AORTIC VALVE REPLACEMENT N/A 02/14/2013   Procedure: AORTIC VALVE REPLACEMENT (AVR);  Surgeon: Collier Salina  Prescott Gum, MD;  Location: [facility];  Service: Open Heart Surgery;  Laterality: N/A;  . BUNIONECTOMY     right  02/2009  . CARDIAC CATHETERIZATION  2010; 6/12015; 07/29/2013  . CARDIAC VALVE REPLACEMENT    . COLONOSCOPY  2008   DIVERTICULOSIS  . CORONARY ANGIOPLASTY WITH STENT PLACEMENT  08/10/2013   "1"  . CORONARY ARTERY BYPASS GRAFT N/A 02/14/2013   Procedure: CORONARY ARTERY BYPASS GRAFTING (CABG);  Surgeon: Ivin Poot, MD;  Location: [facility];  Service: Open Heart Surgery;  Laterality: N/A;  . ELBOW SURGERY Left X 2  . ELBOW SURGERY Right X 2  . ELBOW SURGERY     left  2009  . EXCISIONAL HEMORRHOIDECTOMY    . HERNIA REPAIR Left 1957  . HERNIA REPAIR     06/21/17  . INTRAOPERATIVE  TRANSESOPHAGEAL ECHOCARDIOGRAM N/A 02/14/2013   Procedure: INTRAOPERATIVE TRANSESOPHAGEAL ECHOCARDIOGRAM;  Surgeon: Ivin Poot, MD;  Location: [place];  Service: Open Heart Surgery;  Laterality: N/A;  . LATERAL EPICONDYLE RELEASE Left 07/29/07   DR. GRAVES  . LEFT AND RIGHT HEART CATHETERIZATION WITH CORONARY ANGIOGRAM N/A 01/27/2013   Procedure: LEFT AND RIGHT HEART CATHETERIZATION WITH CORONARY ANGIOGRAM;  Surgeon: Sinclair Grooms, MD;  Location: [facility] CATH LAB;  Service: Cardiovascular;  Laterality: N/A;  . LEFT HEART CATHETERIZATION WITH CORONARY/GRAFT ANGIOGRAM N/A 07/28/2013   Procedure: LEFT HEART CATHETERIZATION WITH Beatrix Fetters;  Surgeon: Leonie Man, MD;  Location: [facility] CATH LAB;  Service: Cardiovascular;  Laterality: N/A;  . PARTIAL NEPHRECTOMY  08/24/02   RIGHT...DR. Risa Grill  . PERCUTANEOUS STENT INTERVENTION N/A 08/10/2013   Procedure: PERCUTANEOUS STENT INTERVENTION;  Surgeon: Sinclair Grooms, MD;  Location[facility] CATH LAB;  Service: Cardiovascular;  Laterality: N/A;  . TOE SURGERY Right    "shaved; wired back straight"  . TONSILLECTOMY      Current Medications: Current Meds  Medication Sig  . ALPRAZolam (XANAX) 1 MG tablet TAKE ONE TABLET BY MOUTH 4 TIMES DAILY  . amLODipine (NORVASC) 5 MG tablet Take 1 tablet (5 mg total) by mouth daily.  . carisoprodol (SOMA) 350 MG tablet Take 175 mg by mouth 2 (two) times daily.   . clindamycin (CLEOCIN) 300 MG capsule TAKE 2 CAPSULES 1 HOUR PRIOR TO DENTAL APPOINTMENT.  . Diclofenac-Misoprostol (ARTHROTEC) 50-0.2 MG TBEC Take 1 tablet by mouth 2 (two) times daily.  . DULoxetine (CYMBALTA) 60 MG capsule Take 60 mg by mouth 2 (two) times daily.  . furosemide (LASIX) 40 MG tablet Take 40 mg by mouth 2 (two) times daily.  . hyoscyamine (LEVSIN SL) 0.125 MG SL tablet Take 0.25 mg by mouth every 6 (six) hours as needed (IBS).   Marland Kitchen irbesartan (AVAPRO) 150 MG tablet Take 1 tablet (150 mg total) by mouth daily.  Marland Kitchen loperamide (IMODIUM  A-D) 2 MG tablet Take 4 mg by mouth 3 (three) times daily as needed for diarrhea or loose stools.  . Naphazoline HCl (CLEAR EYES OP) Place 1 drop into both eyes 3 (three) times daily.  . nitroGLYCERIN (NITROSTAT) 0.4 MG SL tablet Place 1 tablet (0.4 mg total) under the tongue every 5 (five) minutes as needed for chest pain (MAX 3 TABLETS).  Marland Kitchen oxyCODONE (OXYCONTIN) 20 mg 12 hr tablet Take 20 mg by mouth every 12 (twelve) hours.  Marland Kitchen oxyCODONE-acetaminophen (PERCOCET) 10-325 MG tablet Take 1 tablet by mouth every 8 (eight) hours.   . sertraline (ZOLOFT) 100 MG tablet Take two tablets daily.  Marland Kitchen testosterone cypionate (DEPOTESTOSTERONE  CYPIONATE) 200 MG/ML injection Inject 200 mg once every 14 days  . zolpidem (AMBIEN) 10 MG tablet TAKE ONE TABLET BY MOUTH EVERY DAY AT BEDTIME  . [DISCONTINUED] DULoxetine (CYMBALTA) 30 MG capsule Take 1 capsule (30 mg total) by mouth 2 (two) times daily.     Allergies:   Aspirin, Nsaids, Tolmetin, Zoloft [sertraline hcl], Klonopin [clonazepam], Beta adrenergic blockers, Penicillins, and Reglan [metoclopramide]   Social History   Socioeconomic History  . Marital status: Married    Spouse name: Not on file  . Number of children: Not on file  . Years of education: Not on file  . Highest education level: Not on file  Occupational History  . Not on file  Tobacco Use  . Smoking status: Former Smoker    Packs/day: 1.50    Years: 6.00    Pack years: 9.00    Types: Cigarettes    Start date: 10/06/1969    Quit date: 01/21/1976    Years since quitting: 43.0  . Smokeless tobacco: Current User    Types: Snuff  . Tobacco comment: quit  36 years ago  Substance and Sexual Activity  . Alcohol use: Yes    Alcohol/week: 0.0 standard drinks    Comment: "last beer was 01/2013"  . Drug use: No  . Sexual activity: Yes  Other Topics Concern  . Not on file  Social History Narrative   Was working out regularly prior to CABG/AVR.  Hunts/fishes.   Social Determinants of  Health   Financial Resource Strain:   . Difficulty of Paying Living Expenses: Not on file  Food Insecurity:   . Worried About Charity fundraiser in the Last Year: Not on file  . Ran Out of Food in the Last Year: Not on file  Transportation Needs:   . Lack of Transportation (Medical): Not on file  . Lack of Transportation (Non-Medical): Not on file  Physical Activity:   . Days of Exercise per Week: Not on file  . Minutes of Exercise per Session: Not on file  Stress:   . Feeling of Stress : Not on file  Social Connections:   . Frequency of Communication with Friends and Family: Not on file  . Frequency of Social Gatherings with Friends and Family: Not on file  . Attends Religious Services: Not on file  . Active Member of Clubs or Organizations: Not on file  . Attends Archivist Meetings: Not on file  . Marital Status: Not on file     Family History: The patient's family history includes Cancer in his mother; Heart disease in his father.  ROS:   Please see the history of present illness.    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  He is progressively lordotic and posterior.  He has severe insomnia and is getting no more than 3 to 4 hours of sleep per day.  He has chronic pain.  He also feels that much has changed since his medications have been taken away.  He has a psychiatrist but has not talked to her about his sleep deprivation.  He also has chronic diarrhea.  All other systems reviewed and are negative.  EKGs/Labs/Other Studies Reviewed:    The following studies were reviewed today:  Doppler echocardiogram June 2020 IMPRESSIONS    1. The left ventricle has normal systolic function with an ejection fraction of 60-65%. The cavity size was normal. Left ventricular diastolic Doppler parameters are consistent with impaired relaxation.  2. The right  ventricle has normal systolic function. The cavity was normal. There is no increase in right ventricular wall  thickness.  3. The mitral valve is degenerative. Mild thickening of the mitral valve leaflet. There is moderate mitral annular calcification present.  4. Mobile echodensity distal to tricuspid valve in the RV cavity is noted (was seen on prior echocardiogram personally reviewed). Likely redundant tissue.  5. A 25mm an Edwards Model 3300 bovine valve is present in the aortic position. Procedure Date: 02/14/13 Normal aortic valve prosthesis.  6. Peak transaortic velocity is 3.39m/s, mean 39mmHg. Prior ECHO mean gradient 45mmHg.  EKG:  EKG not repeated  Recent Labs: No results found for requested labs within last 8760 hours.  Recent Lipid Panel No results found for: CHOL, TRIG, HDL, CHOLHDL, VLDL, LDLCALC, LDLDIRECT  Physical Exam:    VS:  BP 126/78   Pulse 81   Ht 5\' 6"  (1.676 m)   Wt 168 lb 14.4 oz (76.6 kg)   SpO2 97%   BMI 27.26 kg/m     Wt Readings from Last 3 Encounters:  02/08/19 168 lb 14.4 oz (76.6 kg)  07/26/18 174 lb (78.9 kg)  01/18/18 171 lb 9.6 oz (77.8 kg)     GEN: Lordotic posture.  Erythematous hands.. No acute distress HEENT: Normal.  Head is positioned forward. NECK: No JVD. LYMPHATICS: No lymphadenopathy CARDIAC: 3/6 crescendo decrescendo systolic murmur from the bioprosthetic valve.  RRR without diastolic murmur, gallop, or edema. VASCULAR:  Normal Pulses. No bruits. RESPIRATORY:  Clear to auscultation without rales, wheezing or rhonchi  ABDOMEN: Soft, non-tender, non-distended, No pulsatile mass, MUSCULOSKELETAL: Cervical and thoracic lordosis. SKIN: Warm and dry NEUROLOGIC:  Alert and oriented x 3 PSYCHIATRIC:  Normal affect   ASSESSMENT:    1. Coronary artery disease involving native coronary artery, angina presence unspecified, unspecified whether native or transplanted heart   2. Chronic diastolic heart failure ([place])   3. S/P AVR (aortic valve replacement)   4. Mixed hyperlipidemia   5. Essential hypertension   6. Other chronic pain   7.  Educated about COVID-19 virus infection    PLAN:    In order of problems listed above:  1. Secondary prevention discussed. 2. No evidence of volume overload. 3. Elevated velocity of 3.8 m/s through the prosthetic valve.  Repeat echocardiogram in 6 months. 4. LDL target less than 70.  Most recent in October was 99.  Not on statin therapy because of prior intolerance.  This needs to be discussed on next office visit and consideration given to reinstitution of therapy.  He believes the medication was stopped by one of his other physicians who suspected side effects. 5. Target 130/80 mmHg. 6. Chronic pain syndrome.  Feels he is on an adequate medical regimen for control. 7. 3W's discussed in detail to avoid COVID-19  Overall education and awareness concerning primary/secondary risk prevention was discussed in detail: LDL less than 70, hemoglobin A1c less than 7, blood pressure target less than 130/80 mmHg, >150 minutes of moderate aerobic activity per week, avoidance of smoking, weight control (via diet and exercise), and continued surveillance/management of/for obstructive sleep apnea.  Greater than 50% of the time during this office visit was spent in education, counseling, and coordination of care related to underlying disease process and testing as outlined.   Medication Adjustments/Labs and Tests Ordered: Current medicines are reviewed at length with the patient today.  Concerns regarding medicines are outlined above.  Orders Placed This Encounter  Procedures  . ECHOCARDIOGRAM COMPLETE  No orders of the defined types were placed in this encounter.   Patient Instructions  Medication Instructions:  Your physician recommends that you continue on your current medications as directed. Please refer to the Current Medication list given to you today.  *If you need a refill on your cardiac medications before your next appointment, please call your pharmacy*  Lab Work: None If you have  labs (blood work) drawn today and your tests are completely normal, you will receive your results only by: Marland Kitchen MyChart Message (if you have MyChart) OR . A paper copy in the mail If you have any lab test that is abnormal or we need to change your treatment, we will call you to review the results.  Testing/Procedures: Your physician has requested that you have an echocardiogram after 08/03/2019. Echocardiography is a painless test that uses sound waves to create images of your heart. It provides your doctor with information about the size and shape of your heart and how well your heart's chambers and valves are working. This procedure takes approximately one hour. There are no restrictions for this procedure.   Follow-Up: At [facility], you and your health needs are our priority.  As part of our continuing mission to provide you with exceptional heart care, we have created designated Provider Care Teams.  These Care Teams include your primary Cardiologist (physician) and Advanced Practice Providers (APPs -  Physician Assistants and Nurse Practitioners) who all work together to provide you with the care you need, when you need it.  Your next appointment:   9-12 month(s)  The format for your next appointment:   In Person  Provider:   You may see Sinclair Grooms, MD or one of the following Advanced Practice Providers on your designated Care Team:    Truitt Merle, NP  Cecilie Kicks, NP  Kathyrn Drown, NP   Other Instructions      Signed, Sinclair Grooms, MD  02/08/2019 11:28 AM    [place]

## 2019-02-08 ENCOUNTER — Ambulatory Visit: Payer: Federal, State, Local not specified - PPO | Admitting: Interventional Cardiology

## 2019-02-08 ENCOUNTER — Encounter: Payer: Self-pay | Admitting: Interventional Cardiology

## 2019-02-08 ENCOUNTER — Other Ambulatory Visit: Payer: Self-pay

## 2019-02-08 VITALS — BP 126/78 | HR 81 | Ht 66.0 in | Wt 168.9 lb

## 2019-02-08 DIAGNOSIS — I2510 Atherosclerotic heart disease of native coronary artery without angina pectoris: Secondary | ICD-10-CM | POA: Diagnosis not present

## 2019-02-08 DIAGNOSIS — Z952 Presence of prosthetic heart valve: Secondary | ICD-10-CM | POA: Diagnosis not present

## 2019-02-08 DIAGNOSIS — I110 Hypertensive heart disease with heart failure: Secondary | ICD-10-CM

## 2019-02-08 DIAGNOSIS — I10 Essential (primary) hypertension: Secondary | ICD-10-CM

## 2019-02-08 DIAGNOSIS — Z7189 Other specified counseling: Secondary | ICD-10-CM

## 2019-02-08 DIAGNOSIS — E782 Mixed hyperlipidemia: Secondary | ICD-10-CM | POA: Diagnosis not present

## 2019-02-08 DIAGNOSIS — G8929 Other chronic pain: Secondary | ICD-10-CM

## 2019-02-08 DIAGNOSIS — I5032 Chronic diastolic (congestive) heart failure: Secondary | ICD-10-CM

## 2019-02-08 NOTE — Patient Instructions (Addendum)
Medication Instructions:  Your physician recommends that you continue on your current medications as directed. Please refer to the Current Medication list given to you today.  *If you need a refill on your cardiac medications before your next appointment, please call your pharmacy*  Lab Work: None If you have labs (blood work) drawn today and your tests are completely normal, you will receive your results only by: Marland Kitchen MyChart Message (if you have MyChart) OR . A paper copy in the mail If you have any lab test that is abnormal or we need to change your treatment, we will call you to review the results.  Testing/Procedures: Your physician has requested that you have an echocardiogram after 08/03/2019. Echocardiography is a painless test that uses sound waves to create images of your heart. It provides your doctor with information about the size and shape of your heart and how well your heart's chambers and valves are working. This procedure takes approximately one hour. There are no restrictions for this procedure.   Follow-Up: At [facility], you and your health needs are our priority.  As part of our continuing mission to provide you with exceptional heart care, we have created designated Provider Care Teams.  These Care Teams include your primary Cardiologist (physician) and Advanced Practice Providers (APPs -  Physician Assistants and Nurse Practitioners) who all work together to provide you with the care you need, when you need it.  Your next appointment:   9-12 month(s)  The format for your next appointment:   In Person  Provider:   You may see Sinclair Grooms, MD or one of the following Advanced Practice Providers on your designated Care Team:    Truitt Merle, NP  Cecilie Kicks, NP  Kathyrn Drown, NP   Other Instructions

## 2019-03-08 ENCOUNTER — Other Ambulatory Visit: Payer: Self-pay | Admitting: Adult Health

## 2019-03-08 DIAGNOSIS — G4700 Insomnia, unspecified: Secondary | ICD-10-CM

## 2019-03-29 ENCOUNTER — Other Ambulatory Visit: Payer: Self-pay | Admitting: Adult Health

## 2019-03-29 DIAGNOSIS — F411 Generalized anxiety disorder: Secondary | ICD-10-CM

## 2019-03-30 ENCOUNTER — Other Ambulatory Visit: Payer: Self-pay

## 2019-03-30 ENCOUNTER — Encounter: Payer: Self-pay | Admitting: Adult Health

## 2019-03-30 ENCOUNTER — Ambulatory Visit (INDEPENDENT_AMBULATORY_CARE_PROVIDER_SITE_OTHER): Payer: Federal, State, Local not specified - PPO | Admitting: Adult Health

## 2019-03-30 DIAGNOSIS — F410 Panic disorder [episodic paroxysmal anxiety] without agoraphobia: Secondary | ICD-10-CM

## 2019-03-30 DIAGNOSIS — G4700 Insomnia, unspecified: Secondary | ICD-10-CM | POA: Diagnosis not present

## 2019-03-30 DIAGNOSIS — F411 Generalized anxiety disorder: Secondary | ICD-10-CM | POA: Diagnosis not present

## 2019-03-30 DIAGNOSIS — G894 Chronic pain syndrome: Secondary | ICD-10-CM | POA: Diagnosis not present

## 2019-03-30 DIAGNOSIS — F331 Major depressive disorder, recurrent, moderate: Secondary | ICD-10-CM | POA: Diagnosis not present

## 2019-03-30 NOTE — Progress Notes (Signed)
Max Byrd [id number] 07/28/1955 64 y.o.  Subjective:   Patient ID:  Max Byrd is a 64 y.o. (DOB Apr 06, 1955) male.  Chief Complaint:  Chief Complaint  Patient presents with  . Depression  . Anxiety  . Fatigue  . Insomnia  . Panic Attack    HPI Max Byrd presents to the office today for follow-up of chronic pain, depression, anxiety, and insomnia.  Describes mood today as "so-so". Reports depression, anxiety, and irritability. Stating "I hurt all over and I'm depressed". Has one good hour a day. Stating "I used to have 5 hours a day". Feels like health continues to decline. Is in stage 4 of heart failure. Stating "I'm assuming I'm getting worse". Has period of blackouts. Feels sick "a lot of times". Worse after eating. Working with cardiologist - "they say my numbers are good". Had open heart surgery in 2015. Working with pain clinic - "ongoing pain issues". Varying interest and motivation. Taking medications as prescribed. Energy levels decreased. Active, going to the gym twice a week. Disabled. Enjoys some usual interests. Married. Lives with wife of 56 years. Playing guitar an hour a day.  Appetite adequate. Weight stable. Sleeps better some nights than others - recently getting 6 hours.  Focus and concentration difficulties. Completing tasks. Managing some aspects of household. Disabled.  Denies SI or HI. Denies AH or VH.   Review of Systems:  Review of Systems  Musculoskeletal: Negative for gait problem.  Neurological: Negative for tremors.  Psychiatric/Behavioral:       Please refer to HPI    Medications: I have reviewed the patient's current medications.  Current Outpatient Medications  Medication Sig Dispense Refill  . ALPRAZolam (XANAX) 1 MG tablet TAKE ONE TABLET BY MOUTH 4 TIMES DAILY 120 tablet 2  . amLODipine (NORVASC) 5 MG tablet Take 1 tablet (5 mg total) by mouth daily. 90 tablet 3  . carisoprodol (SOMA) 350 MG tablet Take 175 mg by mouth 2 (two) times  daily.     . clindamycin (CLEOCIN) 300 MG capsule TAKE 2 CAPSULES 1 HOUR PRIOR TO DENTAL APPOINTMENT.    . Diclofenac-Misoprostol (ARTHROTEC) 50-0.2 MG TBEC Take 1 tablet by mouth 2 (two) times daily.    . DULoxetine (CYMBALTA) 60 MG capsule Take 60 mg by mouth 2 (two) times daily.    . furosemide (LASIX) 40 MG tablet Take 40 mg by mouth 2 (two) times daily.    . hyoscyamine (LEVSIN SL) 0.125 MG SL tablet Take 0.25 mg by mouth every 6 (six) hours as needed (IBS).     Marland Kitchen irbesartan (AVAPRO) 150 MG tablet Take 1 tablet (150 mg total) by mouth daily. 30 tablet 11  . loperamide (IMODIUM A-D) 2 MG tablet Take 4 mg by mouth 3 (three) times daily as needed for diarrhea or loose stools.    . Naphazoline HCl (CLEAR EYES OP) Place 1 drop into both eyes 3 (three) times daily.    . nitroGLYCERIN (NITROSTAT) 0.4 MG SL tablet Place 1 tablet (0.4 mg total) under the tongue every 5 (five) minutes as needed for chest pain (MAX 3 TABLETS). 25 tablet 6  . oxyCODONE (OXYCONTIN) 20 mg 12 hr tablet Take 20 mg by mouth every 12 (twelve) hours.    Marland Kitchen oxyCODONE-acetaminophen (PERCOCET) 10-325 MG tablet Take 1 tablet by mouth every 8 (eight) hours.     . sertraline (ZOLOFT) 100 MG tablet Take two tablets daily. 60 tablet 5  . testosterone cypionate (DEPOTESTOSTERONE CYPIONATE) 200 MG/ML injection Inject  200 mg once every 14 days  3  . zolpidem (AMBIEN) 10 MG tablet TAKE ONE TABLET BY MOUTH EVERY DAY AT BEDTIME 30 tablet 2   No current facility-administered medications for this visit.    Medication Side Effects: None  Allergies:  Allergies  Allergen Reactions  . Aspirin Other (See Comments)    H/O BLEEDING ULCER  . Nsaids Other (See Comments)    Stomach bleeding  . Tolmetin Other (See Comments)    Stomach bleeding  . Zoloft [Sertraline Hcl] Other (See Comments)    FELT TERRIBLE  . Klonopin [Clonazepam] Other (See Comments)    Erectile dysfunction  . Beta Adrenergic Blockers Nausea Only    Metoprolol  .  Penicillins Nausea And Vomiting and Other (See Comments)    Has patient had a PCN reaction causing immediate rash, facial/tongue/throat swelling, SOB or lightheadedness with hypotension: No Has patient had a PCN reaction causing severe rash involving mucus membranes or skin necrosis: No Has patient had a PCN reaction that required hospitalization: No Has patient had a PCN reaction occurring within the last 10 years: No If all of the above answers are "NO", then may proceed with Cephalosporin use.   . Reglan [Metoclopramide] Anxiety    EXCITABILITY     Past Medical History:  Diagnosis Date  . Anginal pain ([place]) 2015   "since valve was done"  . Anxiety   . Arthritis    "left pointer finger; forminal openings bilaterally" (08/10/2013)  . Bicuspid aortic valve    a. 01/2013 s/p AVR - 25mm Edwards 3300 TFX pericardial tissue valve, ser # [id number].  Marland Kitchen CKD (chronic kidney disease) stage 2, GFR 60-89 ml/min   . Coronary atherosclerosis of native coronary artery    a. 01/2013 CABGx3: LIMA->LAD, VG->Diag, VG->OM (performed @ time of AVR),  occluded SVG-OM2 , now with 95% stenosis at the anastomosis site on OM 2 - PCI attempt 07/28/13 unsuccessful - med Rx cont'd   . Depression   . Erectile dysfunction    INJECTIONS OF PROSTAGLANDIN BY UROLGIST  . Essential hypertension, benign   . GERD (gastroesophageal reflux disease)   . H/O Bell's palsy 2010 X2  . Heart murmur   . History of blood transfusion    "related to stomach bleeding from ASA & NSAIDS  . History of stomach ulcers   . Hyperlipidemia   . IBS (irritable bowel syndrome)   . Migraine    "went away in the 1990's"  . Pain, chronic postoperative    S/P LEFT ARM SURGERY  . Renal cell carcinoma of right kidney ([place]) 2003    Family History  Problem Relation Age of Onset  . Cancer Mother        BREAST/OVARIAN  . Heart disease Father        S/P MI/CABG    Social History   Socioeconomic History  . Marital status: Married     Spouse name: Not on file  . Number of children: Not on file  . Years of education: Not on file  . Highest education level: Not on file  Occupational History  . Not on file  Tobacco Use  . Smoking status: Former Smoker    Packs/day: 1.50    Years: 6.00    Pack years: 9.00    Types: Cigarettes    Start date: 10/06/1969    Quit date: 01/21/1976    Years since quitting: 43.2  . Smokeless tobacco: Current User    Types: Snuff  . Tobacco comment:  quit  36 years ago  Substance and Sexual Activity  . Alcohol use: Yes    Alcohol/week: 0.0 standard drinks    Comment: "last beer was 01/2013"  . Drug use: No  . Sexual activity: Yes  Other Topics Concern  . Not on file  Social History Narrative   Was working out regularly prior to CABG/AVR.  Hunts/fishes.   Social Determinants of Health   Financial Resource Strain:   . Difficulty of Paying Living Expenses: Not on file  Food Insecurity:   . Worried About Charity fundraiser in the Last Year: Not on file  . Ran Out of Food in the Last Year: Not on file  Transportation Needs:   . Lack of Transportation (Medical): Not on file  . Lack of Transportation (Non-Medical): Not on file  Physical Activity:   . Days of Exercise per Week: Not on file  . Minutes of Exercise per Session: Not on file  Stress:   . Feeling of Stress : Not on file  Social Connections:   . Frequency of Communication with Friends and Family: Not on file  . Frequency of Social Gatherings with Friends and Family: Not on file  . Attends Religious Services: Not on file  . Active Member of Clubs or Organizations: Not on file  . Attends Archivist Meetings: Not on file  . Marital Status: Not on file  Intimate Partner Violence:   . Fear of Current or Ex-Partner: Not on file  . Emotionally Abused: Not on file  . Physically Abused: Not on file  . Sexually Abused: Not on file    Past Medical History, Surgical history, Social history, and Family history were  reviewed and updated as appropriate.   Please see review of systems for further details on the patient's review from today.   Objective:   Physical Exam:  There were no vitals taken for this visit.  Physical Exam Constitutional:      General: He is not in acute distress.    Appearance: He is well-developed.  Musculoskeletal:        General: No deformity.  Neurological:     Mental Status: He is alert and oriented to person, place, and time.     Coordination: Coordination normal.  Psychiatric:        Attention and Perception: Attention and perception normal. He does not perceive auditory or visual hallucinations.        Mood and Affect: Mood normal. Mood is not anxious or depressed. Affect is not labile, blunt, angry or inappropriate.        Speech: Speech normal.        Behavior: Behavior normal.        Thought Content: Thought content normal. Thought content is not paranoid or delusional. Thought content does not include homicidal or suicidal ideation. Thought content does not include homicidal or suicidal plan.        Cognition and Memory: Cognition and memory normal.        Judgment: Judgment normal.     Comments: Insight intact     Lab Review:     Component Value Date/Time   NA 140 09/01/2017 1018   NA 139 03/08/2014 1230   K 4.0 09/01/2017 1018   K 4.2 03/08/2014 1230   CL 105 09/01/2017 1018   CL 101 03/08/2014 1230   CO2 26 09/01/2017 1018   CO2 30 03/08/2014 1230   GLUCOSE 78 09/01/2017 1018   GLUCOSE 87 03/08/2014 1230  BUN 27 (H) 09/01/2017 1018   BUN 27 (H) 03/08/2014 1230   CREATININE 1.03 09/01/2017 1018   CREATININE 1.4 (H) 03/08/2014 1230   CALCIUM 8.9 09/01/2017 1018   CALCIUM 8.8 03/08/2014 1230   PROT 5.9 (L) 03/08/2014 1230   ALBUMIN 3.6 03/08/2014 1230   AST 24 03/08/2014 1230   ALT 23 03/08/2014 1230   ALKPHOS 51 03/08/2014 1230   BILITOT 1.00 03/08/2014 1230   GFRNONAA >60 09/01/2017 1018   GFRAA >60 09/01/2017 1018       Component  Value Date/Time   WBC 7.3 09/01/2017 1018   RBC 4.87 09/01/2017 1018   HGB 16.4 09/01/2017 1018   HGB 13.7 07/03/2015 0818   HCT 46.4 09/01/2017 1018   HCT 38.3 (L) 07/03/2015 0818   PLT 129 (L) 09/01/2017 1018   PLT 146 07/03/2015 0818   MCV 95.3 09/01/2017 1018   MCV 88 07/03/2015 0818   MCH 33.7 09/01/2017 1018   MCHC 35.3 09/01/2017 1018   RDW 11.8 09/01/2017 1018   RDW 11.9 07/03/2015 0818   LYMPHSABS 1.0 09/01/2017 1018   LYMPHSABS 1.0 07/03/2015 0818   MONOABS 0.4 09/01/2017 1018   EOSABS 0.2 09/01/2017 1018   EOSABS 0.1 07/03/2015 0818   BASOSABS 0.0 09/01/2017 1018   BASOSABS 0.0 07/03/2015 0818    No results found for: POCLITH, LITHIUM   No results found for: PHENYTOIN, PHENOBARB, VALPROATE, CBMZ   .res Assessment: Plan:    Plan:  PDMP reviewed  1. Ambien 10mg  at hs 2. Xanax 1mg  4 x daily 3. Zoloft 200mg  daily  RTC 6 months  Patient advised to contact office with any questions, adverse effects, or acute worsening in signs and symptoms.  Discussed potential benefits, risk, and side effects of benzodiazepines to include potential risk of tolerance and dependence, as well as possible drowsiness.  Advised patient not to drive if experiencing drowsiness and to take lowest possible effective dose to minimize risk of dependence and tolerance.   Ceaser was seen today for depression, anxiety, fatigue, insomnia and panic attack.  Diagnoses and all orders for this visit:  Major depressive disorder, recurrent episode, moderate (HCC)  Insomnia, unspecified type  Generalized anxiety disorder  Chronic pain syndrome  Panic attacks     Please see After Visit Summary for patient specific instructions.  No future appointments.  No orders of the defined types were placed in this encounter.   -------------------------------

## 2019-04-07 ENCOUNTER — Other Ambulatory Visit: Payer: Self-pay | Admitting: Adult Health

## 2019-04-13 ENCOUNTER — Other Ambulatory Visit: Payer: Self-pay | Admitting: Interventional Cardiology

## 2019-04-13 ENCOUNTER — Other Ambulatory Visit: Payer: Self-pay | Admitting: Adult Health

## 2019-05-15 ENCOUNTER — Other Ambulatory Visit: Payer: Self-pay | Admitting: Adult Health

## 2019-05-15 DIAGNOSIS — F411 Generalized anxiety disorder: Secondary | ICD-10-CM

## 2019-05-15 DIAGNOSIS — F322 Major depressive disorder, single episode, severe without psychotic features: Secondary | ICD-10-CM

## 2019-05-31 ENCOUNTER — Other Ambulatory Visit: Payer: Self-pay | Admitting: Adult Health

## 2019-05-31 DIAGNOSIS — G4700 Insomnia, unspecified: Secondary | ICD-10-CM

## 2019-06-21 ENCOUNTER — Other Ambulatory Visit: Payer: Self-pay | Admitting: Adult Health

## 2019-06-21 DIAGNOSIS — F411 Generalized anxiety disorder: Secondary | ICD-10-CM

## 2019-08-07 ENCOUNTER — Other Ambulatory Visit: Payer: Self-pay

## 2019-08-07 ENCOUNTER — Ambulatory Visit (HOSPITAL_COMMUNITY): Payer: Federal, State, Local not specified - PPO | Attending: Cardiology

## 2019-08-07 DIAGNOSIS — I5032 Chronic diastolic (congestive) heart failure: Secondary | ICD-10-CM | POA: Diagnosis not present

## 2019-08-21 ENCOUNTER — Other Ambulatory Visit: Payer: Self-pay | Admitting: Adult Health

## 2019-08-21 DIAGNOSIS — F411 Generalized anxiety disorder: Secondary | ICD-10-CM

## 2019-09-18 ENCOUNTER — Other Ambulatory Visit: Payer: Self-pay | Admitting: Adult Health

## 2019-09-18 ENCOUNTER — Telehealth: Payer: Self-pay | Admitting: *Deleted

## 2019-09-18 DIAGNOSIS — G4700 Insomnia, unspecified: Secondary | ICD-10-CM

## 2019-09-18 NOTE — Telephone Encounter (Signed)
   Primary Cardiologist: Sinclair Grooms, MD  Chart reviewed as part of pre-operative protocol coverage. Testosterone injection are recognized in guidelines as low risk that do not typically require specific preoperative testing or holding of blood thinner therapy. Therefore, given past medical history and time since last visit, based on ACC/AHA guidelines, LOGUN COLAVITO would be at acceptable risk for the planned procedure without further cardiovascular testing.   I will route this recommendation to the requesting party via Epic fax function and remove from pre-op pool.  Please call with questions.  Loel Dubonnet, NP 09/18/2019, 11:04 AM

## 2019-09-18 NOTE — Telephone Encounter (Signed)
   [facility] Pre-operative Risk Assessment    HEARTCARE STAFF: - Please ensure there is not already an duplicate clearance open for this procedure. - Under Visit Info/Reason for Call, type in Other and utilize the format Clearance MM/DD/YY or Clearance TBD. Do not use dashes or single digits. - If request is for dental extraction, please clarify the # of teeth to be extracted.  Request for surgical clearance:  1. What type of surgery is being performed? TESTOSTERONE INJECTION   2. When is this surgery scheduled? TBD   3. What type of clearance is required (medical clearance vs. Pharmacy clearance to hold med vs. Both)? MEDICAL  4. Are there any medications that need to be held prior to surgery and how long? NONE LISTED   5. Practice name and name of physician performing surgery? [facility]; MD NOT LISTED   6. What is the office phone number? [phone]   7.   What is the office fax number? [phone]  8.   Anesthesia type (None, local, MAC, general) ? NONE LISTED   Julaine Hua 09/18/2019, 9:35 AM  _________________________________________________________________   (provider comments below)

## 2019-09-27 ENCOUNTER — Ambulatory Visit (INDEPENDENT_AMBULATORY_CARE_PROVIDER_SITE_OTHER): Payer: Federal, State, Local not specified - PPO | Admitting: Adult Health

## 2019-09-27 ENCOUNTER — Encounter: Payer: Self-pay | Admitting: Adult Health

## 2019-09-27 ENCOUNTER — Other Ambulatory Visit: Payer: Self-pay

## 2019-09-27 DIAGNOSIS — G4700 Insomnia, unspecified: Secondary | ICD-10-CM

## 2019-09-27 DIAGNOSIS — F411 Generalized anxiety disorder: Secondary | ICD-10-CM | POA: Diagnosis not present

## 2019-09-27 DIAGNOSIS — F331 Major depressive disorder, recurrent, moderate: Secondary | ICD-10-CM | POA: Diagnosis not present

## 2019-09-27 DIAGNOSIS — F410 Panic disorder [episodic paroxysmal anxiety] without agoraphobia: Secondary | ICD-10-CM | POA: Diagnosis not present

## 2019-09-27 MED ORDER — SERTRALINE HCL 100 MG PO TABS: 200.0000 mg | ORAL_TABLET | Freq: Every day | ORAL | 5 refills | Status: DC

## 2019-09-27 NOTE — Progress Notes (Signed)
CRIXUS MCAULAY [id number] 11-27-1955 64 y.o.  Subjective:   Patient ID:  Max Byrd is a 64 y.o. (DOB October 17, 1955) male.  Chief Complaint: No chief complaint on file.   HPI Max Byrd presents to the office today for follow-up of chronic pain, depression, anxiety, panic attacks and insomnia.  Describes mood today as "not great". Pleasant. Reports depression, anxiety, and irritability. Stating "I'm still fighting depression". Doesn't have any "activities" he can do. Stating "I used to do a lot to sustain myself, now I can't". Plans to restart the Rexulti. Started on Testosterone - receives injection every 3 weeks - felt like it was more helpful when receiving bi-weekly. Chronic pain issues - followed by pain management. Followed by cardiology for heart failure. Varying interest and motivation. Taking medications as prescribed. Energy levels decreased. Active, going to the gym twice a week.  Enjoys some usual interests. Married. Lives with wife of 61 years. Playing guitar an hour a day.  Appetite adequate. Weight stable. Sleeps better some nights than others - recently getting 6 hours.  Focus and concentration difficulties. Completing tasks. Managing some aspects of household - laundry. Disabled.  Denies SI or HI. Denies AH or VH.   Review of Systems:  Review of Systems  Musculoskeletal: Negative for gait problem.  Neurological: Negative for tremors.  Psychiatric/Behavioral:       Please refer to HPI    Medications: I have reviewed the patient's current medications.  Current Outpatient Medications  Medication Sig Dispense Refill  . ALPRAZolam (XANAX) 1 MG tablet TAKE ONE TABLET BY MOUTH 4 TIMES DAILY 120 tablet 2  . amLODipine (NORVASC) 5 MG tablet Take 1 tablet (5 mg total) by mouth daily. 90 tablet 3  . carisoprodol (SOMA) 350 MG tablet Take 175 mg by mouth 2 (two) times daily.     . clindamycin (CLEOCIN) 300 MG capsule TAKE 2 CAPSULES 1 HOUR PRIOR TO DENTAL APPOINTMENT.    .  Diclofenac-Misoprostol (ARTHROTEC) 50-0.2 MG TBEC Take 1 tablet by mouth 2 (two) times daily.    . DULoxetine (CYMBALTA) 60 MG capsule TAKE 2 CAPSULES BY MOUTH EVERY DAY 60 capsule 5  . furosemide (LASIX) 40 MG tablet Take 40 mg by mouth 2 (two) times daily.    . hyoscyamine (LEVSIN SL) 0.125 MG SL tablet Take 0.25 mg by mouth every 6 (six) hours as needed (IBS).     Marland Kitchen irbesartan (AVAPRO) 150 MG tablet Take 1 tablet (150 mg total) by mouth daily. 30 tablet 11  . loperamide (IMODIUM A-D) 2 MG tablet Take 4 mg by mouth 3 (three) times daily as needed for diarrhea or loose stools.    . Naphazoline HCl (CLEAR EYES OP) Place 1 drop into both eyes 3 (three) times daily.    . nitroGLYCERIN (NITROSTAT) 0.4 MG SL tablet Place 1 tablet (0.4 mg total) under the tongue every 5 (five) minutes as needed for chest pain (MAX 3 TABLETS). 25 tablet 3  . oxyCODONE (OXYCONTIN) 20 mg 12 hr tablet Take 20 mg by mouth every 12 (twelve) hours.    Marland Kitchen oxyCODONE-acetaminophen (PERCOCET) 10-325 MG tablet Take 1 tablet by mouth every 8 (eight) hours.     . sertraline (ZOLOFT) 100 MG tablet Take 2 tablets (200 mg total) by mouth daily. 60 tablet 5  . testosterone cypionate (DEPOTESTOSTERONE CYPIONATE) 200 MG/ML injection Inject 200 mg once every 14 days  3  . zolpidem (AMBIEN) 10 MG tablet TAKE ONE TABLET BY MOUTH EVERY DAY AT BEDTIME 30  tablet 2   No current facility-administered medications for this visit.    Medication Side Effects: None  Allergies:  Allergies  Allergen Reactions  . Aspirin Other (See Comments)    H/O BLEEDING ULCER  . Nsaids Other (See Comments)    Stomach bleeding  . Tolmetin Other (See Comments)    Stomach bleeding  . Zoloft [Sertraline Hcl] Other (See Comments)    FELT TERRIBLE  . Klonopin [Clonazepam] Other (See Comments)    Erectile dysfunction  . Beta Adrenergic Blockers Nausea Only    Metoprolol  . Penicillins Nausea And Vomiting and Other (See Comments)    Has patient had a PCN  reaction causing immediate rash, facial/tongue/throat swelling, SOB or lightheadedness with hypotension: No Has patient had a PCN reaction causing severe rash involving mucus membranes or skin necrosis: No Has patient had a PCN reaction that required hospitalization: No Has patient had a PCN reaction occurring within the last 10 years: No If all of the above answers are "NO", then may proceed with Cephalosporin use.   . Reglan [Metoclopramide] Anxiety    EXCITABILITY     Past Medical History:  Diagnosis Date  . Anginal pain ([place]) 2015   "since valve was done"  . Anxiety   . Arthritis    "left pointer finger; forminal openings bilaterally" (08/10/2013)  . Bicuspid aortic valve    a. 01/2013 s/p AVR - 86mm Edwards 3300 TFX pericardial tissue valve, ser # [id number].  Marland Kitchen CKD (chronic kidney disease) stage 2, GFR 60-89 ml/min   . Coronary atherosclerosis of native coronary artery    a. 01/2013 CABGx3: LIMA->LAD, VG->Diag, VG->OM (performed @ time of AVR),  occluded SVG-OM2 , now with 95% stenosis at the anastomosis site on OM 2 - PCI attempt 07/28/13 unsuccessful - med Rx cont'd   . Depression   . Erectile dysfunction    INJECTIONS OF PROSTAGLANDIN BY UROLGIST  . Essential hypertension, benign   . GERD (gastroesophageal reflux disease)   . H/O Bell's palsy 2010 X2  . Heart murmur   . History of blood transfusion    "related to stomach bleeding from ASA & NSAIDS  . History of stomach ulcers   . Hyperlipidemia   . IBS (irritable bowel syndrome)   . Migraine    "went away in the 1990's"  . Pain, chronic postoperative    S/P LEFT ARM SURGERY  . Renal cell carcinoma of right kidney ([place]) 2003    Family History  Problem Relation Age of Onset  . Cancer Mother        BREAST/OVARIAN  . Heart disease Father        S/P MI/CABG    Social History   Socioeconomic History  . Marital status: Married    Spouse name: Not on file  . Number of children: Not on file  . Years of education:  Not on file  . Highest education level: Not on file  Occupational History  . Not on file  Tobacco Use  . Smoking status: Former Smoker    Packs/day: 1.50    Years: 6.00    Pack years: 9.00    Types: Cigarettes    Start date: 10/06/1969    Quit date: 01/21/1976    Years since quitting: 43.7  . Smokeless tobacco: Current User    Types: Snuff  . Tobacco comment: quit  36 years ago  Vaping Use  . Vaping Use: Never used  Substance and Sexual Activity  . Alcohol use: Yes  Alcohol/week: 0.0 standard drinks    Comment: "last beer was 01/2013"  . Drug use: No  . Sexual activity: Yes  Other Topics Concern  . Not on file  Social History Narrative   Was working out regularly prior to CABG/AVR.  Hunts/fishes.   Social Determinants of Health   Financial Resource Strain:   . Difficulty of Paying Living Expenses:   Food Insecurity:   . Worried About Charity fundraiser in the Last Year:   . Arboriculturist in the Last Year:   Transportation Needs:   . Film/video editor (Medical):   Marland Kitchen Lack of Transportation (Non-Medical):   Physical Activity:   . Days of Exercise per Week:   . Minutes of Exercise per Session:   Stress:   . Feeling of Stress :   Social Connections:   . Frequency of Communication with Friends and Family:   . Frequency of Social Gatherings with Friends and Family:   . Attends Religious Services:   . Active Member of Clubs or Organizations:   . Attends Archivist Meetings:   Marland Kitchen Marital Status:   Intimate Partner Violence:   . Fear of Current or Ex-Partner:   . Emotionally Abused:   Marland Kitchen Physically Abused:   . Sexually Abused:     Past Medical History, Surgical history, Social history, and Family history were reviewed and updated as appropriate.   Please see review of systems for further details on the patient's review from today.   Objective:   Physical Exam:  There were no vitals taken for this visit.  Physical Exam Constitutional:       General: He is not in acute distress. Musculoskeletal:        General: No deformity.  Neurological:     Mental Status: He is alert and oriented to person, place, and time.     Coordination: Coordination normal.  Psychiatric:        Attention and Perception: Attention and perception normal. He does not perceive auditory or visual hallucinations.        Mood and Affect: Mood is anxious and depressed. Affect is not labile, blunt, angry or inappropriate.        Speech: Speech normal.        Behavior: Behavior normal.        Thought Content: Thought content normal. Thought content is not paranoid or delusional. Thought content does not include homicidal or suicidal ideation. Thought content does not include homicidal or suicidal plan.        Cognition and Memory: Cognition and memory normal.        Judgment: Judgment normal.     Comments: Insight intact     Lab Review:     Component Value Date/Time   NA 140 09/01/2017 1018   NA 139 03/08/2014 1230   K 4.0 09/01/2017 1018   K 4.2 03/08/2014 1230   CL 105 09/01/2017 1018   CL 101 03/08/2014 1230   CO2 26 09/01/2017 1018   CO2 30 03/08/2014 1230   GLUCOSE 78 09/01/2017 1018   GLUCOSE 87 03/08/2014 1230   BUN 27 (H) 09/01/2017 1018   BUN 27 (H) 03/08/2014 1230   CREATININE 1.03 09/01/2017 1018   CREATININE 1.4 (H) 03/08/2014 1230   CALCIUM 8.9 09/01/2017 1018   CALCIUM 8.8 03/08/2014 1230   PROT 5.9 (L) 03/08/2014 1230   ALBUMIN 3.6 03/08/2014 1230   AST 24 03/08/2014 1230   ALT 23 03/08/2014 1230  ALKPHOS 51 03/08/2014 1230   BILITOT 1.00 03/08/2014 1230   GFRNONAA >60 09/01/2017 1018   GFRAA >60 09/01/2017 1018       Component Value Date/Time   WBC 7.3 09/01/2017 1018   RBC 4.87 09/01/2017 1018   HGB 16.4 09/01/2017 1018   HGB 13.7 07/03/2015 0818   HCT 46.4 09/01/2017 1018   HCT 38.3 (L) 07/03/2015 0818   PLT 129 (L) 09/01/2017 1018   PLT 146 07/03/2015 0818   MCV 95.3 09/01/2017 1018   MCV 88 07/03/2015 0818    MCH 33.7 09/01/2017 1018   MCHC 35.3 09/01/2017 1018   RDW 11.8 09/01/2017 1018   RDW 11.9 07/03/2015 0818   LYMPHSABS 1.0 09/01/2017 1018   LYMPHSABS 1.0 07/03/2015 0818   MONOABS 0.4 09/01/2017 1018   EOSABS 0.2 09/01/2017 1018   EOSABS 0.1 07/03/2015 0818   BASOSABS 0.0 09/01/2017 1018   BASOSABS 0.0 07/03/2015 0818    No results found for: POCLITH, LITHIUM   No results found for: PHENYTOIN, PHENOBARB, VALPROATE, CBMZ   .res Assessment: Plan:    Plan:  PDMP reviewed  1. Ambien 10mg  at hs 2. Xanax 1mg  4 x daily 3. Zoloft 200mg  daily  RTC 6 months  Patient advised to contact office with any questions, adverse effects, or acute worsening in signs and symptoms.  Discussed potential benefits, risk, and side effects of benzodiazepines to include potential risk of tolerance and dependence, as well as possible drowsiness. Advised patient not to drive if experiencing drowsiness and to take lowest possible effective dose to minimize risk of dependence and tolerance.   Diagnoses and all orders for this visit:  Generalized anxiety disorder -     sertraline (ZOLOFT) 100 MG tablet; Take 2 tablets (200 mg total) by mouth daily.  Insomnia, unspecified type  Major depressive disorder, recurrent episode, moderate (HCC)  Panic attacks     Please see After Visit Summary for patient specific instructions.  No future appointments.  No orders of the defined types were placed in this encounter.   -------------------------------

## 2019-10-07 ENCOUNTER — Other Ambulatory Visit: Payer: Self-pay | Admitting: Adult Health

## 2019-10-30 ENCOUNTER — Telehealth: Payer: Self-pay | Admitting: Adult Health

## 2019-10-30 NOTE — Telephone Encounter (Signed)
He had some old samples he was going to restart. Verify what he is taking and ok to send in.

## 2019-10-30 NOTE — Telephone Encounter (Signed)
Max Byrd, please call and get his dose and ask how he's doing on it

## 2019-10-30 NOTE — Telephone Encounter (Signed)
[facility] in [place] called to request refill of Max Byrd's Rexulti. Max Byrd had called them to request the refill but they hadn't filled it before so couldn't request as escribe.  Please send prescription for this medication.

## 2019-10-30 NOTE — Telephone Encounter (Signed)
I do not see this listed on your last office note?

## 2019-10-30 NOTE — Telephone Encounter (Signed)
Left him a vm to return my call.  

## 2019-10-31 ENCOUNTER — Telehealth: Payer: Self-pay | Admitting: Adult Health

## 2019-10-31 ENCOUNTER — Other Ambulatory Visit: Payer: Self-pay

## 2019-10-31 MED ORDER — BREXPIPRAZOLE 1 MG PO TABS: 1.0000 mg | ORAL_TABLET | Freq: Every day | ORAL | 2 refills | Status: DC

## 2019-10-31 NOTE — Telephone Encounter (Signed)
[facility] called requesting refills on Rexulti. Phone # [phone].

## 2019-10-31 NOTE — Telephone Encounter (Signed)
Pt returned called. He is currently taking Rexulti 1 mg @ night. Helps with him sleeping. Please send Rx to [facility]

## 2019-10-31 NOTE — Telephone Encounter (Signed)
Pt called back and I sent info to nurse. Saw this message after sent.

## 2019-10-31 NOTE — Telephone Encounter (Signed)
[facility] LM pt requesting refill on Rexulti. [phone]

## 2019-10-31 NOTE — Telephone Encounter (Signed)
Rx has been sent. Thanks.  

## 2019-10-31 NOTE — Telephone Encounter (Signed)
Thank you Max Byrd  Rx for Rexulti 1 mg daily sent to [facility]. A prior authorization may be needed, will wait for pharmacy to initiate

## 2019-11-13 ENCOUNTER — Other Ambulatory Visit: Payer: Self-pay | Admitting: Adult Health

## 2019-11-13 DIAGNOSIS — F411 Generalized anxiety disorder: Secondary | ICD-10-CM

## 2019-11-15 ENCOUNTER — Other Ambulatory Visit: Payer: Self-pay | Admitting: Adult Health

## 2019-11-15 DIAGNOSIS — G4700 Insomnia, unspecified: Secondary | ICD-10-CM

## 2020-01-27 ENCOUNTER — Other Ambulatory Visit: Payer: Self-pay | Admitting: Interventional Cardiology

## 2020-01-29 ENCOUNTER — Other Ambulatory Visit: Payer: Self-pay | Admitting: Adult Health

## 2020-01-29 DIAGNOSIS — F411 Generalized anxiety disorder: Secondary | ICD-10-CM

## 2020-01-29 DIAGNOSIS — G4700 Insomnia, unspecified: Secondary | ICD-10-CM

## 2020-01-29 MED ORDER — BREXPIPRAZOLE 1 MG PO TABS
1.0000 mg | ORAL_TABLET | Freq: Every day | ORAL | 2 refills | Status: DC
Start: 2020-01-29 — End: 2021-07-31

## 2020-01-29 MED ORDER — ZOLPIDEM TARTRATE 10 MG PO TABS: 10.0000 mg | ORAL_TABLET | Freq: Every day | ORAL | 2 refills | Status: DC

## 2020-01-29 MED ORDER — SERTRALINE HCL 100 MG PO TABS: 200.0000 mg | ORAL_TABLET | Freq: Every day | ORAL | 5 refills | Status: DC

## 2020-01-29 MED ORDER — ALPRAZOLAM 1 MG PO TABS: 1.0000 mg | ORAL_TABLET | Freq: Four times a day (QID) | ORAL | 2 refills | Status: DC

## 2020-02-21 ENCOUNTER — Other Ambulatory Visit: Payer: Self-pay | Admitting: Interventional Cardiology

## 2020-03-07 ENCOUNTER — Other Ambulatory Visit: Payer: Self-pay | Admitting: Interventional Cardiology

## 2020-03-15 NOTE — Progress Notes (Signed)
CARDIOLOGY OFFICE NOTE  Date:  03/20/2020    Max Byrd Date of Birth: [birth year] Medical Record [id number]  PCP:  Wenda Low, MD  Cardiologist:  Tamala Julian  Chief Complaint  Patient presents with  . Follow-up    Seen for Dr. Tamala Julian    History of Present Illness: Max Byrd is a 65 y.o. male who presents today for a follow up visit. Seen for Dr. Tamala Julian.   He has a history of known CKD, bicuspid aortic valve treated with pericardial tissue valve in 2014, HTN, known CAD with prior CABG 02/14/13 with LIMA to LAD, SVG to OM 2, and SVG to diagonal - subsequent failed initial PCI with occluded SVG to OM - but then able to have DES to OM1 (prolonged and complicated PCI). Other issues with depression, renal cell carcinoma and chronic back pain.  Last seen in December of 2020 - noted that he felt "like he was on a continuous path towards death. Decreased endurance and some chest pain that was better with using TUMS. He has had a chronic pattern of chest pain that had not felt to have changed".   Comes in today. Here alone. BP is high. Has had his medicines this morning. Dealing with insurance regarding his chronic opiates. Tans once a week to hide his scars. He notes he is all turned around due to testosterone dosing - it had been cut back by urology - he feels this was politically driven. He notes his heart "is suffering the consequences". His testosterone prior dose was helping him deal with pain. Lots of back/neck pain that are now worse. He continues to have swelling. He continues to have neuropathy. He notes chest pain - he is not sure if this is anxiety or from his heart - can last all day - but had been intermittent - but now more frequent. He does not like to use NTG due to side effects (GI) - says his heart surgery messed up his digestive tract. He says he has seen "the end of his active days". No exercise. BP sounds like has been running high - he says he really does not care - life is  "not fun" for him. Sounds like he stopped his Norvasc for a while - now back on - not really interested in taking more BP medicines. Says he needs no refills.   He wants me to just "say he is the same" - he feels like he is about the same as last year.   Was upset that he could not get an earlier appointment and says no one called him back from our office.   Past Medical History:  Diagnosis Date  . Anginal pain ([place]) 2015   "since valve was done"  . Anxiety   . Arthritis    "left pointer finger; forminal openings bilaterally" (08/10/2013)  . Bicuspid aortic valve    a. 01/2013 s/p AVR - 55mm Edwards 3300 TFX pericardial tissue valve, ser # [id number].  Marland Kitchen CKD (chronic kidney disease) stage 2, GFR 60-89 ml/min   . Coronary atherosclerosis of native coronary artery    a. 01/2013 CABGx3: LIMA->LAD, VG->Diag, VG->OM (performed @ time of AVR),  occluded SVG-OM2 , now with 95% stenosis at the anastomosis site on OM 2 - PCI attempt 07/28/13 unsuccessful - med Rx cont'd   . Depression   . Erectile dysfunction    INJECTIONS OF PROSTAGLANDIN BY UROLGIST  . Essential hypertension, benign   . GERD (gastroesophageal reflux  disease)   . H/O Bell's palsy 2010 X2  . Heart murmur   . History of blood transfusion    "related to stomach bleeding from ASA & NSAIDS  . History of stomach ulcers   . Hyperlipidemia   . IBS (irritable bowel syndrome)   . Migraine    "went away in the 1990's"  . Pain, chronic postoperative    S/P LEFT ARM SURGERY  . Renal cell carcinoma of right kidney ([place]) 2003    Past Surgical History:  Procedure Laterality Date  . ANAL SPHINCTEROTOMY  04/13/03   DR. GRAPEY  . AORTIC VALVE REPLACEMENT N/A 02/14/2013   Procedure: AORTIC VALVE REPLACEMENT (AVR);  Surgeon: Ivin Poot, MD;  Location: [facility];  Service: Open Heart Surgery;  Laterality: N/A;  . BUNIONECTOMY     right  02/2009  . CARDIAC CATHETERIZATION  2010; 6/12015; 07/29/2013  . CARDIAC VALVE REPLACEMENT    .  COLONOSCOPY  2008   DIVERTICULOSIS  . CORONARY ANGIOPLASTY WITH STENT PLACEMENT  08/10/2013   "1"  . CORONARY ARTERY BYPASS GRAFT N/A 02/14/2013   Procedure: CORONARY ARTERY BYPASS GRAFTING (CABG);  Surgeon: Ivin Poot, MD;  Location: [facility];  Service: Open Heart Surgery;  Laterality: N/A;  . ELBOW SURGERY Left X 2  . ELBOW SURGERY Right X 2  . ELBOW SURGERY     left  2009  . EXCISIONAL HEMORRHOIDECTOMY    . HERNIA REPAIR Left 1957  . HERNIA REPAIR     06/21/17  . INTRAOPERATIVE TRANSESOPHAGEAL ECHOCARDIOGRAM N/A 02/14/2013   Procedure: INTRAOPERATIVE TRANSESOPHAGEAL ECHOCARDIOGRAM;  Surgeon: Ivin Poot, MD;  Location: [facility];  Service: Open Heart Surgery;  Laterality: N/A;  . LATERAL EPICONDYLE RELEASE Left 07/29/07   DR. GRAVES  . LEFT AND RIGHT HEART CATHETERIZATION WITH CORONARY ANGIOGRAM N/A 01/27/2013   Procedure: LEFT AND RIGHT HEART CATHETERIZATION WITH CORONARY ANGIOGRAM;  Surgeon: Sinclair Grooms, MD;  Location[facility] C[facility] LAB;  Service: Cardiovascular;  Laterality: N/A;  . LEFT HEART CATHETERIZATION WITH CORONARY/GRAFT ANGIOGRAM N/A 07/28/2013   Procedure: LEFT HEART CATHETERIZATION WITH Beatrix Fetters;  Surgeon: Leonie Man, MD;  Location[facility] CATH LAB;  Service: Cardiovascular;  Laterality: N/A;  . PARTIAL NEPHRECTOMY  08/24/02   RIGHT...DR. Risa Grill  . PERCUTANEOUS STENT INTERVENTION N/A 08/10/2013   Procedure: PERCUTANEOUS STENT INTERVENTION;  Surgeon: Sinclair Grooms, MD;  Location: [facility] CATH LAB;  Service: Cardiovascular;  Laterality: N/A;  . TOE SURGERY Right    "shaved; wired back straight"  . TONSILLECTOMY       Medications: Current Meds  Medication Sig  . ALPRAZolam (XANAX) 1 MG tablet Take 1 tablet (1 mg total) by mouth 4 (four) times daily.  Marland Kitchen amLODipine (NORVASC) 5 MG tablet Take 1 tablet (5 mg total) by mouth daily.  . brexpiprazole (REXULTI) 1 MG TABS tablet Take 1 tablet (1 mg total) by mouth daily.  . carisoprodol (SOMA) 350 MG tablet Take  175 mg by mouth 2 (two) times daily.  . clindamycin (CLEOCIN) 300 MG capsule TAKE 2 CAPSULES 1 HOUR PRIOR TO DENTAL APPOINTMENT.  . Diclofenac-miSOPROStol 50-0.2 MG TBEC Take 1 tablet by mouth 2 (two) times daily.  . DULoxetine (CYMBALTA) 60 MG capsule TAKE 2 CAPSULES BY MOUTH EVERY DAY  . furosemide (LASIX) 40 MG tablet Take 40 mg by mouth 2 (two) times daily.  . hyoscyamine (LEVSIN SL) 0.125 MG SL tablet Take 0.25 mg by mouth every 6 (six) hours as needed (IBS).   Marland Kitchen  irbesartan (AVAPRO) 150 MG tablet Take 1 tablet (150 mg total) by mouth daily. Please make overdue appt with Dr. Tamala Julian before anymore refills. Thank you 1st attempt  . loperamide (IMODIUM A-D) 2 MG tablet Take 4 mg by mouth 3 (three) times daily as needed for diarrhea or loose stools.  . Naphazoline HCl (CLEAR EYES OP) Place 1 drop into both eyes 3 (three) times daily.  . nitroGLYCERIN (NITROSTAT) 0.4 MG SL tablet Place 1 tablet (0.4 mg total) under the tongue every 5 (five) minutes as needed for chest pain (MAX 3 TABLETS).  Marland Kitchen oxyCODONE (OXYCONTIN) 20 mg 12 hr tablet Take 20 mg by mouth every 12 (twelve) hours.  Marland Kitchen oxyCODONE-acetaminophen (PERCOCET) 10-325 MG tablet Take 1 tablet by mouth every 8 (eight) hours.  . sertraline (ZOLOFT) 100 MG tablet Take 2 tablets (200 mg total) by mouth daily.  Marland Kitchen testosterone cypionate (DEPOTESTOSTERONE CYPIONATE) 200 MG/ML injection Inject 200 mg once every 14 days  . zolpidem (AMBIEN) 10 MG tablet Take 1 tablet (10 mg total) by mouth at bedtime.     Allergies: Allergies  Allergen Reactions  . Aspirin Other (See Comments)    H/O BLEEDING ULCER  . Nsaids Other (See Comments)    Stomach bleeding  . Tolmetin Other (See Comments)    Stomach bleeding  . Zoloft [Sertraline Hcl] Other (See Comments)    FELT TERRIBLE  . Klonopin [Clonazepam] Other (See Comments)    Erectile dysfunction  . Beta Adrenergic Blockers Nausea Only    Metoprolol  . Penicillins Nausea And Vomiting and Other (See  Comments)    Has patient had a PCN reaction causing immediate rash, facial/tongue/throat swelling, SOB or lightheadedness with hypotension: No Has patient had a PCN reaction causing severe rash involving mucus membranes or skin necrosis: No Has patient had a PCN reaction that required hospitalization: No Has patient had a PCN reaction occurring within the last 10 years: No If all of the above answers are "NO", then may proceed with Cephalosporin use.   . Reglan [Metoclopramide] Anxiety    EXCITABILITY     Social History: The patient  reports that he quit smoking about 44 years ago. His smoking use included cigarettes. He started smoking about 50 years ago. He has a 9.00 pack-year smoking history. His smokeless tobacco use includes snuff. He reports current alcohol use. He reports that he does not use drugs.   Family History: The patient's family history includes Cancer in his mother; Heart disease in his father.   Review of Systems: Please see the history of present illness.   All other systems are reviewed and negative.   Physical Exam: VS:  BP (!) 160/100 (BP Location: Left Arm, Patient Position: Sitting, Cuff Size: Normal)   Pulse 81   Ht 5\' 6"  (1.676 m)   Wt 162 lb 0.6 oz (73.5 kg)   BMI 26.15 kg/m  .  BMI Body mass index is 26.15 kg/m.  Wt Readings from Last 3 Encounters:  03/20/20 162 lb 0.6 oz (73.5 kg)  02/08/19 168 lb 14.4 oz (76.6 kg)  07/26/18 174 lb (78.9 kg)   BP is 140/110 by me.   General: Alert and in no acute distress.   Cardiac: Regular rate and rhythm. Harsh outflow murmur.  No significant edema that I see.  Respiratory:  Lungs are clear to auscultation bilaterally with normal work of breathing.  GI: Soft and nontender.  MS: No deformity or atrophy. Gait and ROM intact.  Skin: Warm and dry. Color  is normal.  Neuro:  Strength and sensation are intact and no gross focal deficits noted.  Psych: Alert, appropriate and with normal affect. Seems angry - upset.     LABORATORY DATA:  EKG:  EKG is ordered today.  Personally reviewed by me. This demonstrates sinus with LBBB and 1st degree AV block - with LAD - HR is 81- tracing is unchanged.  Lab Results  Component Value Date   WBC 7.3 09/01/2017   HGB 16.4 09/01/2017   HCT 46.4 09/01/2017   PLT 129 (L) 09/01/2017   GLUCOSE 78 09/01/2017   ALT 23 03/08/2014   AST 24 03/08/2014   NA 140 09/01/2017   K 4.0 09/01/2017   CL 105 09/01/2017   CREATININE 1.03 09/01/2017   BUN 27 (H) 09/01/2017   CO2 26 09/01/2017   INR 1.0 07/27/2013   HGBA1C 5.0 02/10/2013       BNP (last 3 results) No results for input(s): BNP in the last 8760 hours.  ProBNP (last 3 results) No results for input(s): PROBNP in the last 8760 hours.   Other Studies Reviewed Today:  ECHO IMPRESSIONS 07/2019  1. Left ventricular ejection fraction, by estimation, is 55%. The left  ventricle has normal function. The left ventricle has no regional wall  motion abnormalities. There is mild left ventricular hypertrophy. Left  ventricular diastolic parameters are  consistent with Grade I diastolic dysfunction (impaired relaxation).  2. Right ventricular systolic function is mildly reduced. The right  ventricular size is mildly enlarged. There is normal pulmonary artery  systolic pressure. The estimated right ventricular systolic pressure is  39.7 mmHg.  3. The mitral valve is normal in structure. Trivial mitral valve  regurgitation. No evidence of mitral stenosis.  4. Status post bioprosthetic aortic valve. Mean gradient 21 mmHg, AVA  1.06 cm^2. 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 No  regurgitation.  5. Aortic dilatation noted. There is mild dilatation of the ascending  aorta measuring 41 mm.  6. The inferior vena cava is normal in size with greater than 50%  respiratory variability, suggesting right atrial pressure of 3 mmHg.     ASSESSMENT & PLAN:     1. CAD with remote CABG - failed SVG to  OM - subsequently able to have PCI with DES to the OM - he endorses chest pain - unclear if this is cardiac related - he is not interested in any testing or other medical therapy - he feels the reduction of his testosterone is the culprit and feels this is very politically/legally  motivated and that the patient is just doomed to suffer.   2. 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   3. HTN -  Not controlled - he is not interested in further medicines. Has dilated aorta as well - explained the necessity of better BP control - he was not interested.   4. HLD - he says he has had labs done.   5. Chronic pain - seems to be the crux of most of his issues in my opinion.   6. Depression  7. Renal carcinoma - follows with [facility] - unclear status.    Current medicines are reviewed with the patient today.  The patient does not have concerns regarding medicines other than what has been noted above.  The following changes have been made:  See above.  Labs/ tests ordered today include:    Orders Placed This Encounter  Procedures  . EKG  12-Lead     Disposition:   FU with Dr. Tamala Julian  in 6 months - he really does not wish to have any other medicines added - I do not think I am really able to help him - he agrees with this - I do not think that he is as stable as he was a year ago but he is resistant to further measures from my standpoint.   Patient is agreeable to this plan and will call if any problems develop in the interim.   SignedTruitt Merle, NP  03/20/2020 8:34 AM  [facility] [address] [place], [place]  [address] Phone: [phone] Fax: [phone]

## 2020-03-17 ENCOUNTER — Other Ambulatory Visit: Payer: Self-pay | Admitting: Adult Health

## 2020-03-20 ENCOUNTER — Encounter: Payer: Self-pay | Admitting: Nurse Practitioner

## 2020-03-20 ENCOUNTER — Other Ambulatory Visit: Payer: Self-pay

## 2020-03-20 ENCOUNTER — Ambulatory Visit: Payer: Federal, State, Local not specified - PPO | Admitting: Nurse Practitioner

## 2020-03-20 VITALS — BP 160/100 | HR 81 | Ht 66.0 in | Wt 162.0 lb

## 2020-03-20 DIAGNOSIS — I10 Essential (primary) hypertension: Secondary | ICD-10-CM

## 2020-03-20 DIAGNOSIS — I5032 Chronic diastolic (congestive) heart failure: Secondary | ICD-10-CM

## 2020-03-20 DIAGNOSIS — E782 Mixed hyperlipidemia: Secondary | ICD-10-CM

## 2020-03-20 DIAGNOSIS — Z952 Presence of prosthetic heart valve: Secondary | ICD-10-CM

## 2020-03-20 DIAGNOSIS — G8929 Other chronic pain: Secondary | ICD-10-CM

## 2020-03-20 NOTE — Patient Instructions (Addendum)
After Visit Summary:  We will be checking the following labs today - NONE   Medication Instructions:    Continue with your current medicines.    If you need a refill on your cardiac medications before your next appointment, please call your pharmacy.     Testing/Procedures To Be Arranged:  N/A  Follow-Up:   See Dr. Smith in 6 months    At [facility], you and your health needs are our priority.  As part of our continuing mission to provide you with exceptional heart care, we have created designated Provider Care Teams.  These Care Teams include your primary Cardiologist (physician) and Advanced Practice Providers (APPs -  Physician Assistants and Nurse Practitioners) who all work together to provide you with the care you need, when you need it.  Special Instructions:  . Stay safe, wash your hands for at least 20 seconds and wear a mask when needed.  . It was good to talk with you today.    Call the [facility] office at [phone] if you have any questions, problems or concerns.       

## 2020-03-27 ENCOUNTER — Ambulatory Visit (INDEPENDENT_AMBULATORY_CARE_PROVIDER_SITE_OTHER): Payer: Federal, State, Local not specified - PPO | Admitting: Adult Health

## 2020-03-27 ENCOUNTER — Other Ambulatory Visit: Payer: Self-pay

## 2020-03-27 ENCOUNTER — Encounter: Payer: Self-pay | Admitting: Adult Health

## 2020-03-27 DIAGNOSIS — G894 Chronic pain syndrome: Secondary | ICD-10-CM

## 2020-03-27 DIAGNOSIS — F410 Panic disorder [episodic paroxysmal anxiety] without agoraphobia: Secondary | ICD-10-CM

## 2020-03-27 DIAGNOSIS — F411 Generalized anxiety disorder: Secondary | ICD-10-CM | POA: Diagnosis not present

## 2020-03-27 DIAGNOSIS — F331 Major depressive disorder, recurrent, moderate: Secondary | ICD-10-CM

## 2020-03-27 DIAGNOSIS — G4700 Insomnia, unspecified: Secondary | ICD-10-CM | POA: Diagnosis not present

## 2020-03-27 MED ORDER — ALPRAZOLAM 1 MG PO TABS: 1.0000 mg | ORAL_TABLET | Freq: Four times a day (QID) | ORAL | 2 refills | Status: DC

## 2020-03-27 MED ORDER — ZOLPIDEM TARTRATE 10 MG PO TABS: 10.0000 mg | ORAL_TABLET | Freq: Every day | ORAL | 2 refills | Status: DC

## 2020-03-27 NOTE — Progress Notes (Signed)
Max Byrd [id number] [birth year] 65 y.o.  Subjective:   Patient ID:  Max Byrd is a 65 y.o. (DOB April 30, 1955) male.  Chief Complaint: No chief complaint on file.   HPI   Max Byrd presents to the office today for follow-up of chronic pain, depression, anxiety, panic attacks and insomnia. Pleasant. Describes mood today as "about the same". Reports depression, anxiety, and irritability. Stating "I'm frustated". Unable to start [place] due to insurance cost. Started on Testosterone - receives injection every 3 weeks - felt like it was more helpful when receiving bi-weekly. Chronic pain issues - followed by pain management - recent reduction in pain medication. Followed by cardiology for heart failure. Varying interest and motivation. Taking medications as prescribed. Energy levels decreased. Active, going to the gym once to twice a week.  Enjoys some usual interests. 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 Appetite adequate. Weight loss - 161 pounds. Typical 178 range. Reduced after decrease in testosterone. Sleeps better some nights than others - recently getting 4 to 5 hours. Some daytime napping. Focus and concentration difficulties. Stating "I have severe memory issues". Completing tasks. Managing some aspects of household - laundry. Disabled.  Denies SI or HI.  Denies AH or VH.  [facility] Admission (Discharged) from 09/06/2017 in [facility]       Review of Systems:  Review of Systems  Musculoskeletal: Negative for gait problem.  Neurological: Negative for tremors.  Psychiatric/Behavioral:       Please refer to HPI    Medications: I have reviewed the patient's current medications.  Current Outpatient Medications  Medication Sig Dispense Refill  . ALPRAZolam (XANAX) 1 MG tablet Take 1 tablet (1 mg total) by mouth 4 (four) times daily. 120 tablet 2  . amLODipine (NORVASC) 5 MG tablet Take 1 tablet (5 mg  total) by mouth daily. 90 tablet 3  . brexpiprazole (REXULTI) 1 MG TABS tablet Take 1 tablet (1 mg total) by mouth daily. 30 tablet 2  . carisoprodol (SOMA) 350 MG tablet Take 175 mg by mouth 2 (two) times daily.    . clindamycin (CLEOCIN) 300 MG capsule TAKE 2 CAPSULES 1 HOUR PRIOR TO DENTAL APPOINTMENT.    . Diclofenac-miSOPROStol 50-0.2 MG TBEC Take 1 tablet by mouth 2 (two) times daily.    . DULoxetine (CYMBALTA) 60 MG capsule TAKE 2 CAPSULES BY MOUTH EVERY DAY 60 capsule 5  . furosemide (LASIX) 40 MG tablet Take 40 mg by mouth 2 (two) times daily.    . hyoscyamine (LEVSIN SL) 0.125 MG SL tablet Take 0.25 mg by mouth every 6 (six) hours as needed (IBS).     Marland Kitchen irbesartan (AVAPRO) 150 MG tablet Take 1 tablet (150 mg total) by mouth daily. Please make overdue appt with Dr. Tamala Julian before anymore refills. Thank you 1st attempt 15 tablet 0  . loperamide (IMODIUM A-D) 2 MG tablet Take 4 mg by mouth 3 (three) times daily as needed for diarrhea or loose stools.    . Naphazoline HCl (CLEAR EYES OP) Place 1 drop into both eyes 3 (three) times daily.    . nitroGLYCERIN (NITROSTAT) 0.4 MG SL tablet Place 1 tablet (0.4 mg total) under the tongue every 5 (five) minutes as needed for chest pain (MAX 3 TABLETS). 25 tablet 3  . oxyCODONE (OXYCONTIN) 20 mg 12 hr tablet Take 20 mg by mouth every 12 (twelve) hours.    Marland Kitchen oxyCODONE-acetaminophen (PERCOCET) 10-325 MG  tablet Take 1 tablet by mouth every 8 (eight) hours.    . sertraline (ZOLOFT) 100 MG tablet Take 2 tablets (200 mg total) by mouth daily. 60 tablet 5  . testosterone cypionate (DEPOTESTOSTERONE CYPIONATE) 200 MG/ML injection Inject 200 mg once every 14 days  3  . zolpidem (AMBIEN) 10 MG tablet Take 1 tablet (10 mg total) by mouth at bedtime. 30 tablet 2   No current facility-administered medications for this visit.    Medication Side Effects: None  Allergies:  Allergies  Allergen Reactions  . Aspirin Other (See Comments)    H/O BLEEDING ULCER  .  Nsaids Other (See Comments)    Stomach bleeding  . Tolmetin Other (See Comments)    Stomach bleeding  . Zoloft [Sertraline Hcl] Other (See Comments)    FELT TERRIBLE  . Klonopin [Clonazepam] Other (See Comments)    Erectile dysfunction  . Beta Adrenergic Blockers Nausea Only    Metoprolol  . Penicillins Nausea And Vomiting and Other (See Comments)    Has patient had a PCN reaction causing immediate rash, facial/tongue/throat swelling, SOB or lightheadedness with hypotension: No Has patient had a PCN reaction causing severe rash involving mucus membranes or skin necrosis: No Has patient had a PCN reaction that required hospitalization: No Has patient had a PCN reaction occurring within the last 10 years: No If all of the above answers are "NO", then may proceed with Cephalosporin use.   . Reglan [Metoclopramide] Anxiety    EXCITABILITY     Past Medical History:  Diagnosis Date  . Anginal pain ([place]) 2015   "since valve was done"  . Anxiety   . Arthritis    "left pointer finger; forminal openings bilaterally" (08/10/2013)  . Bicuspid aortic valve    a. 01/2013 s/p AVR - 66mm Edwards 3300 TFX pericardial tissue valve, ser # [id number].  Marland Kitchen CKD (chronic kidney disease) stage 2, GFR 60-89 ml/min   . Coronary atherosclerosis of native coronary artery    a. 01/2013 CABGx3: LIMA->LAD, VG->Diag, VG->OM (performed @ time of AVR),  occluded SVG-OM2 , now with 95% stenosis at the anastomosis site on OM 2 - PCI attempt 07/28/13 unsuccessful - med Rx cont'd   . Depression   . Erectile dysfunction    INJECTIONS OF PROSTAGLANDIN BY UROLGIST  . Essential hypertension, benign   . GERD (gastroesophageal reflux disease)   . H/O Bell's palsy 2010 X2  . Heart murmur   . History of blood transfusion    "related to stomach bleeding from ASA & NSAIDS  . History of stomach ulcers   . Hyperlipidemia   . IBS (irritable bowel syndrome)   . Migraine    "went away in the 1990's"  . Pain, chronic  postoperative    S/P LEFT ARM SURGERY  . Renal cell carcinoma of right kidney ([facility]) 2003    Family History  Problem Relation Age of Onset  . Cancer Mother        BREAST/OVARIAN  . Heart disease Father        S/P MI/CABG    Social History   Socioeconomic History  . Marital status: Married    Spouse name: Not on file  . Number of children: Not on file  . Years of education: Not on file  . Highest education level: Not on file  Occupational History  . Not on file  Tobacco Use  . Smoking status: Former Smoker    Packs/day: 1.50    Years: 6.00  Pack years: 9.00    Types: Cigarettes    Start date: 10/06/1969    Quit date: 01/21/1976    Years since quitting: 44.2  . Smokeless tobacco: Current User    Types: Snuff  . Tobacco comment: quit  36 years ago  Vaping Use  . Vaping Use: Never used  Substance and Sexual Activity  . Alcohol use: Yes    Alcohol/week: 0.0 standard drinks    Comment: "last beer was 01/2013"  . Drug use: No  . Sexual activity: Yes  Other Topics Concern  . Not on file  Social History Narrative   Was working out regularly prior to CABG/AVR.  Hunts/fishes.   Social Determinants of Health   Financial Resource Strain: Not on file  Food Insecurity: Not on file  Transportation Needs: Not on file  Physical Activity: Not on file  Stress: Not on file  Social Connections: Not on file  Intimate Partner Violence: Not on file    Past Medical History, Surgical history, Social history, and Family history were reviewed and updated as appropriate.   Please see review of systems for further details on the patient's review from today.   Objective:   Physical Exam:  There were no vitals taken for this visit.  Physical Exam Constitutional:      General: He is not in acute distress. Musculoskeletal:        General: No deformity.  Neurological:     Mental Status: He is alert and oriented to person, place, and time.     Coordination: Coordination normal.   Psychiatric:        Attention and Perception: Attention and perception normal. He does not perceive auditory or visual hallucinations.        Mood and Affect: Mood normal. Mood is not anxious or depressed. Affect is not labile, blunt, angry or inappropriate.        Speech: Speech normal.        Behavior: Behavior normal.        Thought Content: Thought content normal. Thought content is not paranoid or delusional. Thought content does not include homicidal or suicidal ideation. Thought content does not include homicidal or suicidal plan.        Cognition and Memory: Cognition and memory normal.        Judgment: Judgment normal.     Comments: Insight intact     Lab Review:     Component Value Date/Time   NA 140 09/01/2017 1018   NA 139 03/08/2014 1230   K 4.0 09/01/2017 1018   K 4.2 03/08/2014 1230   CL 105 09/01/2017 1018   CL 101 03/08/2014 1230   CO2 26 09/01/2017 1018   CO2 30 03/08/2014 1230   GLUCOSE 78 09/01/2017 1018   GLUCOSE 87 03/08/2014 1230   BUN 27 (H) 09/01/2017 1018   BUN 27 (H) 03/08/2014 1230   CREATININE 1.03 09/01/2017 1018   CREATININE 1.4 (H) 03/08/2014 1230   CALCIUM 8.9 09/01/2017 1018   CALCIUM 8.8 03/08/2014 1230   PROT 5.9 (L) 03/08/2014 1230   ALBUMIN 3.6 03/08/2014 1230   AST 24 03/08/2014 1230   ALT 23 03/08/2014 1230   ALKPHOS 51 03/08/2014 1230   BILITOT 1.00 03/08/2014 1230   GFRNONAA >60 09/01/2017 1018   GFRAA >60 09/01/2017 1018       Component Value Date/Time   WBC 7.3 09/01/2017 1018   RBC 4.87 09/01/2017 1018   HGB 16.4 09/01/2017 1018   HGB 13.7 07/03/2015  0818   HCT 46.4 09/01/2017 1018   HCT 38.3 (L) 07/03/2015 0818   PLT 129 (L) 09/01/2017 1018   PLT 146 07/03/2015 0818   MCV 95.3 09/01/2017 1018   MCV 88 07/03/2015 0818   MCH 33.7 09/01/2017 1018   MCHC 35.3 09/01/2017 1018   RDW 11.8 09/01/2017 1018   RDW 11.9 07/03/2015 0818   LYMPHSABS 1.0 09/01/2017 1018   LYMPHSABS 1.0 07/03/2015 0818   MONOABS 0.4 09/01/2017  1018   EOSABS 0.2 09/01/2017 1018   EOSABS 0.1 07/03/2015 0818   BASOSABS 0.0 09/01/2017 1018   BASOSABS 0.0 07/03/2015 0818    No results found for: POCLITH, LITHIUM   No results found for: PHENYTOIN, PHENOBARB, VALPROATE, CBMZ   .res Assessment: Plan:    Plan:  PDMP reviewed  1. Ambien 10mg  at hs 2. Xanax 1mg  4 x daily 3. Zoloft 200mg  daily 4. Cymbalta 60mg  BID  RTC 6 months  Patient advised to contact office with any questions, adverse effects, or acute worsening in signs and symptoms.  Discussed potential benefits, risk, and side effects of benzodiazepines to include potential risk of tolerance and dependence, as well as possible drowsiness. Advised patient not to drive if experiencing drowsiness and to take lowest possible effective dose to minimize risk of dependence and tolerance.   Diagnoses and all orders for this visit:  Major depressive disorder, recurrent episode, moderate (HCC)  Insomnia, unspecified type -     zolpidem (AMBIEN) 10 MG tablet; Take 1 tablet (10 mg total) by mouth at bedtime.  Generalized anxiety disorder -     ALPRAZolam (XANAX) 1 MG tablet; Take 1 tablet (1 mg total) by mouth 4 (four) times daily.  Panic attacks  Chronic pain syndrome     Please see After Visit Summary for patient specific instructions.  No future appointments.  No orders of the defined types were placed in this encounter.   -------------------------------

## 2020-04-01 ENCOUNTER — Other Ambulatory Visit: Payer: Self-pay | Admitting: Interventional Cardiology

## 2020-05-28 ENCOUNTER — Other Ambulatory Visit: Payer: Self-pay | Admitting: Adult Health

## 2020-05-28 DIAGNOSIS — F411 Generalized anxiety disorder: Secondary | ICD-10-CM

## 2020-05-30 ENCOUNTER — Other Ambulatory Visit: Payer: Self-pay | Admitting: Adult Health

## 2020-05-30 DIAGNOSIS — G4700 Insomnia, unspecified: Secondary | ICD-10-CM

## 2020-07-26 ENCOUNTER — Telehealth: Payer: Self-pay | Admitting: Adult Health

## 2020-07-26 NOTE — Telephone Encounter (Signed)
Next visit is 08/01/20. Max Byrd called and would like to know if he can get a RX written for Lexapro or have his Zoloft increased to 1 pill a day? His phone number is [phone].He is coming in next week and not sure if he will have to be seen before this can be done?

## 2020-07-26 NOTE — Telephone Encounter (Signed)
Please review

## 2020-08-01 ENCOUNTER — Ambulatory Visit (INDEPENDENT_AMBULATORY_CARE_PROVIDER_SITE_OTHER): Payer: Federal, State, Local not specified - PPO | Admitting: Adult Health

## 2020-08-01 ENCOUNTER — Encounter: Payer: Self-pay | Admitting: Adult Health

## 2020-08-01 ENCOUNTER — Other Ambulatory Visit: Payer: Self-pay

## 2020-08-01 DIAGNOSIS — F331 Major depressive disorder, recurrent, moderate: Secondary | ICD-10-CM

## 2020-08-01 DIAGNOSIS — F411 Generalized anxiety disorder: Secondary | ICD-10-CM

## 2020-08-01 DIAGNOSIS — F410 Panic disorder [episodic paroxysmal anxiety] without agoraphobia: Secondary | ICD-10-CM | POA: Diagnosis not present

## 2020-08-01 DIAGNOSIS — G4700 Insomnia, unspecified: Secondary | ICD-10-CM | POA: Diagnosis not present

## 2020-08-01 MED ORDER — DULOXETINE HCL 60 MG PO CPEP: 120.0000 mg | ORAL_CAPSULE | Freq: Every day | ORAL | 5 refills | Status: DC

## 2020-08-01 MED ORDER — ALPRAZOLAM 1 MG PO TABS: 1.0000 mg | ORAL_TABLET | Freq: Four times a day (QID) | ORAL | 2 refills | Status: DC

## 2020-08-01 MED ORDER — ZOLPIDEM TARTRATE 10 MG PO TABS: 10.0000 mg | ORAL_TABLET | Freq: Every day | ORAL | 2 refills | Status: DC

## 2020-08-01 MED ORDER — SERTRALINE HCL 100 MG PO TABS: 200.0000 mg | ORAL_TABLET | Freq: Every day | ORAL | 5 refills | Status: DC

## 2020-08-01 NOTE — Progress Notes (Signed)
Max Byrd [id number] Dec 17, 1955 65 y.o.  Subjective:   Patient ID:  Max Byrd is a 65 y.o. (DOB 1955-06-04) male.  Chief Complaint: No chief complaint on file.   HPI Max Byrd presents to the office today for follow-up of GAD, MDD, insomnia, and panic attacks.   Describes mood today as "not to good". Pleasant. Tearful at times. Reports depression, anxiety, and irritability. Stating "my health is declining". Diagnosed with multiple medical problems. Chronic pain issues - "I hurt all the time". Followed by cardiology for heart failure. Has met with hospice to discuss end of life options. Varying interest and motivation. Taking medications as prescribed. Energy levels decreased. Active, going to the gym once to twice a week - 45 minutes. Enjoys some usual interests. 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 Weight loss - 156 to 158 pounds.  Sleeps well most nights with Ambien. Averages 8 hours. Sleeping from 1 in the afternoon until 9 at night. Feels like the loss of testosterone has effected his sleep significantly. Focus and concentration difficulties - "my memory is not good". Completing tasks. Managing some aspects of household - laundry. Disabled.  Denies SI or HI.  Denies AH or VH.  Flowsheet Row Admission (Discharged) from 09/06/2017 in [facility]  [facility]  C-SSRS RISK CATEGORY Low Risk        Review of Systems:  Review of Systems  Musculoskeletal:  Negative for gait problem.  Neurological:  Negative for tremors.  Psychiatric/Behavioral:         Please refer to HPI   Medications: I have reviewed the patient's current medications.  Current Outpatient Medications  Medication Sig Dispense Refill   ALPRAZolam (XANAX) 1 MG tablet Take 1 tablet (1 mg total) by mouth 4 (four) times daily. 120 tablet 2   amLODipine (NORVASC) 5 MG tablet Take 1 tablet (5 mg total) by mouth daily. 90 tablet 3   brexpiprazole (REXULTI) 1 MG TABS tablet  Take 1 tablet (1 mg total) by mouth daily. 30 tablet 2   carisoprodol (SOMA) 350 MG tablet Take 175 mg by mouth 2 (two) times daily.     clindamycin (CLEOCIN) 300 MG capsule TAKE 2 CAPSULES 1 HOUR PRIOR TO DENTAL APPOINTMENT.     Diclofenac-miSOPROStol 50-0.2 MG TBEC Take 1 tablet by mouth 2 (two) times daily.     DULoxetine (CYMBALTA) 60 MG capsule Take 2 capsules (120 mg total) by mouth daily. 60 capsule 5   furosemide (LASIX) 40 MG tablet Take 40 mg by mouth 2 (two) times daily.     hyoscyamine (LEVSIN SL) 0.125 MG SL tablet Take 0.25 mg by mouth every 6 (six) hours as needed (IBS).      irbesartan (AVAPRO) 150 MG tablet Take 1 tablet (150 mg total) by mouth daily. 90 tablet 3   loperamide (IMODIUM A-D) 2 MG tablet Take 4 mg by mouth 3 (three) times daily as needed for diarrhea or loose stools.     Naphazoline HCl (CLEAR EYES OP) Place 1 drop into both eyes 3 (three) times daily.     nitroGLYCERIN (NITROSTAT) 0.4 MG SL tablet Place 1 tablet (0.4 mg total) under the tongue every 5 (five) minutes as needed for chest pain (MAX 3 TABLETS). 25 tablet 3   oxyCODONE (OXYCONTIN) 20 mg 12 hr tablet Take 20 mg by mouth every 12 (twelve) hours.     oxyCODONE-acetaminophen (PERCOCET) 10-325 MG tablet Take 1 tablet by mouth every 8 (eight) hours.  sertraline (ZOLOFT) 100 MG tablet Take 2 tablets (200 mg total) by mouth daily. 60 tablet 5   testosterone cypionate (DEPOTESTOSTERONE CYPIONATE) 200 MG/ML injection Inject 200 mg once every 14 days  3   zolpidem (AMBIEN) 10 MG tablet Take 1 tablet (10 mg total) by mouth at bedtime. 30 tablet 2   No current facility-administered medications for this visit.    Medication Side Effects: None  Allergies:  Allergies  Allergen Reactions   Aspirin Other (See Comments)    H/O BLEEDING ULCER   Nsaids Other (See Comments)    Stomach bleeding   Tolmetin Other (See Comments)    Stomach bleeding   Zoloft [Sertraline Hcl] Other (See Comments)    FELT TERRIBLE    Klonopin [Clonazepam] Other (See Comments)    Erectile dysfunction   Beta Adrenergic Blockers Nausea Only    Metoprolol   Penicillins Nausea And Vomiting and Other (See Comments)    Has patient had a PCN reaction causing immediate rash, facial/tongue/throat swelling, SOB or lightheadedness with hypotension: No Has patient had a PCN reaction causing severe rash involving mucus membranes or skin necrosis: No Has patient had a PCN reaction that required hospitalization: No Has patient had a PCN reaction occurring within the last 10 years: No If all of the above answers are "NO", then may proceed with Cephalosporin use.    Reglan [Metoclopramide] Anxiety    EXCITABILITY     Past Medical History:  Diagnosis Date   Anginal pain ([place]) 2015   "since valve was done"   Anxiety    Arthritis    "left pointer finger; forminal openings bilaterally" (08/10/2013)   Bicuspid aortic valve    a. 01/2013 s/p AVR - 81mm Edwards 3300 TFX pericardial tissue valve, ser # [id number].   CKD (chronic kidney disease) stage 2, GFR 60-89 ml/min    Coronary atherosclerosis of native coronary artery    a. 01/2013 CABGx3: LIMA->LAD, VG->Diag, VG->OM (performed @ time of AVR),  occluded SVG-OM2 , now with 95% stenosis at the anastomosis site on OM 2 - PCI attempt 07/28/13 unsuccessful - med Rx cont'd    Depression    Erectile dysfunction    INJECTIONS OF PROSTAGLANDIN BY UROLGIST   Essential hypertension, benign    GERD (gastroesophageal reflux disease)    H/O Bell's palsy 2010 X2   Heart murmur    History of blood transfusion    "related to stomach bleeding from ASA & NSAIDS   History of stomach ulcers    Hyperlipidemia    IBS (irritable bowel syndrome)    Migraine    "went away in the 1990's"   Pain, chronic postoperative    S/P LEFT ARM SURGERY   Renal cell carcinoma of right kidney ([place]) 2003    Past Medical History, Surgical history, Social history, and Family history were reviewed and updated as  appropriate.   Please see review of systems for further details on the patient's review from today.   Objective:   Physical Exam:  There were no vitals taken for this visit.  Physical Exam Constitutional:      General: He is not in acute distress. Musculoskeletal:        General: No deformity.  Neurological:     Mental Status: He is alert and oriented to person, place, and time.     Coordination: Coordination normal.  Psychiatric:        Attention and Perception: Attention and perception normal. He does not perceive auditory or visual hallucinations.  Mood and Affect: Mood normal. Mood is not anxious or depressed. Affect is not labile, blunt, angry or inappropriate.        Speech: Speech normal.        Behavior: Behavior normal.        Thought Content: Thought content normal. Thought content is not paranoid or delusional. Thought content does not include homicidal or suicidal ideation. Thought content does not include homicidal or suicidal plan.        Cognition and Memory: Cognition and memory normal.        Judgment: Judgment normal.     Comments: Insight intact    Lab Review:     Component Value Date/Time   NA 140 09/01/2017 1018   NA 139 03/08/2014 1230   K 4.0 09/01/2017 1018   K 4.2 03/08/2014 1230   CL 105 09/01/2017 1018   CL 101 03/08/2014 1230   CO2 26 09/01/2017 1018   CO2 30 03/08/2014 1230   GLUCOSE 78 09/01/2017 1018   GLUCOSE 87 03/08/2014 1230   BUN 27 (H) 09/01/2017 1018   BUN 27 (H) 03/08/2014 1230   CREATININE 1.03 09/01/2017 1018   CREATININE 1.4 (H) 03/08/2014 1230   CALCIUM 8.9 09/01/2017 1018   CALCIUM 8.8 03/08/2014 1230   PROT 5.9 (L) 03/08/2014 1230   ALBUMIN 3.6 03/08/2014 1230   AST 24 03/08/2014 1230   ALT 23 03/08/2014 1230   ALKPHOS 51 03/08/2014 1230   BILITOT 1.00 03/08/2014 1230   GFRNONAA >60 09/01/2017 1018   GFRAA >60 09/01/2017 1018       Component Value Date/Time   WBC 7.3 09/01/2017 1018   RBC 4.87 09/01/2017  1018   HGB 16.4 09/01/2017 1018   HGB 13.7 07/03/2015 0818   HCT 46.4 09/01/2017 1018   HCT 38.3 (L) 07/03/2015 0818   PLT 129 (L) 09/01/2017 1018   PLT 146 07/03/2015 0818   MCV 95.3 09/01/2017 1018   MCV 88 07/03/2015 0818   MCH 33.7 09/01/2017 1018   MCHC 35.3 09/01/2017 1018   RDW 11.8 09/01/2017 1018   RDW 11.9 07/03/2015 0818   LYMPHSABS 1.0 09/01/2017 1018   LYMPHSABS 1.0 07/03/2015 0818   MONOABS 0.4 09/01/2017 1018   EOSABS 0.2 09/01/2017 1018   EOSABS 0.1 07/03/2015 0818   BASOSABS 0.0 09/01/2017 1018   BASOSABS 0.0 07/03/2015 0818    No results found for: POCLITH, LITHIUM   No results found for: PHENYTOIN, PHENOBARB, VALPROATE, CBMZ   .res Assessment: Plan:     Plan:  PDMP reviewed  1. Ambien 6m at hs 2. Xanax 112m4 x daily 3. Zoloft 10017m daily 4. Cymbalta 107m103mD  Plans to restart Gabapentin 200mg35m  RTC 6 months   Patient advised to contact office with any questions, adverse effects, or acute worsening in signs and symptoms.  Discussed potential benefits, risk, and side effects of benzodiazepines to include potential risk of tolerance and dependence, as well as possible drowsiness. Advised patient not to drive if experiencing drowsiness and to take lowest possible effective dose to minimize risk of dependence and tolerance.    Diagnoses and all orders for this visit:  Panic attacks -     ALPRAZolam (XANAX) 1 MG tablet; Take 1 tablet (1 mg total) by mouth 4 (four) times daily.  Major depressive disorder, recurrent episode, moderate (HCC) -     sertraline (ZOLOFT) 100 MG tablet; Take 2 tablets (200 mg total) by mouth daily. -  DULoxetine (CYMBALTA) 60 MG capsule; Take 2 capsules (120 mg total) by mouth daily.  Generalized anxiety disorder -     sertraline (ZOLOFT) 100 MG tablet; Take 2 tablets (200 mg total) by mouth daily. -     ALPRAZolam (XANAX) 1 MG tablet; Take 1 tablet (1 mg total) by mouth 4 (four) times daily. -     DULoxetine  (CYMBALTA) 60 MG capsule; Take 2 capsules (120 mg total) by mouth daily.  Insomnia, unspecified type -     zolpidem (AMBIEN) 10 MG tablet; Take 1 tablet (10 mg total) by mouth at bedtime.    Please see After Visit Summary for patient specific instructions.  Future Appointments  Date Time Provider [place]  01/30/2021  8:00 AM Enma Maeda, Berdie Ogren, NP [facility] None     No orders of the defined types were placed in this encounter.   -------------------------------

## 2020-08-18 ENCOUNTER — Other Ambulatory Visit: Payer: Self-pay | Admitting: Adult Health

## 2020-08-18 DIAGNOSIS — F331 Major depressive disorder, recurrent, moderate: Secondary | ICD-10-CM

## 2020-08-18 DIAGNOSIS — F411 Generalized anxiety disorder: Secondary | ICD-10-CM

## 2020-10-02 NOTE — Progress Notes (Addendum)
Cardiology Office Note   Date:  10/03/2020   ID:  Max Byrd 1955/10/23, MRN [id number]  PCP:  Wenda Low, MD  Cardiologist:  Dr. Tamala Julian    Chief Complaint  Patient presents with   Coronary Artery Disease      History of Present Illness: Max Byrd is a 65 y.o. male who presents for follow up and increasing DOE. He also continues with chest pain.   He has a history of known CKD, bicuspid aortic valve treated with pericardial tissue valve in 2014, HTN, known CAD with prior CABG 02/14/13 with LIMA to LAD, SVG to OM 2, and SVG to diagonal - subsequent failed initial PCI with occluded SVG to OM - but then able to have DES to OM1 (prolonged and complicated PCI). Other issues with depression, renal cell carcinoma and chronic back pain.   Last visit 03/20/20 with Truitt Merle, NP BP was elevated.  Pt felt he was end of his days. Had chet pain but he was not interested in testing.   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     Has been on testosterone but now stopped. He was seen in [place] by Urology and his testosterone was stopped.  Though more he was tired of fighting for his meds.  He is frustrated and now more wekness. Also elevated BP and increased DOE.  He has chest pain with this as well.  He hears his murmur when he lies on his left side.   Past Medical History:  Diagnosis Date   Anginal pain ([place]) 2015   "since valve was done"   Anxiety    Arthritis    "left pointer finger; forminal openings bilaterally" (08/10/2013)   Bicuspid aortic valve    a. 01/2013 s/p AVR - 67m Edwards 3300 TFX pericardial tissue valve, ser # [id number]   CKD (chronic kidney disease) stage 2, GFR 60-89 ml/min    Coronary atherosclerosis of native coronary artery    a. 01/2013 CABGx3: LIMA->LAD, VG->Diag, VG->OM (performed @ time of AVR),  occluded SVG-OM2 , now with 95% stenosis at the anastomosis site on OM 2 - PCI attempt 07/28/13 unsuccessful - med Rx cont'd    Depression    Erectile  dysfunction    INJECTIONS OF PROSTAGLANDIN BY UROLGIST   Essential hypertension, benign    GERD (gastroesophageal reflux disease)    H/O Bell's palsy 2010 X2   Heart murmur    History of blood transfusion    "related to stomach bleeding from ASA & NSAIDS   History of stomach ulcers    Hyperlipidemia    IBS (irritable bowel syndrome)    Migraine    "went away in the 1990's"   Pain, chronic postoperative    S/P LEFT ARM SURGERY   Renal cell carcinoma of right kidney ([place] 2003    Past Surgical History:  Procedure Laterality Date   ANAL SPHINCTEROTOMY  04/13/03   DR. GRAPEY   AORTIC VALVE REPLACEMENT N/A 02/14/2013   Procedure: AORTIC VALVE REPLACEMENT (AVR);  Surgeon: PIvin Poot MD;  Location: [facility]  Service: Open Heart Surgery;  Laterality: N/A;   BUNIONECTOMY     right  02/2009   CARDIAC CATHETERIZATION  2010; 6/12015; 07/29/2013   CARDIAC VALVE REPLACEMENT     COLONOSCOPY  2008   DIVERTICULOSIS   CORONARY ANGIOPLASTY WITH STENT PLACEMENT  08/10/2013   "1"   CORONARY ARTERY BYPASS GRAFT N/A 02/14/2013   Procedure: CORONARY ARTERY BYPASS GRAFTING (  CABG);  Surgeon: Ivin Poot, MD;  Location: [facility];  Service: Open Heart Surgery;  Laterality: N/A;   ELBOW SURGERY Left X 2   ELBOW SURGERY Right X 2   ELBOW SURGERY     left  2009   EXCISIONAL HEMORRHOIDECTOMY     HERNIA REPAIR Left 1957   HERNIA REPAIR     06/21/17   INTRAOPERATIVE TRANSESOPHAGEAL ECHOCARDIOGRAM N/A 02/14/2013   Procedure: INTRAOPERATIVE TRANSESOPHAGEAL ECHOCARDIOGRAM;  Surgeon: Ivin Poot, MD;  Location: [facility];  Service: Open Heart Surgery;  Laterality: N/A;   LATERAL EPICONDYLE RELEASE Left 07/29/07   DR. GRAVES   LEFT AND RIGHT HEART CATHETERIZATION WITH CORONARY ANGIOGRAM N/A 01/27/2013   Procedure: LEFT AND RIGHT HEART CATHETERIZATION WITH CORONARY ANGIOGRAM;  Surgeon: Sinclair Grooms, MD;  Location: [facility] CATH LAB;  Service: Cardiovascular;  Laterality: N/A;   LEFT HEART CATHETERIZATION WITH  CORONARY/GRAFT ANGIOGRAM N/A 07/28/2013   Procedure: LEFT HEART CATHETERIZATION WITH Beatrix Fetters;  Surgeon: Leonie Man, MD;  Location: [facility] CATH LAB;  Service: Cardiovascular;  Laterality: N/A;   PARTIAL NEPHRECTOMY  08/24/02   RIGHT...DR. Risa Grill   PERCUTANEOUS STENT INTERVENTION N/A 08/10/2013   Procedure: PERCUTANEOUS STENT INTERVENTION;  Surgeon: Sinclair Grooms, MD;  Location: [facility] CATH LAB;  Service: Cardiovascular;  Laterality: N/A;   TOE SURGERY Right    "shaved; wired back straight"   TONSILLECTOMY       Current Outpatient Medications  Medication Sig Dispense Refill   ALPRAZolam (XANAX) 1 MG tablet Take 1 tablet (1 mg total) by mouth 4 (four) times daily. 120 tablet 2   amLODipine (NORVASC) 5 MG tablet Take 1 tablet (5 mg total) by mouth daily. 90 tablet 3   carisoprodol (SOMA) 350 MG tablet Take 175 mg by mouth 2 (two) times daily.     clindamycin (CLEOCIN) 300 MG capsule TAKE 2 CAPSULES 1 HOUR PRIOR TO DENTAL APPOINTMENT.     Diclofenac-miSOPROStol 50-0.2 MG TBEC Take 1 tablet by mouth 2 (two) times daily.     DULoxetine (CYMBALTA) 60 MG capsule Take 2 capsules (120 mg total) by mouth daily. 60 capsule 5   furosemide (LASIX) 40 MG tablet Take 40 mg by mouth 2 (two) times daily.     hyoscyamine (LEVSIN SL) 0.125 MG SL tablet Take 0.25 mg by mouth every 6 (six) hours as needed (IBS).      irbesartan (AVAPRO) 150 MG tablet Take 1 tablet (150 mg total) by mouth daily. 90 tablet 3   loperamide (IMODIUM A-D) 2 MG tablet Take 4 mg by mouth 3 (three) times daily as needed for diarrhea or loose stools.     morphine (MS CONTIN) 15 MG 12 hr tablet Take 15 mg by mouth 2 (two) times daily as needed.     Naphazoline HCl (CLEAR EYES OP) Place 1 drop into both eyes 3 (three) times daily.     nitroGLYCERIN (NITROSTAT) 0.4 MG SL tablet Place 1 tablet (0.4 mg total) under the tongue every 5 (five) minutes as needed for chest pain (MAX 3 TABLETS). 25 tablet 3   oxyCODONE-acetaminophen  (PERCOCET) 10-325 MG tablet Take 1 tablet by mouth every 8 (eight) hours.     sertraline (ZOLOFT) 100 MG tablet Take 2 tablets (200 mg total) by mouth daily. 60 tablet 5   zolpidem (AMBIEN) 10 MG tablet Take 1 tablet (10 mg total) by mouth at bedtime. 30 tablet 2   brexpiprazole (REXULTI) 1 MG TABS tablet Take 1 tablet (1 mg  total) by mouth daily. (Patient not taking: Reported on 10/03/2020) 30 tablet 2   oxyCODONE (OXYCONTIN) 20 mg 12 hr tablet Take 20 mg by mouth every 12 (twelve) hours. (Patient not taking: Reported on 10/03/2020)     testosterone cypionate (DEPOTESTOSTERONE CYPIONATE) 200 MG/ML injection Inject 200 mg once every 14 days (Patient not taking: Reported on 10/03/2020)  3   No current facility-administered medications for this visit.    Allergies:   Aspirin, Nsaids, Tolmetin, Zoloft [sertraline hcl], Klonopin [clonazepam], Beta adrenergic blockers, Penicillins, and Reglan [metoclopramide]    Social History:  The patient  reports that he quit smoking about 44 years ago. His smoking use included cigarettes. He started smoking about 51 years ago. He has a 9.00 pack-year smoking history. His smokeless tobacco use includes snuff. He reports current alcohol use. He reports that he does not use drugs.   Family History:  The patient's family history includes Cancer in his mother; Heart disease in his father.    ROS:  General:no colds or fevers, no weight changes Skin:no rashes or ulcers HEENT:no blurred vision, no congestion CV:see HPI PUL:see HPI GI:no diarrhea constipation or melena, no indigestion GU:no hematuria, no dysuria MS:no joint pain, no claudication Neuro:no syncope, no lightheadedness Endo:no diabetes, + thyroid disease  Wt Readings from Last 3 Encounters:  10/03/20 160 lb 6.4 oz (72.8 kg)  03/20/20 162 lb 0.6 oz (73.5 kg)  02/08/19 168 lb 14.4 oz (76.6 kg)     PHYSICAL EXAM: VS:  BP (!) 160/90   Pulse 72   Ht '5\' 6"'$  (1.676 m)   Wt 160 lb 6.4 oz (72.8 kg)    SpO2 98%   BMI 25.89 kg/m  , BMI Body mass index is 25.89 kg/m. General:Pleasant affect, NAD Skin:Warm and dry, brisk capillary refill HEENT:normocephalic, sclera clear, mucus membranes moist Neck:supple, no JVD, + carotid  bruits  Heart:S1S2 RRR with [id number] systolic murmur,no gallup, rub or click-radiation of murmur into neck and abd.  Lungs:clear without rales, rhonchi, or wheezes [id number], non tender, + BS, do not palpate liver spleen or masses Ext:+ lower ext edema of feet.  1+ pedal pulses, 2+ radial pulses Neuro:alert and oriented X 3, MAE, follows commands, + facial symmetry   EKG:  EKG is not ordered today. The ekg ordered today demonstrates    Recent Labs: No results found for requested labs within last 8760 hours.    Lipid Panel No results found for: CHOL, TRIG, HDL, CHOLHDL, VLDL, LDLCALC, LDLDIRECT     Other studies Reviewed: Additional studies/ records that were reviewed today include: . ECHO IMPRESSIONS 07/2019   1. Left ventricular ejection fraction, by estimation, is 55%. The left  ventricle has normal function. The left ventricle has no regional wall  motion abnormalities. There is mild left ventricular hypertrophy. Left  ventricular diastolic parameters are  consistent with Grade I diastolic dysfunction (impaired relaxation).   2. Right ventricular systolic function is mildly reduced. The right  ventricular size is mildly enlarged. There is normal pulmonary artery  systolic pressure. The estimated right ventricular systolic pressure is  [id number] mmHg.   3. The mitral valve is normal in structure. Trivial mitral valve  regurgitation. No evidence of mitral stenosis.   4. Status post bioprosthetic aortic valve. Mean gradient 21 mmHg, AVA  1.06 cm^2. 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 No  regurgitation.   5. Aortic dilatation noted. There is mild dilatation of the ascending  aorta measuring 41 mm.   6. The inferior vena  cava is normal in size  with greater than 50%  respiratory variability, suggesting right atrial pressure of 3 mmHg.     ASSESSMENT AND PLAN:  1.  Increasing SOB mainly with exertion. Hx of AVR. With mild to mod bioprosthetic  AS. Along with chest pain.  We discussed tests he does not want ischemic testing.  But he agrees and asks for echo.  He feels his murmer is louder and I agree and with radiation into neck and abd.  - last echo 1 year ago with mild to mod stenosis..  Some mild lower ext edema on lasix 40 BID. He tells me he had labs at urology recently - will contact their office. To see if we can obtain. Pericardial tissue valve in 2014 with mild moderate .    2.  CAD with CABG 2014 with LIMA to LAD, VG to OM2, VG to diag. subsequent failed initial PCI with occluded SVG to OM - but then able to have DES to OM1 (prolonged and complicated PCI).  Still with angina but does not want test.  Will see how echo looks.  Then to discuss with Dr. Tamala Julian.    3.  HLD on no statins. Pt not wanting to add  4.  HTN poorly controlled. We discussed increasing amlodipine but he did not wish to do.  Discussed may be cause of dyspnea.  For now to monitor.  He believes it is related to no more testosterone.   5.  Mild dilatation of ascending aorta.  Repeating echo.  6.  Chronic pain followed by pain center   7. Depression, does see a counselor, but with testosterone issues, he tells me he has a good life.  We discussed going to [facility] or [facility] for testosterone he does not want to drive that far though he may go to urology in [facility].       8. Carotid bruits bil, most likely radiation of aortic murmur, will check Echo if no change will check carotids , on last eval in 2019 bil 1-39% stenosis. . Also bruit in abd again most likely AS   Current medicines are reviewed with the patient today.  The patient Has no concerns regarding medicines.  The following changes have been made:  See above Labs/ tests ordered today include:see  above  Disposition:   FU:  see above  Signed, Cecilie Kicks, NP  10/03/2020 9:25 AM    [address], [place], [place], [place] Phone: [phone]; Fax: [phone]

## 2020-10-03 ENCOUNTER — Ambulatory Visit: Payer: Federal, State, Local not specified - PPO | Admitting: Cardiology

## 2020-10-03 ENCOUNTER — Encounter: Payer: Self-pay | Admitting: Cardiology

## 2020-10-03 ENCOUNTER — Other Ambulatory Visit: Payer: Self-pay

## 2020-10-03 VITALS — BP 160/90 | HR 72 | Ht 66.0 in | Wt 160.4 lb

## 2020-10-03 DIAGNOSIS — Z952 Presence of prosthetic heart valve: Secondary | ICD-10-CM | POA: Diagnosis not present

## 2020-10-03 DIAGNOSIS — R0602 Shortness of breath: Secondary | ICD-10-CM | POA: Diagnosis not present

## 2020-10-03 DIAGNOSIS — E782 Mixed hyperlipidemia: Secondary | ICD-10-CM | POA: Diagnosis not present

## 2020-10-03 DIAGNOSIS — I10 Essential (primary) hypertension: Secondary | ICD-10-CM | POA: Diagnosis not present

## 2020-10-03 DIAGNOSIS — F3289 Other specified depressive episodes: Secondary | ICD-10-CM

## 2020-10-03 DIAGNOSIS — I2510 Atherosclerotic heart disease of native coronary artery without angina pectoris: Secondary | ICD-10-CM

## 2020-10-03 DIAGNOSIS — I77810 Thoracic aortic ectasia: Secondary | ICD-10-CM

## 2020-10-03 DIAGNOSIS — G8929 Other chronic pain: Secondary | ICD-10-CM

## 2020-10-03 NOTE — Patient Instructions (Signed)
Medication Instructions:  Your physician recommends that you continue on your current medications as directed. Please refer to the Current Medication list given to you today.  *If you need a refill on your cardiac medications before your next appointment, please call your pharmacy*   Lab Work: None ordered  If you have labs (blood work) drawn today and your tests are completely normal, you will receive your results only by: [place] (if you have MyChart) OR A paper copy in the mail If you have any lab test that is abnormal or we need to change your treatment, we will call you to review the results.   Testing/Procedures: Your physician has requested that you have an echocardiogram. Echocardiography is a painless test that uses sound waves to create images of your heart. It provides your doctor with information about the size and shape of your heart and how well your heart's chambers and valves are working. This procedure takes approximately one hour. There are no restrictions for this procedure.    Follow-Up: At [facility] you and your health needs are our priority.  As part of our continuing mission to provide you with exceptional heart care, we have created designated Provider Care Teams.  These Care Teams include your primary Cardiologist (physician) and Advanced Practice Providers (APPs -  Physician Assistants and Nurse Practitioners) who all work together to provide you with the care you need, when you need it.  We recommend signing up for the patient portal called "MyChart".  Sign up information is provided on this After Visit Summary.  MyChart is used to connect with patients for Virtual Visits (Telemedicine).  Patients are able to view lab/test results, encounter notes, upcoming appointments, etc.  Non-urgent messages can be sent to your provider as well.   To learn more about what you can do with MyChart, go to [email or web address].    Your next appointment:   12  month(s)  The format for your next appointment:   In Person  Provider:   You may see Sinclair Grooms, MD or one of the following Advanced Practice Providers on your designated Care Team:   Cecilie Kicks, NP    Other Instructions Echocardiogram An echocardiogram is a test that uses sound waves (ultrasound) to produce images of the heart. Images from an echocardiogram can provide important information about: Heart size and shape. The size and thickness and movement of your heart's walls. Heart muscle function and strength. Heart valve function or if you have stenosis. Stenosis is when the heart valves are too narrow. If blood is flowing backward through the heart valves (regurgitation). A tumor or infectious growth around the heart valves. Areas of heart muscle that are not working well because of poor blood flow or injury from a heart attack. Aneurysm detection. An aneurysm is a weak or damaged part of an artery wall. The wall bulges out from the normal force of blood pumping through the body. Tell a health care provider about: Any allergies you have. All medicines you are taking, including vitamins, herbs, eye drops, creams, and over-the-counter medicines. Any blood disorders you have. Any surgeries you have had. Any medical conditions you have. Whether you are pregnant or may be pregnant. What are the risks? Generally, this is a safe test. However, problems may occur, including an allergic reaction to dye (contrast) that may be used during the test. What happens before the test? No specific preparation is needed. You may eat and drink normally. What happens during the  test?  You will take off your clothes from the waist up and put on a hospital gown. Electrodes or electrocardiogram (ECG)patches may be placed on your chest. The electrodes or patches are then connected to a device that monitors your heart rate and rhythm. You will lie down on a table for an ultrasound exam. A  gel will be applied to your chest to help sound waves pass through your skin. A handheld device, called a transducer, will be pressed against your chest and moved over your heart. The transducer produces sound waves that travel to your heart and bounce back (or "echo" back) to the transducer. These sound waves will be captured in real-time and changed into images of your heart that can be viewed on a video monitor. The images will be recorded on a computer and reviewed by your health care provider. You may be asked to change positions or hold your breath for a short time. This makes it easier to get different views or better views of your heart. In some cases, you may receive contrast through an IV in one of your veins. This can improve the quality of the pictures from your heart. The procedure may vary among health care providers and hospitals. What can I expect after the test? You may return to your normal, everyday life, including diet, activities, andmedicines, unless your health care provider tells you not to do that. Follow these instructions at home: It is up to you to get the results of your test. Ask your health care provider, or the department that is doing the test, when your results will be ready. Keep all follow-up visits. This is important. Summary An echocardiogram is a test that uses sound waves (ultrasound) to produce images of the heart. Images from an echocardiogram can provide important information about the size and shape of your heart, heart muscle function, heart valve function, and other possible heart problems. You do not need to do anything to prepare before this test. You may eat and drink normally. After the echocardiogram is completed, you may return to your normal, everyday life, unless your health care provider tells you not to do that. This information is not intended to replace advice given to you by your health care provider. Make sure you discuss any questions you  have with your healthcare provider. Document Revised: 09/26/2019 Document Reviewed: 09/26/2019 Elsevier Patient Education  2022 [employer].

## 2020-10-18 ENCOUNTER — Other Ambulatory Visit: Payer: Self-pay | Admitting: Adult Health

## 2020-10-18 DIAGNOSIS — G4700 Insomnia, unspecified: Secondary | ICD-10-CM

## 2020-10-25 ENCOUNTER — Other Ambulatory Visit: Payer: Self-pay

## 2020-10-25 ENCOUNTER — Ambulatory Visit (HOSPITAL_COMMUNITY): Payer: Federal, State, Local not specified - PPO | Attending: Cardiology

## 2020-10-25 DIAGNOSIS — Z952 Presence of prosthetic heart valve: Secondary | ICD-10-CM | POA: Insufficient documentation

## 2020-10-25 LAB — ECHOCARDIOGRAM COMPLETE
AR max vel: 1.19 cm2
AV Area VTI: 1.17 cm2
AV Area mean vel: 1.21 cm2
AV Mean grad: 29 mmHg
AV Peak grad: 45.6 mmHg
Ao pk vel: 3.38 m/s
Area-P 1/2: 3.16 cm2
S' Lateral: 3 cm

## 2020-10-28 ENCOUNTER — Telehealth: Payer: Self-pay | Admitting: Adult Health

## 2020-10-28 ENCOUNTER — Telehealth: Payer: Self-pay | Admitting: Interventional Cardiology

## 2020-10-28 NOTE — Telephone Encounter (Signed)
Spoke with pt and reviewed results and recommendations.  Pt would like to research the medications and call back with a decision.  States he doesn't really want to start another medication.  He would like to go back on his testosterone.  Offered an appt with Dr. Tamala Julian to discuss everything and pt would like to do his research first because he doesn't want to drive out here unless necessary.  Pt will call back once he makes a decision.

## 2020-10-28 NOTE — Telephone Encounter (Signed)
Noted  

## 2020-10-28 NOTE — Telephone Encounter (Signed)
Pt is returning a call from Friday in regards to his Echo results. Pt would prefer a callback on his HOME Phone and please leave a message if he does not answer.  Please advise pt further

## 2020-10-28 NOTE — Telephone Encounter (Signed)
FYI

## 2020-10-28 NOTE — Telephone Encounter (Signed)
Pt called to advise he had been taking Gabapentin (Neurontin) 300 mg a day.  Script was from 03/01/17.  He stated that it is giving him stomach issues to the point, he has stopped taking it and to advise Barnett Applebaum there is no need for a refill.  He stated it was helping him get a little more sleep, but it wasn't worth the stomach issues he is experiencing by using it.

## 2020-11-01 ENCOUNTER — Other Ambulatory Visit: Payer: Self-pay | Admitting: Adult Health

## 2020-11-01 DIAGNOSIS — F411 Generalized anxiety disorder: Secondary | ICD-10-CM

## 2020-11-01 DIAGNOSIS — F410 Panic disorder [episodic paroxysmal anxiety] without agoraphobia: Secondary | ICD-10-CM

## 2020-11-02 ENCOUNTER — Other Ambulatory Visit: Payer: Self-pay | Admitting: Adult Health

## 2020-11-02 DIAGNOSIS — G4700 Insomnia, unspecified: Secondary | ICD-10-CM

## 2020-11-04 NOTE — Telephone Encounter (Signed)
Last filled 9/1 appt on 12/15

## 2020-11-11 ENCOUNTER — Other Ambulatory Visit: Payer: Self-pay | Admitting: Adult Health

## 2020-11-11 ENCOUNTER — Telehealth: Payer: Self-pay | Admitting: Adult Health

## 2020-11-11 DIAGNOSIS — G4700 Insomnia, unspecified: Secondary | ICD-10-CM

## 2020-11-11 NOTE — Telephone Encounter (Signed)
Next visit is 01/30/21. Requesting refill on Ambien 10 mg called to:  [place], [place]  Phone:  [phone]  Fax:  [phone]

## 2020-11-11 NOTE — Telephone Encounter (Signed)
Last refill 10/18/20 Pended Rx

## 2021-01-25 ENCOUNTER — Other Ambulatory Visit: Payer: Self-pay | Admitting: Interventional Cardiology

## 2021-01-25 ENCOUNTER — Other Ambulatory Visit: Payer: Self-pay | Admitting: Adult Health

## 2021-01-25 DIAGNOSIS — G4700 Insomnia, unspecified: Secondary | ICD-10-CM

## 2021-01-30 ENCOUNTER — Ambulatory Visit (INDEPENDENT_AMBULATORY_CARE_PROVIDER_SITE_OTHER): Payer: Federal, State, Local not specified - PPO | Admitting: Adult Health

## 2021-01-30 ENCOUNTER — Other Ambulatory Visit: Payer: Self-pay

## 2021-01-30 ENCOUNTER — Encounter: Payer: Self-pay | Admitting: Adult Health

## 2021-01-30 DIAGNOSIS — F410 Panic disorder [episodic paroxysmal anxiety] without agoraphobia: Secondary | ICD-10-CM | POA: Diagnosis not present

## 2021-01-30 DIAGNOSIS — F331 Major depressive disorder, recurrent, moderate: Secondary | ICD-10-CM

## 2021-01-30 DIAGNOSIS — F411 Generalized anxiety disorder: Secondary | ICD-10-CM | POA: Diagnosis not present

## 2021-01-30 DIAGNOSIS — G4700 Insomnia, unspecified: Secondary | ICD-10-CM | POA: Diagnosis not present

## 2021-01-30 MED ORDER — ALPRAZOLAM 1 MG PO TABS: 1.0000 mg | ORAL_TABLET | Freq: Four times a day (QID) | ORAL | 2 refills | Status: DC

## 2021-01-30 MED ORDER — DULOXETINE HCL 60 MG PO CPEP: 120.0000 mg | ORAL_CAPSULE | Freq: Every day | ORAL | 5 refills | Status: DC

## 2021-01-30 MED ORDER — ZOLPIDEM TARTRATE 10 MG PO TABS: 10.0000 mg | ORAL_TABLET | Freq: Every day | ORAL | 2 refills | Status: DC

## 2021-01-30 MED ORDER — SERTRALINE HCL 100 MG PO TABS: 200.0000 mg | ORAL_TABLET | Freq: Every day | ORAL | 5 refills | Status: DC

## 2021-01-30 NOTE — Progress Notes (Signed)
Max Byrd [id number] 1955-04-20 65 y.o.  Subjective:   Patient ID:  Max Byrd is a 65 y.o. (DOB [birth year]) male.  Chief Complaint: No chief complaint on file.   HPI Max Byrd presents to the office today for follow-up of GAD, MDD, insomnia, and panic attacks.   Describes mood today as "not the best". Pleasant. Tearful at times. Reports depression, anxiety, and irritability. Stating "I'm not doing so good". Feels like medications are helpful. His PCP recently started him back on Testosterone and he is hopeful that his interest and energy will get better. Did well on it previously, but is was discontinued. Dealing with multiple medical problems - chronic pain.Talking with Hospice. Varying interest and motivation. Taking medications as prescribed. Energy levels decreased. Active, does not have a regular exercise routine - hopes to return to the gym. Enjoys some usual interests. Married. Lives with wife. Playing the guitar. Appetite adequate. Weight loss - 152 pounds.  Sleeps well most nights with Ambien. Averages 6 hours.  Focus and concentration difficulties. Completing tasks. Managing some aspects of household - laundry. Disabled.  Denies SI or HI.  Denies AH or VH.   [facility] Admission (Discharged) from 09/06/2017 in [facility]       Review of Systems:  Review of Systems  Musculoskeletal:  Negative for gait problem.  Neurological:  Negative for tremors.  Psychiatric/Behavioral:         Please refer to HPI   Medications: I have reviewed the patient's current medications.  Current Outpatient Medications  Medication Sig Dispense Refill   ALPRAZolam (XANAX) 1 MG tablet Take 1 tablet (1 mg total) by mouth 4 (four) times daily. 120 tablet 2   amLODipine (NORVASC) 5 MG tablet Take 1 tablet (5 mg total) by mouth daily. 90 tablet 3   brexpiprazole (REXULTI) 1 MG TABS tablet Take 1 tablet (1 mg total) by mouth  daily. (Patient not taking: Reported on 10/03/2020) 30 tablet 2   carisoprodol (SOMA) 350 MG tablet Take 175 mg by mouth 2 (two) times daily.     clindamycin (CLEOCIN) 300 MG capsule TAKE 2 CAPSULES 1 HOUR PRIOR TO DENTAL APPOINTMENT.     Diclofenac-miSOPROStol 50-0.2 MG TBEC Take 1 tablet by mouth 2 (two) times daily.     DULoxetine (CYMBALTA) 60 MG capsule Take 2 capsules (120 mg total) by mouth daily. 60 capsule 5   furosemide (LASIX) 40 MG tablet Take 40 mg by mouth 2 (two) times daily.     hyoscyamine (LEVSIN SL) 0.125 MG SL tablet Take 0.25 mg by mouth every 6 (six) hours as needed (IBS).      irbesartan (AVAPRO) 150 MG tablet Take 1 tablet (150 mg total) by mouth daily. 90 tablet 2   loperamide (IMODIUM A-D) 2 MG tablet Take 4 mg by mouth 3 (three) times daily as needed for diarrhea or loose stools.     morphine (MS CONTIN) 15 MG 12 hr tablet Take 15 mg by mouth 2 (two) times daily as needed.     Naphazoline HCl (CLEAR EYES OP) Place 1 drop into both eyes 3 (three) times daily.     nitroGLYCERIN (NITROSTAT) 0.4 MG SL tablet Place 1 tablet (0.4 mg total) under the tongue every 5 (five) minutes as needed for chest pain (MAX 3 TABLETS). 25 tablet 3   oxyCODONE (OXYCONTIN) 20 mg 12 hr tablet Take 20 mg by mouth every 12 (twelve) hours. (  Patient not taking: Reported on 10/03/2020)     oxyCODONE-acetaminophen (PERCOCET) 10-325 MG tablet Take 1 tablet by mouth every 8 (eight) hours.     sertraline (ZOLOFT) 100 MG tablet Take 2 tablets (200 mg total) by mouth daily. 60 tablet 5   testosterone cypionate (DEPOTESTOSTERONE CYPIONATE) 200 MG/ML injection Inject 200 mg once every 14 days (Patient not taking: Reported on 10/03/2020)  3   zolpidem (AMBIEN) 10 MG tablet Take 1 tablet (10 mg total) by mouth at bedtime. 30 tablet 2   No current facility-administered medications for this visit.    Medication Side Effects: None  Allergies:  Allergies  Allergen Reactions   Aspirin Other (See Comments)     H/O BLEEDING ULCER   Nsaids Other (See Comments)    Stomach bleeding   Tolmetin Other (See Comments)    Stomach bleeding   Zoloft [Sertraline Hcl] Other (See Comments)    FELT TERRIBLE   Klonopin [Clonazepam] Other (See Comments)    Erectile dysfunction   Beta Adrenergic Blockers Nausea Only    Metoprolol   Penicillins Nausea And Vomiting and Other (See Comments)    Has patient had a PCN reaction causing immediate rash, facial/tongue/throat swelling, SOB or lightheadedness with hypotension: No Has patient had a PCN reaction causing severe rash involving mucus membranes or skin necrosis: No Has patient had a PCN reaction that required hospitalization: No Has patient had a PCN reaction occurring within the last 10 years: No If all of the above answers are "NO", then may proceed with Cephalosporin use.    Reglan [Metoclopramide] Anxiety    EXCITABILITY     Past Medical History:  Diagnosis Date   Anginal pain ([place]) 2015   "since valve was done"   Anxiety    Arthritis    "left pointer finger; forminal openings bilaterally" (08/10/2013)   Bicuspid aortic valve    a. 01/2013 s/p AVR - 7mm Edwards 3300 TFX pericardial tissue valve, ser # [id number].   CKD (chronic kidney disease) stage 2, GFR 60-89 ml/min    Coronary atherosclerosis of native coronary artery    a. 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   Please see review of systems for further details on the patient's review from today.   Objective:   Physical Exam:  There were no vitals taken for this visit.  Physical Exam Constitutional:      General: He is not in acute distress. Musculoskeletal:        General: No deformity.  Neurological:     Mental Status: He is alert and oriented to person,  place, and time.     Coordination: Coordination normal.  Psychiatric:        Attention and Perception: Attention and perception normal. He does not perceive auditory or visual hallucinations.        Mood and Affect: Mood normal. Mood is not anxious or depressed. Affect is not labile, blunt, angry or inappropriate.        Speech: Speech normal.        Behavior: Behavior normal.        Thought Content: Thought content normal. Thought content is not paranoid or delusional. Thought content does not include homicidal or suicidal ideation. Thought content does not include homicidal or suicidal plan.        Cognition and Memory: Cognition and memory normal.        Judgment: Judgment normal.     Comments: Insight intact    Lab Review:     Component Value Date/Time   NA 140 09/01/2017 1018   NA 139 03/08/2014 1230   K 4.0 09/01/2017 1018   K 4.2 03/08/2014 1230   CL 105 09/01/2017 1018   CL 101 03/08/2014 1230   CO2 26 09/01/2017 1018   CO2 30 03/08/2014 1230   GLUCOSE 78 09/01/2017 1018   GLUCOSE 87 03/08/2014 1230   BUN 27 (H) 09/01/2017 1018   BUN 27 (H) 03/08/2014 1230   CREATININE 1.03 09/01/2017 1018   CREATININE 1.4 (H) 03/08/2014 1230   CALCIUM 8.9 09/01/2017 1018   CALCIUM 8.8 03/08/2014 1230   PROT 5.9 (L) 03/08/2014 1230   ALBUMIN 3.6 03/08/2014 1230   AST 24 03/08/2014 1230   ALT 23 03/08/2014 1230   ALKPHOS  51 03/08/2014 1230   BILITOT 1.00 03/08/2014 1230   GFRNONAA >60 09/01/2017 1018   GFRAA >60 09/01/2017 1018       Component Value Date/Time   WBC 7.3 09/01/2017 1018   RBC 4.87 09/01/2017 1018   HGB 16.4 09/01/2017 1018   HGB 13.7 07/03/2015 0818   HCT 46.4 09/01/2017 1018   HCT 38.3 (L) 07/03/2015 0818   PLT 129 (L) 09/01/2017 1018   PLT 146 07/03/2015 0818   MCV 95.3 09/01/2017 1018   MCV 88 07/03/2015 0818   MCH 33.7 09/01/2017 1018   MCHC 35.3 09/01/2017 1018   RDW 11.8 09/01/2017 1018   RDW 11.9 07/03/2015 0818   LYMPHSABS 1.0 09/01/2017 1018   LYMPHSABS 1.0 07/03/2015 0818   MONOABS 0.4 09/01/2017 1018   EOSABS 0.2 09/01/2017 1018   EOSABS 0.1 07/03/2015 0818   BASOSABS 0.0 09/01/2017 1018   BASOSABS 0.0 07/03/2015 0818    No results found for: POCLITH, LITHIUM   No results found for: PHENYTOIN, PHENOBARB, VALPROATE, CBMZ   .res Assessment: Plan:    Plan:  PDMP reviewed  1. Ambien 10mg  at hs 2. Xanax 1mg  4 x daily 3. Zoloft 100mg  2 daily 4. Cymbalta 60mg  BID  Started back on Testosterone  RTC 6 months   Patient advised to contact office with any questions, adverse effects, or acute worsening in signs and symptoms.  Discussed potential benefits, risk, and side effects of benzodiazepines to include potential risk of tolerance and dependence, as well as possible drowsiness. Advised patient not to drive if experiencing drowsiness and to take lowest possible effective dose to minimize risk of dependence and tolerance.   Diagnoses and all orders for this visit:  Panic attacks -     ALPRAZolam (XANAX) 1 MG tablet; Take 1 tablet (1 mg total) by  mouth 4 (four) times daily.  Generalized anxiety disorder -     ALPRAZolam (XANAX) 1 MG tablet; Take 1 tablet (1 mg total) by mouth 4 (four) times daily. -     DULoxetine (CYMBALTA) 60 MG capsule; Take 2 capsules (120 mg total) by mouth daily. -     sertraline (ZOLOFT) 100 MG tablet; Take 2 tablets (200 mg total)  by mouth daily.  Major depressive disorder, recurrent episode, moderate (HCC) -     DULoxetine (CYMBALTA) 60 MG capsule; Take 2 capsules (120 mg total) by mouth daily. -     sertraline (ZOLOFT) 100 MG tablet; Take 2 tablets (200 mg total) by mouth daily.  Insomnia, unspecified type -     zolpidem (AMBIEN) 10 MG tablet; Take 1 tablet (10 mg total) by mouth at bedtime.    Please see After Visit Summary for patient specific instructions.  No future appointments.   No orders of the defined types were placed in this encounter.   -------------------------------

## 2021-04-01 ENCOUNTER — Other Ambulatory Visit: Payer: Self-pay | Admitting: Adult Health

## 2021-04-01 ENCOUNTER — Other Ambulatory Visit: Payer: Self-pay | Admitting: Interventional Cardiology

## 2021-04-01 DIAGNOSIS — F331 Major depressive disorder, recurrent, moderate: Secondary | ICD-10-CM

## 2021-04-01 DIAGNOSIS — F411 Generalized anxiety disorder: Secondary | ICD-10-CM

## 2021-04-20 ENCOUNTER — Other Ambulatory Visit: Payer: Self-pay | Admitting: Adult Health

## 2021-04-20 DIAGNOSIS — F410 Panic disorder [episodic paroxysmal anxiety] without agoraphobia: Secondary | ICD-10-CM

## 2021-04-20 DIAGNOSIS — F411 Generalized anxiety disorder: Secondary | ICD-10-CM

## 2021-04-20 DIAGNOSIS — G4700 Insomnia, unspecified: Secondary | ICD-10-CM

## 2021-04-23 ENCOUNTER — Other Ambulatory Visit: Payer: Self-pay | Admitting: Adult Health

## 2021-04-23 DIAGNOSIS — F411 Generalized anxiety disorder: Secondary | ICD-10-CM

## 2021-04-23 DIAGNOSIS — F331 Major depressive disorder, recurrent, moderate: Secondary | ICD-10-CM

## 2021-05-02 ENCOUNTER — Other Ambulatory Visit: Payer: Self-pay | Admitting: Adult Health

## 2021-05-02 DIAGNOSIS — G4700 Insomnia, unspecified: Secondary | ICD-10-CM

## 2021-05-02 DIAGNOSIS — F411 Generalized anxiety disorder: Secondary | ICD-10-CM

## 2021-05-02 DIAGNOSIS — F410 Panic disorder [episodic paroxysmal anxiety] without agoraphobia: Secondary | ICD-10-CM

## 2021-07-15 ENCOUNTER — Other Ambulatory Visit: Payer: Self-pay | Admitting: Adult Health

## 2021-07-15 DIAGNOSIS — F410 Panic disorder [episodic paroxysmal anxiety] without agoraphobia: Secondary | ICD-10-CM

## 2021-07-15 DIAGNOSIS — F411 Generalized anxiety disorder: Secondary | ICD-10-CM

## 2021-07-15 DIAGNOSIS — G4700 Insomnia, unspecified: Secondary | ICD-10-CM

## 2021-07-25 ENCOUNTER — Other Ambulatory Visit: Payer: Self-pay | Admitting: Adult Health

## 2021-07-25 DIAGNOSIS — F411 Generalized anxiety disorder: Secondary | ICD-10-CM

## 2021-07-25 DIAGNOSIS — G4700 Insomnia, unspecified: Secondary | ICD-10-CM

## 2021-07-25 DIAGNOSIS — F410 Panic disorder [episodic paroxysmal anxiety] without agoraphobia: Secondary | ICD-10-CM

## 2021-07-28 ENCOUNTER — Other Ambulatory Visit: Payer: Self-pay | Admitting: Adult Health

## 2021-07-28 DIAGNOSIS — G4700 Insomnia, unspecified: Secondary | ICD-10-CM

## 2021-07-28 DIAGNOSIS — F410 Panic disorder [episodic paroxysmal anxiety] without agoraphobia: Secondary | ICD-10-CM

## 2021-07-28 DIAGNOSIS — F411 Generalized anxiety disorder: Secondary | ICD-10-CM

## 2021-07-29 ENCOUNTER — Telehealth: Payer: Self-pay | Admitting: Adult Health

## 2021-07-29 NOTE — Telephone Encounter (Signed)
LAST FILLED 5/15 APPT ON 6/15

## 2021-07-29 NOTE — Telephone Encounter (Signed)
Next visit is 07/31/21. Requesting refills on Alprazolam and Ambien called to:  [facility], [place]  Phone:  [phone]  Fax:  [phone]

## 2021-07-29 NOTE — Telephone Encounter (Signed)
PENDED

## 2021-07-31 ENCOUNTER — Ambulatory Visit (INDEPENDENT_AMBULATORY_CARE_PROVIDER_SITE_OTHER): Payer: Federal, State, Local not specified - PPO | Admitting: Adult Health

## 2021-07-31 ENCOUNTER — Encounter: Payer: Self-pay | Admitting: Adult Health

## 2021-07-31 DIAGNOSIS — F331 Major depressive disorder, recurrent, moderate: Secondary | ICD-10-CM | POA: Diagnosis not present

## 2021-07-31 DIAGNOSIS — G4700 Insomnia, unspecified: Secondary | ICD-10-CM

## 2021-07-31 DIAGNOSIS — F411 Generalized anxiety disorder: Secondary | ICD-10-CM

## 2021-07-31 DIAGNOSIS — F410 Panic disorder [episodic paroxysmal anxiety] without agoraphobia: Secondary | ICD-10-CM | POA: Diagnosis not present

## 2021-07-31 MED ORDER — ZOLPIDEM TARTRATE 10 MG PO TABS
10.0000 mg | ORAL_TABLET | Freq: Every day | ORAL | 2 refills | Status: DC
Start: 2021-07-31 — End: 2022-01-16

## 2021-07-31 MED ORDER — SERTRALINE HCL 100 MG PO TABS
200.0000 mg | ORAL_TABLET | Freq: Every day | ORAL | 5 refills | Status: DC
Start: 2021-07-31 — End: 2021-08-20

## 2021-07-31 MED ORDER — ALPRAZOLAM 1 MG PO TABS: 1.0000 mg | ORAL_TABLET | Freq: Four times a day (QID) | ORAL | 2 refills | Status: DC

## 2021-07-31 MED ORDER — DULOXETINE HCL 60 MG PO CPEP
120.0000 mg | ORAL_CAPSULE | Freq: Every day | ORAL | 5 refills | Status: DC
Start: 2021-07-31 — End: 2022-02-12

## 2021-07-31 NOTE — Progress Notes (Signed)
Max Byrd [id number] October 21, 1955 66 y.o.  Subjective:   Patient ID:  Max Byrd is a 66 y.o. (DOB 05-Sep-1955) male.  Chief Complaint: No chief complaint on file.   HPI Max Byrd presents to the office today for follow-up of GAD, MDD, insomnia, and panic attacks.   Describes mood today as "about the same". Pleasant. Tearful at times. Reports depression, anxiety, and irritability. Stating "I'm not doing good". Feels "overwhelmed" with body issues. Health continues to decline. Feels like medications are helpful. Started back on Testosterone - "not as helpful". Dealing with multiple medical problems - chronic pain. Recent colonoscopy revealed cancer cells, but he has decided not to pursue further testing. Has contacted hospice. Varying interest and motivation. Taking medications as prescribed. Energy levels decreased. Active, does not have a regular exercise routine. Enjoys some usual interests. Married. Lives with wife. No longer able to play the guitar. Appetite adequate. Weight loss - 150 pounds.  Sleeps well most nights with Ambien. Averages 10 to 12 hours. Has some days where he doesn't sleep. Focus and concentration difficulties. Completing tasks. Managing some aspects of household. Disabled - health issues.  Denies SI or HI.  Denies AH or VH.   [facility] Admission (Discharged) from 09/06/2017 in [facility]        Review of Systems:  Review of Systems  Musculoskeletal:  Negative for gait problem.  Neurological:  Negative for tremors.  Psychiatric/Behavioral:         Please refer to HPI    Medications: I have reviewed the patient's current medications.  Current Outpatient Medications  Medication Sig Dispense Refill   ALPRAZolam (XANAX) 1 MG tablet TAKE ONE TABLET BY MOUTH 4 TIMES DAILY 120 tablet 2   amLODipine (NORVASC) 5 MG tablet Take 1 tablet (5 mg total) by mouth daily. 90 tablet 3   brexpiprazole  (REXULTI) 1 MG TABS tablet Take 1 tablet (1 mg total) by mouth daily. (Patient not taking: Reported on 10/03/2020) 30 tablet 2   carisoprodol (SOMA) 350 MG tablet Take 175 mg by mouth 2 (two) times daily.     clindamycin (CLEOCIN) 300 MG capsule TAKE 2 CAPSULES 1 HOUR PRIOR TO DENTAL APPOINTMENT.     Diclofenac-miSOPROStol 50-0.2 MG TBEC Take 1 tablet by mouth 2 (two) times daily.     DULoxetine (CYMBALTA) 60 MG capsule Take 2 capsules (120 mg total) by mouth daily. 60 capsule 3   furosemide (LASIX) 40 MG tablet Take 40 mg by mouth 2 (two) times daily.     hyoscyamine (LEVSIN SL) 0.125 MG SL tablet Take 0.25 mg by mouth every 6 (six) hours as needed (IBS).      irbesartan (AVAPRO) 150 MG tablet Take 1 tablet (150 mg total) by mouth daily. 90 tablet 2   loperamide (IMODIUM A-D) 2 MG tablet Take 4 mg by mouth 3 (three) times daily as needed for diarrhea or loose stools.     morphine (MS CONTIN) 15 MG 12 hr tablet Take 15 mg by mouth 2 (two) times daily as needed.     Naphazoline HCl (CLEAR EYES OP) Place 1 drop into both eyes 3 (three) times daily.     nitroGLYCERIN (NITROSTAT) 0.4 MG SL tablet Place 1 tablet (0.4 mg total) under the tongue every 5 (five) minutes as needed for chest pain (MAX 3 TABLETS). 25 tablet 7   oxyCODONE (OXYCONTIN) 20 mg 12 hr tablet Take 20 mg  by mouth every 12 (twelve) hours. (Patient not taking: Reported on 10/03/2020)     oxyCODONE-acetaminophen (PERCOCET) 10-325 MG tablet Take 1 tablet by mouth every 8 (eight) hours.     sertraline (ZOLOFT) 100 MG tablet Take 2 tablets (200 mg total) by mouth daily. 60 tablet 3   testosterone cypionate (DEPOTESTOSTERONE CYPIONATE) 200 MG/ML injection Inject 200 mg once every 14 days (Patient not taking: Reported on 10/03/2020)  3   zolpidem (AMBIEN) 10 MG tablet Take 1 tablet (10 mg total) by mouth at bedtime. 30 tablet 2   No current facility-administered medications for this visit.    Medication Side Effects: None  Allergies:   Allergies  Allergen Reactions   Aspirin Other (See Comments)    H/O BLEEDING ULCER   Nsaids Other (See Comments)    Stomach bleeding   Tolmetin Other (See Comments)    Stomach bleeding   Zoloft [Sertraline Hcl] Other (See Comments)    FELT TERRIBLE   Klonopin [Clonazepam] Other (See Comments)    Erectile dysfunction   Beta Adrenergic Blockers Nausea Only    Metoprolol   Penicillins Nausea And Vomiting and Other (See Comments)    Has patient had a PCN reaction causing immediate rash, facial/tongue/throat swelling, SOB or lightheadedness with hypotension: No Has patient had a PCN reaction causing severe rash involving mucus membranes or skin necrosis: No Has patient had a PCN reaction that required hospitalization: No Has patient had a PCN reaction occurring within the last 10 years: No If all of the above answers are "NO", then may proceed with Cephalosporin use.    Reglan [Metoclopramide] Anxiety    EXCITABILITY     Past Medical History:  Diagnosis Date   Anginal pain ([place]) 2015   "since valve was done"   Anxiety    Arthritis    "left pointer finger; forminal openings bilaterally" (08/10/2013)   Bicuspid aortic valve    a. 01/2013 s/p AVR - 91m Edwards 3300 TFX pericardial tissue valve, ser # [id number]   CKD (chronic kidney disease) stage 2, GFR 60-89 ml/min    Coronary atherosclerosis of native coronary artery    a. 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   Please see review of systems for further details on the patient's review from today.   Objective:   Physical Exam:  There were no vitals taken for this visit.  Physical Exam Constitutional:      General: He is not in acute distress. Musculoskeletal:        General: No deformity.  Neurological:     Mental Status: He  is alert and oriented to person, place, and time.     Coordination: Coordination normal.  Psychiatric:        Attention and Perception: Attention and perception normal. He does not perceive auditory or visual hallucinations.        Mood and Affect: Mood normal. Mood is not anxious or depressed. Affect is not labile, blunt, angry or inappropriate.        Speech: Speech normal.        Behavior: Behavior normal.        Thought Content: Thought content normal. Thought content is not paranoid or delusional. Thought content does not include homicidal or suicidal ideation. Thought content does not include homicidal or suicidal plan.        Cognition and Memory: Cognition and memory normal.        Judgment: Judgment normal.     Comments: Insight intact     Lab Review:     Component Value Date/Time   NA 140 09/01/2017 1018   NA 139 03/08/2014 1230   K 4.0 09/01/2017 1018   K 4.2 03/08/2014 1230   CL 105 09/01/2017 1018   CL 101 03/08/2014 1230   CO2 26 09/01/2017 1018   CO2 30 03/08/2014 1230   GLUCOSE 78 09/01/2017 1018   GLUCOSE 87 03/08/2014 1230   BUN 27 (H) 09/01/2017 1018   BUN 27 (H) 03/08/2014 1230   CREATININE 1.03 09/01/2017 1018   CREATININE 1.4 (H) 03/08/2014 1230   CALCIUM 8.9 09/01/2017 1018   CALCIUM 8.8 03/08/2014 1230   PROT 5.9 (L) 03/08/2014 1230   ALBUMIN 3.6 03/08/2014 1230    AST 24 03/08/2014 1230   ALT 23 03/08/2014 1230   ALKPHOS 51 03/08/2014 1230   BILITOT 1.00 03/08/2014 1230   GFRNONAA >60 09/01/2017 1018   GFRAA >60 09/01/2017 1018       Component Value Date/Time   WBC 7.3 09/01/2017 1018   RBC 4.87 09/01/2017 1018   HGB 16.4 09/01/2017 1018   HGB 13.7 07/03/2015 0818   HCT 46.4 09/01/2017 1018   HCT 38.3 (L) 07/03/2015 0818   PLT 129 (L) 09/01/2017 1018   PLT 146 07/03/2015 0818   MCV 95.3 09/01/2017 1018   MCV 88 07/03/2015 0818   MCH 33.7 09/01/2017 1018   MCHC 35.3 09/01/2017 1018   RDW 11.8 09/01/2017 1018   RDW 11.9 07/03/2015 0818   LYMPHSABS 1.0 09/01/2017 1018   LYMPHSABS 1.0 07/03/2015 0818   MONOABS 0.4 09/01/2017 1018   EOSABS 0.2 09/01/2017 1018   EOSABS 0.1 07/03/2015 0818   BASOSABS 0.0 09/01/2017 1018   BASOSABS 0.0 07/03/2015 0818    No results found for: "POCLITH", "LITHIUM"   No results found for: "PHENYTOIN", "PHENOBARB", "VALPROATE", "CBMZ"   .res Assessment: Plan:    Plan:  PDMP reviewed  1. Ambien '10mg'$  at hs 2. Xanax '1mg'$  4 x daily 3. Zoloft '100mg'$  2 daily 4. Cymbalta '60mg'$  BID   RTC 6 months   Patient advised to contact office with any questions, adverse effects, or acute worsening in signs and symptoms.  Discussed potential benefits, risk, and side effects of benzodiazepines to include potential risk of tolerance and dependence, as well as possible drowsiness. Advised patient not to drive if experiencing drowsiness and to take lowest possible effective dose to minimize risk of dependence and tolerance. There are no diagnoses linked to this encounter.   Please see After Visit Summary for patient specific instructions.  Future Appointments  Date Time Provider  [place]  07/31/2021  8:00 AM Demetrio Leighty, Berdie Ogren, NP CP-CP None    No orders of the defined types were placed in this encounter.   -------------------------------

## 2021-08-20 ENCOUNTER — Other Ambulatory Visit: Payer: Self-pay | Admitting: Adult Health

## 2021-08-20 DIAGNOSIS — F411 Generalized anxiety disorder: Secondary | ICD-10-CM

## 2021-08-20 DIAGNOSIS — F331 Major depressive disorder, recurrent, moderate: Secondary | ICD-10-CM

## 2021-09-08 ENCOUNTER — Other Ambulatory Visit: Payer: Self-pay | Admitting: Interventional Cardiology

## 2021-10-09 ENCOUNTER — Other Ambulatory Visit: Payer: Self-pay | Admitting: Adult Health

## 2021-10-09 DIAGNOSIS — G4700 Insomnia, unspecified: Secondary | ICD-10-CM

## 2021-11-07 ENCOUNTER — Other Ambulatory Visit: Payer: Self-pay | Admitting: Adult Health

## 2021-11-07 DIAGNOSIS — F411 Generalized anxiety disorder: Secondary | ICD-10-CM

## 2021-11-07 DIAGNOSIS — F410 Panic disorder [episodic paroxysmal anxiety] without agoraphobia: Secondary | ICD-10-CM

## 2021-11-19 ENCOUNTER — Other Ambulatory Visit: Payer: Self-pay | Admitting: Adult Health

## 2021-11-19 DIAGNOSIS — F410 Panic disorder [episodic paroxysmal anxiety] without agoraphobia: Secondary | ICD-10-CM

## 2021-11-19 DIAGNOSIS — F411 Generalized anxiety disorder: Secondary | ICD-10-CM

## 2021-11-19 NOTE — Telephone Encounter (Signed)
Due 9/5

## 2021-12-19 ENCOUNTER — Other Ambulatory Visit: Payer: Self-pay | Admitting: Adult Health

## 2021-12-19 DIAGNOSIS — G4700 Insomnia, unspecified: Secondary | ICD-10-CM

## 2021-12-26 ENCOUNTER — Other Ambulatory Visit: Payer: Self-pay | Admitting: Internal Medicine

## 2021-12-26 ENCOUNTER — Other Ambulatory Visit (HOSPITAL_BASED_OUTPATIENT_CLINIC_OR_DEPARTMENT_OTHER): Payer: Self-pay | Admitting: Internal Medicine

## 2021-12-26 DIAGNOSIS — R410 Disorientation, unspecified: Secondary | ICD-10-CM

## 2021-12-27 ENCOUNTER — Other Ambulatory Visit: Payer: Self-pay | Admitting: Adult Health

## 2021-12-27 DIAGNOSIS — F411 Generalized anxiety disorder: Secondary | ICD-10-CM

## 2021-12-27 DIAGNOSIS — F331 Major depressive disorder, recurrent, moderate: Secondary | ICD-10-CM

## 2022-01-09 ENCOUNTER — Other Ambulatory Visit: Payer: Self-pay | Admitting: Adult Health

## 2022-01-09 DIAGNOSIS — G4700 Insomnia, unspecified: Secondary | ICD-10-CM

## 2022-01-12 NOTE — Telephone Encounter (Signed)
Pt called at 2:31p for refill of Alprazolam and Zolpidem to [facility].  He seems to think the Alprazolam wasn't available even though it's showing 2 refills.  Next appt 12/12

## 2022-01-12 NOTE — Telephone Encounter (Signed)
Last filled 11/3, due 12/1

## 2022-01-13 NOTE — Telephone Encounter (Signed)
LVM to [facility]. Patient got both the alprazolam and zolpidem filled on 11/3, is not due for a refill until 12/1.

## 2022-01-27 ENCOUNTER — Ambulatory Visit: Payer: Federal, State, Local not specified - PPO | Admitting: Adult Health

## 2022-01-31 ENCOUNTER — Other Ambulatory Visit: Payer: Self-pay | Admitting: Adult Health

## 2022-01-31 DIAGNOSIS — F410 Panic disorder [episodic paroxysmal anxiety] without agoraphobia: Secondary | ICD-10-CM

## 2022-01-31 DIAGNOSIS — F411 Generalized anxiety disorder: Secondary | ICD-10-CM

## 2022-02-02 ENCOUNTER — Telehealth: Payer: Self-pay | Admitting: Adult Health

## 2022-02-02 NOTE — Telephone Encounter (Signed)
Called pt to r/s appt with Barnett Applebaum.  He said he had planned on talking to her about not being able to sleep even though he is taking Ambien '10mg'$ .  He said sometimes it works, but only for 8 hours and sometimes it doesn't work and after an hour he takes another one.  Therefore he may run out a day or two early.  He has fallen and he has lost weight from 178 pcs to 135.  Pls advise if there is anything that can be done in Gina's absence.  Next appt 1/11

## 2022-02-02 NOTE — Telephone Encounter (Signed)
Please review for gina

## 2022-02-03 ENCOUNTER — Ambulatory Visit: Payer: Federal, State, Local not specified - PPO | Admitting: Adult Health

## 2022-02-03 NOTE — Telephone Encounter (Signed)
He cannot take more Ambien than prescribed or it will be discontinued.If one of the tablets doesn't work, 2 will not be helpful. How many days is he getting the 8 hours and how many days not sleeping?

## 2022-02-03 NOTE — Telephone Encounter (Signed)
LVM to rtc 

## 2022-02-04 NOTE — Telephone Encounter (Signed)
Patient notified of recommendations. He said he accepts this and wanted me to let you know that you are in his prayers.

## 2022-02-04 NOTE — Telephone Encounter (Signed)
I cannot increase the dose any higher with the amount of Xanax he is taking. He can try taking 1/2 of the Ambien when going to sleep and the other half when he wakes to to go to the restroom.

## 2022-02-04 NOTE — Telephone Encounter (Signed)
Noted. Ty!

## 2022-02-10 ENCOUNTER — Other Ambulatory Visit: Payer: Self-pay | Admitting: Adult Health

## 2022-02-10 DIAGNOSIS — F411 Generalized anxiety disorder: Secondary | ICD-10-CM

## 2022-02-10 DIAGNOSIS — F331 Major depressive disorder, recurrent, moderate: Secondary | ICD-10-CM

## 2022-02-11 ENCOUNTER — Other Ambulatory Visit: Payer: Self-pay | Admitting: Adult Health

## 2022-02-11 DIAGNOSIS — F411 Generalized anxiety disorder: Secondary | ICD-10-CM

## 2022-02-11 DIAGNOSIS — F410 Panic disorder [episodic paroxysmal anxiety] without agoraphobia: Secondary | ICD-10-CM

## 2022-02-12 NOTE — Telephone Encounter (Signed)
Filled 12/1 appt 1/11

## 2022-02-26 ENCOUNTER — Ambulatory Visit (INDEPENDENT_AMBULATORY_CARE_PROVIDER_SITE_OTHER): Payer: Self-pay | Admitting: Adult Health

## 2022-02-26 DIAGNOSIS — F489 Nonpsychotic mental disorder, unspecified: Secondary | ICD-10-CM

## 2022-02-26 NOTE — Progress Notes (Signed)
Patient no show appointment. ? ?

## 2022-02-27 ENCOUNTER — Other Ambulatory Visit: Payer: Self-pay | Admitting: Internal Medicine

## 2022-02-27 ENCOUNTER — Other Ambulatory Visit: Payer: Federal, State, Local not specified - PPO

## 2022-02-27 DIAGNOSIS — Z85528 Personal history of other malignant neoplasm of kidney: Secondary | ICD-10-CM

## 2022-02-27 DIAGNOSIS — R634 Abnormal weight loss: Secondary | ICD-10-CM

## 2022-02-27 DIAGNOSIS — R109 Unspecified abdominal pain: Secondary | ICD-10-CM

## 2022-03-09 ENCOUNTER — Other Ambulatory Visit: Payer: Self-pay | Admitting: Adult Health

## 2022-03-09 DIAGNOSIS — F411 Generalized anxiety disorder: Secondary | ICD-10-CM

## 2022-03-09 DIAGNOSIS — F331 Major depressive disorder, recurrent, moderate: Secondary | ICD-10-CM

## 2022-03-12 ENCOUNTER — Encounter: Payer: Self-pay | Admitting: Adult Health

## 2022-03-12 ENCOUNTER — Ambulatory Visit (INDEPENDENT_AMBULATORY_CARE_PROVIDER_SITE_OTHER): Payer: Federal, State, Local not specified - PPO | Admitting: Adult Health

## 2022-03-12 DIAGNOSIS — G4700 Insomnia, unspecified: Secondary | ICD-10-CM | POA: Diagnosis not present

## 2022-03-12 DIAGNOSIS — F411 Generalized anxiety disorder: Secondary | ICD-10-CM | POA: Diagnosis not present

## 2022-03-12 DIAGNOSIS — F331 Major depressive disorder, recurrent, moderate: Secondary | ICD-10-CM

## 2022-03-12 DIAGNOSIS — F410 Panic disorder [episodic paroxysmal anxiety] without agoraphobia: Secondary | ICD-10-CM | POA: Diagnosis not present

## 2022-03-12 MED ORDER — SERTRALINE HCL 100 MG PO TABS: 200.0000 mg | ORAL_TABLET | Freq: Every day | ORAL | 5 refills | Status: DC

## 2022-03-12 MED ORDER — ALPRAZOLAM 1 MG PO TABS: 1.0000 mg | ORAL_TABLET | Freq: Four times a day (QID) | ORAL | 2 refills | Status: DC

## 2022-03-12 MED ORDER — ZOLPIDEM TARTRATE 10 MG PO TABS: 10.0000 mg | ORAL_TABLET | Freq: Every day | ORAL | 2 refills | Status: DC

## 2022-03-12 MED ORDER — DULOXETINE HCL 60 MG PO CPEP: 120.0000 mg | ORAL_CAPSULE | Freq: Every day | ORAL | 5 refills | Status: DC

## 2022-03-12 NOTE — Progress Notes (Signed)
Max Byrd [id number] January 16, 1956 67 y.o.  Subjective:   Patient ID:  Max Byrd is a 67 y.o. (DOB Jun 05, 1955) male.  Chief Complaint: No chief complaint on file.   HPI Max Byrd presents to the office today for follow-up of GAD, MDD, insomnia, and panic attacks.   Describes mood today as "about the same". Pleasant. Tearful at times. Mood symptoms - reports depression, anxiety, and irritability. Feels overwhelmed at times. Mood is variable. Stating "I'm doing ok - getting up every day". Feels like medications are helpful. Health continues to decline. Reports multiple medical issues. Varying interest and motivation. Taking medications as prescribed. Energy levels lower. Active, does not have a regular exercise routine. Enjoys some usual interests. Married. Lives with wife. No longer able to play the guitar. Appetite adequate. Weight loss - 135 pounds.  Sleeps well most nights with Ambien. Averages 10 to 12 hours.  Focus and concentration difficulties. Completing tasks. Managing some aspects of household. Disabled - health issues.  Denies SI or HI.  Denies AH or VH. Denies substance use. Denies self harm.   [facility]mission (Discharged) from 09/06/2017 in [facility]        Review of Systems:  Review of Systems  Musculoskeletal:  Negative for gait problem.  Neurological:  Negative for tremors.  Psychiatric/Behavioral:         Please refer to HPI    Medications: I have reviewed the patient's current medications.  Current Outpatient Medications  Medication Sig Dispense Refill   ALPRAZolam (XANAX) 1 MG tablet TAKE ONE TABLET BY MOUTH 4 TIMES DAILY 120 tablet 0   amLODipine (NORVASC) 5 MG tablet Take 1 tablet (5 mg total) by mouth daily. 90 tablet 3   carisoprodol (SOMA) 350 MG tablet Take 175 mg by mouth 2 (two) times daily.     clindamycin (CLEOCIN) 300 MG capsule TAKE 2 CAPSULES 1 HOUR PRIOR TO DENTAL  APPOINTMENT.     Diclofenac-miSOPROStol 50-0.2 MG TBEC Take 1 tablet by mouth 2 (two) times daily.     DULoxetine (CYMBALTA) 60 MG capsule Take 2 capsules (120 mg total) by mouth daily. 60 capsule 0   furosemide (LASIX) 40 MG tablet Take 40 mg by mouth 2 (two) times daily.     hyoscyamine (LEVSIN SL) 0.125 MG SL tablet Take 0.25 mg by mouth every 6 (six) hours as needed (IBS).      irbesartan (AVAPRO) 150 MG tablet Take 1 tablet (150 mg total) by mouth daily. 90 tablet 0   loperamide (IMODIUM A-D) 2 MG tablet Take 4 mg by mouth 3 (three) times daily as needed for diarrhea or loose stools.     morphine (MS CONTIN) 15 MG 12 hr tablet Take 15 mg by mouth 2 (two) times daily as needed.     Naphazoline HCl (CLEAR EYES OP) Place 1 drop into both eyes 3 (three) times daily.     nitroGLYCERIN (NITROSTAT) 0.4 MG SL tablet Place 1 tablet (0.4 mg total) under the tongue every 5 (five) minutes as needed for chest pain (MAX 3 TABLETS). 25 tablet 7   oxyCODONE (OXYCONTIN) 20 mg 12 hr tablet Take 20 mg by mouth every 12 (twelve) hours. (Patient not taking: Reported on 10/03/2020)     oxyCODONE-acetaminophen (PERCOCET) 10-325 MG tablet Take 1 tablet by mouth every 8 (eight) hours.     sertraline (ZOLOFT) 100 MG tablet Take 2 tablets (200 mg total) by  mouth daily. 60 tablet 0   testosterone cypionate (DEPOTESTOSTERONE CYPIONATE) 200 MG/ML injection Inject 200 mg once every 14 days (Patient not taking: Reported on 10/03/2020)  3   zolpidem (AMBIEN) 10 MG tablet TAKE ONE TABLET BY MOUTH EVERY DAY AT BEDTIME 30 tablet 2   No current facility-administered medications for this visit.    Medication Side Effects: None  Allergies:  Allergies  Allergen Reactions   Aspirin Other (See Comments)    H/O BLEEDING ULCER   Nsaids Other (See Comments)    Stomach bleeding   Tolmetin Other (See Comments)    Stomach bleeding   Zoloft [Sertraline Hcl] Other (See Comments)    FELT TERRIBLE   Klonopin [Clonazepam] Other (See  Comments)    Erectile dysfunction   Beta Adrenergic Blockers Nausea Only    Metoprolol   Penicillins Nausea And Vomiting and Other (See Comments)    Has patient had a PCN reaction causing immediate rash, facial/tongue/throat swelling, SOB or lightheadedness with hypotension: No Has patient had a PCN reaction causing severe rash involving mucus membranes or skin necrosis: No Has patient had a PCN reaction that required hospitalization: No Has patient had a PCN reaction occurring within the last 10 years: No If all of the above answers are "NO", then may proceed with Cephalosporin use.    Reglan [Metoclopramide] Anxiety    EXCITABILITY     Past Medical History:  Diagnosis Date   Anginal pain ([place]) 2015   "since valve was done"   Anxiety    Arthritis    "left pointer finger; forminal openings bilaterally" (08/10/2013)   Bicuspid aortic valve    a. 01/2013 s/p AVR - 64m Edwards 3300 TFX pericardial tissue valve, ser # [id number]   CKD (chronic kidney disease) stage 2, GFR 60-89 ml/min    Coronary atherosclerosis of native coronary artery    a. 01/2013 CABGx3: LIMA->LAD, VG->Diag, VG->OM (performed @ time of AVR),  occluded SVG-OM2 , now with 95% stenosis at the anastomosis site on OM 2 - PCI attempt 07/28/13 unsuccessful - med Rx cont'd    Depression    Erectile dysfunction    INJECTIONS OF PROSTAGLANDIN BY UROLGIST   Essential hypertension, benign    GERD (gastroesophageal reflux disease)    H/O Bell's palsy 2010 X2   Heart murmur    History of blood transfusion    "related to stomach bleeding from ASA & NSAIDS   History of stomach ulcers    Hyperlipidemia    IBS (irritable bowel syndrome)    Migraine    "went away in the 1990's"   Pain, chronic postoperative    S/P LEFT ARM SURGERY   [place] of right kidney [place]    Past Medical History, Surgical history, Social history, and Family history were reviewed and updated as appropriate.   Please see review of  systems for further details on the patient's review from today.   Objective:   Physical Exam:  There were no vitals taken for this visit.  Physical Exam Constitutional:      General: He is not in acute distress. Musculoskeletal:        General: No deformity.  Neurological:     Mental Status: He is alert and oriented to person, place, and time.     Coordination: Coordination normal.  Psychiatric:        Attention and Perception: Attention and perception normal. He does not perceive auditory or visual hallucinations.  Mood and Affect: Mood normal. Mood is not anxious or depressed. Affect is not labile, blunt, angry or inappropriate.        Speech: Speech normal.        Behavior: Behavior normal.        Thought Content: Thought content normal. Thought content is not paranoid or delusional. Thought content does not include homicidal or suicidal ideation. Thought content does not include homicidal or suicidal plan.        Cognition and Memory: Cognition and memory normal.        Judgment: Judgment normal.     Comments: Insight intact     Lab Review:     Component Value Date/Time   NA 140 09/01/2017 1018   NA 139 03/08/2014 1230   K 4.0 09/01/2017 1018   K 4.2 03/08/2014 1230   CL 105 09/01/2017 1018   CL 101 03/08/2014 1230   CO2 26 09/01/2017 1018   CO2 30 03/08/2014 1230   GLUCOSE 78 09/01/2017 1018   GLUCOSE 87 03/08/2014 1230   BUN 27 (H) 09/01/2017 1018   BUN 27 (H) 03/08/2014 1230   CREATININE 1.03 09/01/2017 1018   CREATININE 1.4 (H) 03/08/2014 1230   CALCIUM 8.9 09/01/2017 1018   CALCIUM 8.8 03/08/2014 1230   PROT 5.9 (L) 03/08/2014 1230   ALBUMIN 3.6 03/08/2014 1230   AST 24 03/08/2014 1230   ALT 23 03/08/2014 1230   ALKPHOS 51 03/08/2014 1230   BILITOT 1.00 03/08/2014 1230   GFRNONAA >60 09/01/2017 1018   GFRAA >60 09/01/2017 1018       Component Value Date/Time   WBC 7.3 09/01/2017 1018   RBC 4.87 09/01/2017 1018   HGB 16.4 09/01/2017 1018    HGB 13.7 07/03/2015 0818   HCT 46.4 09/01/2017 1018   HCT 38.3 (L) 07/03/2015 0818   PLT 129 (L) 09/01/2017 1018   PLT 146 07/03/2015 0818   MCV 95.3 09/01/2017 1018   MCV 88 07/03/2015 0818   MCH 33.7 09/01/2017 1018   MCHC 35.3 09/01/2017 1018   RDW 11.8 09/01/2017 1018   RDW 11.9 07/03/2015 0818   LYMPHSABS 1.0 09/01/2017 1018   LYMPHSABS 1.0 07/03/2015 0818   MONOABS 0.4 09/01/2017 1018   EOSABS 0.2 09/01/2017 1018   EOSABS 0.1 07/03/2015 0818   BASOSABS 0.0 09/01/2017 1018   BASOSABS 0.0 07/03/2015 0818    No results found for: "POCLITH", "LITHIUM"   No results found for: "PHENYTOIN", "PHENOBARB", "VALPROATE", "CBMZ"   .res Assessment: Plan:    Plan:  PDMP reviewed  1. Ambien '10mg'$  at hs 2. Xanax '1mg'$  4 x daily 3. Zoloft '100mg'$  2 daily 4. Cymbalta '60mg'$  BID   RTC 6 months   Patient advised to contact office with any questions, adverse effects, or acute worsening in signs and symptoms.  Discussed potential benefits, risk, and side effects of benzodiazepines to include potential risk of tolerance and dependence, as well as possible drowsiness. Advised patient not to drive if experiencing drowsiness and to take lowest possible effective dose to minimize risk of dependence and tolerance. There are no diagnoses linked to this encounter.   Please see After Visit Summary for patient specific instructions.  Future Appointments  Date Time Provider [place]  03/12/2022  8:00 AM Lilla Callejo, Berdie Ogren, NP CP-CP None  03/30/2022  8:00 AM GI-315 CT 1 GI-315CT [facility]  03/30/2022  8:20 AM GI-315 CT 1 GI-315CT [facility] [facility]    No orders of the defined types were  placed in this encounter.   -------------------------------

## 2022-03-30 ENCOUNTER — Other Ambulatory Visit: Payer: Self-pay | Admitting: Internal Medicine

## 2022-03-30 ENCOUNTER — Ambulatory Visit
Admission: RE | Admit: 2022-03-30 | Discharge: 2022-03-30 | Disposition: A | Discharge status: Home / Self Care | Payer: Federal, State, Local not specified - PPO | Source: Ambulatory Visit | Attending: Internal Medicine | Admitting: Internal Medicine

## 2022-03-30 DIAGNOSIS — Z85528 Personal history of other malignant neoplasm of kidney: Secondary | ICD-10-CM

## 2022-03-30 DIAGNOSIS — R410 Disorientation, unspecified: Secondary | ICD-10-CM

## 2022-03-30 DIAGNOSIS — R634 Abnormal weight loss: Secondary | ICD-10-CM

## 2022-03-30 DIAGNOSIS — R109 Unspecified abdominal pain: Secondary | ICD-10-CM

## 2022-03-30 MED ORDER — IOPAMIDOL (ISOVUE-300) INJECTION 61%
100.0000 mL | Freq: Once | INTRAVENOUS | Status: AC | PRN
  Administered 2022-03-30: 100 mL via INTRAVENOUS

## 2022-04-09 NOTE — Progress Notes (Signed)
Cardiology Office Note:    Date:  04/13/2022   ID:  Max Byrd, DOB October 03, 1955, MRN [id number]  PCP:  Wenda Low, MD  [place] Providers Cardiologist:  Sinclair Grooms, MD (Inactive)     Referring MD: Wenda Low, MD   Chief Complaint:  Follow-up     History of Present Illness:   Max Byrd is a 67 y.o. male with  history of bicuspid aortic valve treated with pericardial tissue valve in 2014, CAD  CABG 02/14/13 with LIMA to LAD, SVG to OM 2, and SVG to diagonal - subsequent failed initial PCI with occluded SVG to OM - but then able to have DES to OM1 (prolonged and complicated PCI). Other issues with HTN, CKD, depression, renal cell carcinoma and chronic back pain.     Patient las saw Ms Dorene Ar 09/2020 with increased DOE and chest pain and mild to mod AS. He declined ischemic testing but agreed to echo-LVEF 55-60% grade 1 DD. Dr. Tamala Julian recommended [name] or [name] but patient declined.   Patient comes in with his wife. He says he's been declining rapidly over the past 6 weeks. He says they keep taking his testosterone away and he loses weight. He is very focused on increasing this but it's managed by PCP.  He has chest pain -sharp needle like pain, dull throbbing pain with heart beat and air bubble type pain at any time of day. Does laundry, takes out the trash-gets short of breath. They don't think CABG and valve repair helped in 2014. He's has symptoms since.      Past Medical History:  Diagnosis Date   Anginal pain ([place]) 2015   "since valve was done"   Anxiety    Arthritis    "left pointer finger; forminal openings bilaterally" (08/10/2013)   Bicuspid aortic valve    a. 01/2013 s/p AVR - 12m Edwards 3300 TFX pericardial tissue valve, ser # [id number]   CKD (chronic kidney disease) stage 2, GFR 60-89 ml/min    Coronary atherosclerosis of native coronary artery    a. 01/2013 CABGx3: LIMA->LAD, VG->Diag, VG->OM (performed @ time of AVR),  occluded SVG-OM2 ,  now with 95% stenosis at the anastomosis site on OM 2 - PCI attempt 07/28/13 unsuccessful - med Rx cont'd    Depression    Erectile dysfunction    INJECTIONS OF PROSTAGLANDIN BY UROLGIST   Essential hypertension, benign    GERD (gastroesophageal reflux disease)    H/O Bell's palsy 2010 X2   Heart murmur    History of blood transfusion    "related to stomach bleeding from ASA & NSAIDS   History of stomach ulcers    Hyperlipidemia    IBS (irritable bowel syndrome)    Migraine    "went away in the 1990's"   Pain, chronic postoperative    S/P LEFT ARM SURGERY   [place] of right kidney [place]   Current Medications: Current Meds  Medication Sig   ALPRAZolam (XANAX) 1 MG tablet Take 1 tablet (1 mg total) by mouth 4 (four) times daily.   amLODipine (NORVASC) 5 MG tablet Take 1 tablet (5 mg total) by mouth daily.   carisoprodol (SOMA) 350 MG tablet Take 175 mg by mouth 2 (two) times daily.   cholestyramine (QUESTRAN) 4 g packet Take 1 packet by mouth daily.   clindamycin (CLEOCIN) 300 MG capsule TAKE 2 CAPSULES 1 HOUR PRIOR TO DENTAL APPOINTMENT.   Diclofenac-miSOPROStol 50-0.2 MG TBEC Take 1  tablet by mouth 2 (two) times daily.   DULoxetine (CYMBALTA) 60 MG capsule Take 2 capsules (120 mg total) by mouth daily.   ENTRESTO 24-26 MG Take 1 tablet by mouth 2 (two) times daily.   furosemide (LASIX) 40 MG tablet Take 40 mg by mouth 2 (two) times daily.   hyoscyamine (LEVSIN SL) 0.125 MG SL tablet Take 0.25 mg by mouth every 6 (six) hours as needed (IBS).    morphine (MS CONTIN) 15 MG 12 hr tablet Take 15 mg by mouth 2 (two) times daily as needed.   nitroGLYCERIN (NITROSTAT) 0.4 MG SL tablet Place 1 tablet (0.4 mg total) under the tongue every 5 (five) minutes as needed for chest pain (MAX 3 TABLETS).   oxyCODONE-acetaminophen (PERCOCET) 10-325 MG tablet Take 1 tablet by mouth every 8 (eight) hours.   sertraline (ZOLOFT) 100 MG tablet Take 2 tablets (200 mg total) by mouth daily.    testosterone cypionate (DEPOTESTOSTERONE CYPIONATE) 200 MG/ML injection    zolpidem (AMBIEN) 10 MG tablet Take 1 tablet (10 mg total) by mouth at bedtime.   [DISCONTINUED] irbesartan (AVAPRO) 150 MG tablet Take 1 tablet (150 mg total) by mouth daily.   [DISCONTINUED] loperamide (IMODIUM A-D) 2 MG tablet Take 4 mg by mouth 3 (three) times daily as needed for diarrhea or loose stools.   [DISCONTINUED] Naphazoline HCl (CLEAR EYES OP) Place 1 drop into both eyes 3 (three) times daily.   [DISCONTINUED] oxyCODONE (OXYCONTIN) 20 mg 12 hr tablet Take 20 mg by mouth every 12 (twelve) hours.    Allergies:   Aspirin, Nsaids, Tolmetin, Zoloft [sertraline hcl], Klonopin [clonazepam], Beta adrenergic blockers, Penicillins, and Reglan [metoclopramide]   Social History   Tobacco Use   Smoking status: Former    Packs/day: 1.50    Years: 6.00    Total pack years: 9.00    Types: Cigarettes    Start date: 10/06/1969    Quit date: 01/21/1976    Years since quitting: 46.2   Smokeless tobacco: Current    Types: Snuff   Tobacco comments:    quit  36 years ago  Vaping Use   Vaping Use: Never used  Substance Use Topics   Alcohol use: Yes    Alcohol/week: 0.0 standard drinks of alcohol    Comment: "last beer was 01/2013"   Drug use: No    Family Hx: The patient's family history includes Cancer in his mother; Heart disease in his father.  ROS     Physical Exam:    VS:  BP 120/70   Pulse (!) 58   Ht '5\' 6"'$  (1.676 m)   Wt 144 lb 12.8 oz (65.7 kg)   SpO2 96%   BMI 23.37 kg/m     Wt Readings from Last 3 Encounters:  04/13/22 144 lb 12.8 oz (65.7 kg)  10/03/20 160 lb 6.4 oz (72.8 kg)  03/20/20 162 lb 0.6 oz (73.5 kg)    Physical Exam  [id number], in no acute distress  Neck: no JVD, carotid bruits, or masses Cardiac:RRR;  123456 systolic murmur LSB and apex Respiratory:  clear to auscultation bilaterally, normal work of breathing GI: soft, nontender, nondistended, + BS Ext: without cyanosis,  clubbing, or edema, Good distal pulses bilaterally Neuro:  Alert and Oriented x 3,  Psych: euthymic mood, full affect        EKGs/Labs/Other Test Reviewed:    EKG:  EKG is   ordered today.  The ekg ordered today demonstrates NSR with LBBB  Recent Labs: No  results found for requested labs within last 365 days.   Recent Lipid Panel No results for input(s): "CHOL", "TRIG", "HDL", "VLDL", "LDLCALC", "LDLDIRECT" in the last 8760 hours.   Prior CV Studies:    Echo 10/2020 IMPRESSIONS     1. Left ventricular ejection fraction, by estimation, is 55 to 60%. The  left ventricle has normal function. The left ventricle has no regional  wall motion abnormalities. There is mild left ventricular hypertrophy.  Left ventricular diastolic parameters  are consistent with Grade I diastolic dysfunction (impaired relaxation).   2. Right ventricular systolic function is normal. The right ventricular  size is normal. There is mildly elevated pulmonary artery systolic  pressure.   3. The mitral valve is abnormal. Trivial mitral valve regurgitation.  Moderate mitral annular calcification.   4. The aortic valve has been repaired/replaced. Aortic valve  regurgitation is not visualized. There is a Edwards 3300 TFX bioprosthetic  mechanical valve present in the aortic position. Procedure Date:  02/14/2013. EROA, by VTI measures 1.17 cm. Aortic  valve mean gradient measures 29.0 mmHg. DI is 0.26. AT is 200 msec. 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   5. The inferior vena cava is normal in size with greater than 50%  respiratory variability, suggesting right atrial pressure of 3 mmHg.   6. Cannot exclude small shunt by color doppler.   Comparison(s): Changes from prior study are noted. Prosthetic valve  gradients in 2016 (1.5 years post-op) were 30 and 58 mmHg (peak/mean,  respectively). Last echo on 08/07/2019, mean AOV gradient 21 mmHg, LVEF  55%.   FINDINGS   Left Ventricle: Left  ventricular ejection fraction, by estimation, is 55  to 60%. The left ventricle has normal function. The left ventricle has no  regional wall motion abnormalities. The left ventricular internal cavity  size was normal in size. There is   mild left ventricular hypertrophy. Left ventricular diastolic parameters  are consistent with Grade I diastolic dysfunction (impaired relaxation).  Indeterminate filling pressures.   Right Ventricle: The right ventricular size is normal. No increase in  right ventricular wall thickness. Right ventricular systolic function is  normal. There is mildly elevated pulmonary artery systolic pressure. The  tricuspid regurgitant velocity is 2.89   m/s, and with an assumed right atrial pressure of 3 mmHg, the estimated  right ventricular systolic pressure is [id number] mmHg.   Left Atrium: Left atrial size was normal in size.   Right Atrium: Right atrial size was normal in size.   Pericardium: There is no evidence of pericardial effusion.   Mitral Valve: The mitral valve is abnormal. There is mild thickening of  the mitral valve leaflet(s). There is mild calcification of the anterior  and posterior mitral valve leaflet(s). Moderate mitral annular  calcification. Trivial mitral valve  regurgitation.   Tricuspid Valve: The tricuspid valve is grossly normal. Tricuspid valve  regurgitation is mild.   Aortic Valve: The aortic valve has been repaired/replaced. Aortic valve  regurgitation is not visualized. Aortic valve mean gradient measures 29.0  mmHg. Aortic valve peak gradient measures 45.6 mmHg. Aortic valve area, by  VTI measures 1.17 cm. There is a   Edwards 3300 TFX bioprosthetic mechanical valve present in the aortic  position. Procedure Date: 02/14/2013.   Pulmonic Valve: The pulmonic valve was grossly normal. Pulmonic valve  regurgitation is mild.   Aorta: The aortic root and ascending aorta are structurally normal, with  no evidence of dilitation.    Venous: The inferior vena  cava is normal in size with greater than 50%  respiratory variability, suggesting right atrial pressure of 3 mmHg.   IAS/Shunts: Cannot exclude small shunt by color doppler.       Risk Assessment/Calculations/Metrics:              ASSESSMENT & PLAN:   No problem-specific Assessment & Plan notes found for this encounter.    CABG 02/14/13 with LIMA to LAD, SVG to OM 2, and SVG to diagonal - subsequent failed initial PCI with occluded SVG to OM - but then able to have DES to OM1 (prolonged and complicated PCI).  Still having chest pain, DOE, no energy. Very frustrated with symptoms since his surgery. He declines Evaluation with echo and NST.  > 40 min face to face time spent.    bicuspid aortic valve treated with pericardial tissue valve in 2014, with mild to mod AS on echo 123456  Diastolic CHF-managed by PCP with entresto, lasix,-recommend   HTN-controlled with amlodipine, entresto  HLD-LDL 106-hasn't been on lipid lowering agent. Has many reactions to meds and doesn't want to take anything at this time.   CKD with history of renal CA-Crt 0.77   Failure to thrive- Weight loss, fatigue, depression. F/u with PCP           Dispo:  Return in about 4 months (around 08/12/2022) for New patient follow with Dr.Skains.   Medication Adjustments/Labs and Tests Ordered: Current medicines are reviewed at length with the patient today.  Concerns regarding medicines are outlined above.  Tests Ordered: Orders Placed This Encounter  Procedures   EKG 12-Lead   Medication Changes: No orders of the defined types were placed in this encounter.  Sumner Boast, PA-C  04/13/2022 10:22 AM    [address], [place], [place]  [address] Phone: [phone]; Fax: [phone]

## 2022-04-13 ENCOUNTER — Ambulatory Visit: Payer: Federal, State, Local not specified - PPO | Attending: Physician Assistant | Admitting: Physician Assistant

## 2022-04-13 ENCOUNTER — Encounter: Payer: Self-pay | Admitting: Physician Assistant

## 2022-04-13 VITALS — BP 120/70 | HR 58 | Ht 66.0 in | Wt 144.8 lb

## 2022-04-13 DIAGNOSIS — Z952 Presence of prosthetic heart valve: Secondary | ICD-10-CM | POA: Diagnosis not present

## 2022-04-13 DIAGNOSIS — I10 Essential (primary) hypertension: Secondary | ICD-10-CM | POA: Diagnosis not present

## 2022-04-13 DIAGNOSIS — I25810 Atherosclerosis of coronary artery bypass graft(s) without angina pectoris: Secondary | ICD-10-CM | POA: Diagnosis not present

## 2022-04-13 DIAGNOSIS — I5032 Chronic diastolic (congestive) heart failure: Secondary | ICD-10-CM | POA: Diagnosis not present

## 2022-04-13 DIAGNOSIS — E782 Mixed hyperlipidemia: Secondary | ICD-10-CM

## 2022-04-13 DIAGNOSIS — R627 Adult failure to thrive: Secondary | ICD-10-CM

## 2022-04-13 NOTE — Patient Instructions (Signed)
Medication Instructions:   Your physician recommends that you continue on your current medications as directed. Please refer to the Current Medication list given to you today.   *If you need a refill on your cardiac medications before your next appointment, please call your pharmacy*   Lab Work:  None ordered.  If you have labs (blood work) drawn today and your tests are completely normal, you will receive your results only by: [place] (if you have MyChart) OR A paper copy in the mail If you have any lab test that is abnormal or we need to change your treatment, we will call you to review the results.   Testing/Procedures:  None ordere.   Follow-Up: At [facility], you and your health needs are our priority.  As part of our continuing mission to provide you with exceptional heart care, we have created designated Provider Care Teams.  These Care Teams include your primary Cardiologist (physician) and Advanced Practice Providers (APPs -  Physician Assistants and Nurse Practitioners) who all work together to provide you with the care you need, when you need it.  We recommend signing up for the patient portal called "MyChart".  Sign up information is provided on this After Visit Summary.  MyChart is used to connect with patients for Virtual Visits (Telemedicine).  Patients are able to view lab/test results, encounter notes, upcoming appointments, etc.  Non-urgent messages can be sent to your provider as well.   To learn more about what you can do with MyChart, go to [email or web address].    Your next appointment:   4 month(s)  Provider:   Candee Furbish, MD

## 2022-04-23 ENCOUNTER — Other Ambulatory Visit: Payer: Self-pay | Admitting: Adult Health

## 2022-04-23 DIAGNOSIS — G4700 Insomnia, unspecified: Secondary | ICD-10-CM

## 2022-04-23 NOTE — Telephone Encounter (Signed)
Due 3/20

## 2022-04-30 ENCOUNTER — Other Ambulatory Visit: Payer: Self-pay | Admitting: Adult Health

## 2022-04-30 DIAGNOSIS — G4700 Insomnia, unspecified: Secondary | ICD-10-CM

## 2022-05-04 ENCOUNTER — Other Ambulatory Visit: Payer: Self-pay | Admitting: Adult Health

## 2022-05-04 DIAGNOSIS — G4700 Insomnia, unspecified: Secondary | ICD-10-CM

## 2022-05-04 NOTE — Telephone Encounter (Signed)
Due 3/20 

## 2022-05-06 ENCOUNTER — Other Ambulatory Visit: Payer: Self-pay

## 2022-05-06 DIAGNOSIS — G4700 Insomnia, unspecified: Secondary | ICD-10-CM

## 2022-05-06 MED ORDER — ZOLPIDEM TARTRATE 10 MG PO TABS: 10.0000 mg | ORAL_TABLET | Freq: Every day | ORAL | 2 refills | Status: DC

## 2022-05-23 ENCOUNTER — Other Ambulatory Visit: Payer: Self-pay | Admitting: Adult Health

## 2022-05-23 DIAGNOSIS — F410 Panic disorder [episodic paroxysmal anxiety] without agoraphobia: Secondary | ICD-10-CM

## 2022-05-23 DIAGNOSIS — F411 Generalized anxiety disorder: Secondary | ICD-10-CM

## 2022-06-05 ENCOUNTER — Other Ambulatory Visit: Payer: Self-pay | Admitting: Adult Health

## 2022-06-05 DIAGNOSIS — F411 Generalized anxiety disorder: Secondary | ICD-10-CM

## 2022-06-05 DIAGNOSIS — F410 Panic disorder [episodic paroxysmal anxiety] without agoraphobia: Secondary | ICD-10-CM

## 2022-08-06 ENCOUNTER — Other Ambulatory Visit: Payer: Self-pay | Admitting: Adult Health

## 2022-08-06 DIAGNOSIS — G4700 Insomnia, unspecified: Secondary | ICD-10-CM

## 2022-08-10 ENCOUNTER — Encounter: Payer: Self-pay | Admitting: Cardiology

## 2022-08-10 ENCOUNTER — Ambulatory Visit: Payer: Federal, State, Local not specified - PPO | Attending: Cardiology | Admitting: Cardiology

## 2022-08-10 VITALS — BP 116/80 | HR 77 | Ht 66.0 in | Wt 144.6 lb

## 2022-08-10 DIAGNOSIS — Z952 Presence of prosthetic heart valve: Secondary | ICD-10-CM

## 2022-08-10 DIAGNOSIS — I25810 Atherosclerosis of coronary artery bypass graft(s) without angina pectoris: Secondary | ICD-10-CM | POA: Diagnosis not present

## 2022-08-10 DIAGNOSIS — I5032 Chronic diastolic (congestive) heart failure: Secondary | ICD-10-CM | POA: Diagnosis not present

## 2022-08-10 MED ORDER — ISOSORBIDE MONONITRATE ER 30 MG PO TB24: 30.0000 mg | ORAL_TABLET | Freq: Every day | ORAL | 3 refills | Status: DC

## 2022-08-10 NOTE — Patient Instructions (Signed)
Medication Instructions:  Please start Isosorbide 30 mg a day. Continue all other medications as listed.  *If you need a refill on your cardiac medications before your next appointment, please call your pharmacy*  Testing/Procedures: Your physician has requested that you have an echocardiogram. Echocardiography is a painless test that uses sound waves to create images of your heart. It provides your doctor with information about the size and shape of your heart and how well your heart's chambers and valves are working. This procedure takes approximately one hour. There are no restrictions for this procedure. Please do NOT wear cologne, perfume, aftershave, or lotions (deodorant is allowed). Please arrive 15 minutes prior to your appointment time.   Follow-Up: At [facility], you and your health needs are our priority.  As part of our continuing mission to provide you with exceptional heart care, we have created designated Provider Care Teams.  These Care Teams include your primary Cardiologist (physician) and Advanced Practice Providers (APPs -  Physician Assistants and Nurse Practitioners) who all work together to provide you with the care you need, when you need it.  We recommend signing up for the patient portal called "MyChart".  Sign up information is provided on this After Visit Summary.  MyChart is used to connect with patients for Virtual Visits (Telemedicine).  Patients are able to view lab/test results, encounter notes, upcoming appointments, etc.  Non-urgent messages can be sent to your provider as well.   To learn more about what you can do with MyChart, go to [email or web address].    Your next appointment:   2 month(s)  Provider:   Robin Searing, NP, Jacolyn Reedy, PA-C, Eligha Bridegroom, NP, or Tereso Newcomer, PA-C

## 2022-08-10 NOTE — Progress Notes (Signed)
Cardiology Office Note:    Date:  08/10/2022   ID:  Max Byrd, DOB 1955/12/27, MRN [id number]  PCP:  Georgann Housekeeper, MD   [facility] Cardiologist:  Donato Schultz, MD     Referring MD: Georgann Housekeeper, MD    History of Present Illness:    Max Byrd is a 67 y.o. male here for follow-up of BOVINE aortic valve replacement in 2014 with CABG LIMA to LAD SVG to OM 2 and SVG to diagonal with subsequent failed PCI to occluded SVG to OM but eventually able to have DES to OM1 prolonged and complicated PCI.  Former patient of Dr. Verdis Prime.  Also has hypertension Chronic kidney disease Depression Renal cell carcinoma Chronic back pain Mild to moderate aortic stenosis Left bundle branch block  At prior visit in February 2024 his wife noted that he had been declining rapidly.  She noted that they were taking his testosterone away and he was losing weight.  Occasional sharp needlelike pain dull throbbing air bubble type pain.  Gets shortness of breath when doing laundry or taking trash out.  They felt as though the CABG and valve repair did not help in 2014. Has neuropathy in fingers and toes. After shower fingers turn white. Hurting all day with angina since June 2024. Does not like NTG feeling. Nausea daily. 177 in 2014 to 144  Hard to fish, cars.   Past Medical History:  Diagnosis Date   Anginal pain (HCC) 2015   "since valve was done"   Anxiety    Arthritis    "left pointer finger; forminal openings bilaterally" (08/10/2013)   Bicuspid aortic valve    a. 01/2013 s/p AVR - 21mm Edwards 3300 TFX pericardial tissue valve, ser # [id number].   CKD (chronic kidney disease) stage 2, GFR 60-89 ml/min    Coronary atherosclerosis of native coronary artery    a. 01/2013 CABGx3: LIMA->LAD, VG->Diag, VG->OM (performed @ time of AVR),  occluded SVG-OM2 , now with 95% stenosis at the anastomosis site on OM 2 - PCI attempt 07/28/13 unsuccessful - med Rx cont'd    Depression     Erectile dysfunction    INJECTIONS OF PROSTAGLANDIN BY UROLGIST   Essential hypertension, benign    GERD (gastroesophageal reflux disease)    H/O Bell's palsy 2010 X2   Heart murmur    History of blood transfusion    "related to stomach bleeding from ASA & NSAIDS   History of stomach ulcers    Hyperlipidemia    IBS (irritable bowel syndrome)    Migraine    "went away in the 1990's"   Pain, chronic postoperative    S/P LEFT ARM SURGERY   Renal cell carcinoma of right kidney (HCC) 2003    Past Surgical History:  Procedure Laterality Date   ANAL SPHINCTEROTOMY  04/13/03   DR. GRAPEY   AORTIC VALVE REPLACEMENT N/A 02/14/2013   Procedure: AORTIC VALVE REPLACEMENT (AVR);  Surgeon: Kerin Perna, MD;  Location: [facility] OR;  Service: Open Heart Surgery;  Laterality: N/A;   BUNIONECTOMY     right  02/2009   CARDIAC CATHETERIZATION  2010; 6/12015; 07/29/2013   CARDIAC VALVE REPLACEMENT     COLONOSCOPY  2008   DIVERTICULOSIS   CORONARY ANGIOPLASTY WITH STENT PLACEMENT  08/10/2013   "1"   CORONARY ARTERY BYPASS GRAFT N/A 02/14/2013   Procedure: CORONARY ARTERY BYPASS GRAFTING (CABG);  Surgeon: Kerin Perna, MD;  Location[facility] OR;  Service: Open Heart Surgery;  Laterality: N/A;   ELBOW SURGERY Left X 2   ELBOW SURGERY Right X 2   ELBOW SURGERY     left  2009   EXCISIONAL HEMORRHOIDECTOMY     HERNIA REPAIR Left 1957   HERNIA REPAIR     06/21/17   INTRAOPERATIVE TRANSESOPHAGEAL ECHOCARDIOGRAM N/A 02/14/2013   Procedure: INTRAOPERATIVE TRANSESOPHAGEAL ECHOCARDIOGRAM;  Surgeon: Kerin Perna, MD;  Location: [facility] OR;  Service: Open Heart Surgery;  Laterality: N/A;   LATERAL EPICONDYLE RELEASE Left 07/29/07   DR. GRAVES   LEFT AND RIGHT HEART CATHETERIZATION WITH CORONARY ANGIOGRAM N/A 01/27/2013   Procedure: LEFT AND RIGHT HEART CATHETERIZATION WITH CORONARY ANGIOGRAM;  Surgeon: Lesleigh Noe, MD;  Location: [facility] CATH LAB;  Service: Cardiovascular;  Laterality: N/A;   LEFT HEART  CATHETERIZATION WITH CORONARY/GRAFT ANGIOGRAM N/A 07/28/2013   Procedure: LEFT HEART CATHETERIZATION WITH Isabel Caprice;  Surgeon: Marykay Lex, MD;  Location: [facility] CATH LAB;  Service: Cardiovascular;  Laterality: N/A;   PARTIAL NEPHRECTOMY  08/24/02   RIGHT...DR. Isabel Caprice   PERCUTANEOUS STENT INTERVENTION N/A 08/10/2013   Procedure: PERCUTANEOUS STENT INTERVENTION;  Surgeon: Lesleigh Noe, MD;  Location[facility] C[facility] LAB;  Service: Cardiovascular;  Laterality: N/A;   TOE SURGERY Right    "shaved; wired back straight"   TONSILLECTOMY      Current Medications: Current Meds  Medication Sig   ALPRAZolam (XANAX) 1 MG tablet Take 1 tablet (1 mg total) by mouth 4 (four) times daily.   amLODipine (NORVASC) 5 MG tablet Take 1 tablet (5 mg total) by mouth daily.   carisoprodol (SOMA) 350 MG tablet Take 175 mg by mouth 2 (two) times daily.   cholestyramine (QUESTRAN) 4 g packet Take 1 packet by mouth daily.   clindamycin (CLEOCIN) 300 MG capsule TAKE 2 CAPSULES 1 HOUR PRIOR TO DENTAL APPOINTMENT.   Diclofenac-miSOPROStol 50-0.2 MG TBEC Take 1 tablet by mouth 2 (two) times daily.   DULoxetine (CYMBALTA) 60 MG capsule Take 2 capsules (120 mg total) by mouth daily.   ENTRESTO 24-26 MG Take 1 tablet by mouth 2 (two) times daily.   esomeprazole (NEXIUM) 20 MG packet Take 20 mg by mouth daily.   furosemide (LASIX) 40 MG tablet Take 40 mg by mouth 2 (two) times daily.   hyoscyamine (LEVSIN SL) 0.125 MG SL tablet Take 0.25 mg by mouth every 6 (six) hours as needed (IBS).    isosorbide mononitrate (IMDUR) 30 MG 24 hr tablet Take 1 tablet (30 mg total) by mouth daily.   morphine (MS CONTIN) 15 MG 12 hr tablet Take 15 mg by mouth 2 (two) times daily as needed.   nitroGLYCERIN (NITROSTAT) 0.4 MG SL tablet Place 1 tablet (0.4 mg total) under the tongue every 5 (five) minutes as needed for chest pain (MAX 3 TABLETS).   oxyCODONE-acetaminophen (PERCOCET) 10-325 MG tablet Take 1 tablet by mouth every 8  (eight) hours.   sertraline (ZOLOFT) 100 MG tablet Take 2 tablets (200 mg total) by mouth daily.   testosterone cypionate (DEPOTESTOSTERONE CYPIONATE) 200 MG/ML injection    zolpidem (AMBIEN) 10 MG tablet Take 1 tablet (10 mg total) by mouth at bedtime.     Allergies:   Aspirin, Nsaids, Tolmetin, Zoloft [sertraline hcl], Klonopin [clonazepam], Beta adrenergic blockers, Penicillins, and Reglan [metoclopramide]   Social History   Socioeconomic History   Marital status: Married    Spouse name: Not on file   Number of children: Not on file   Years of education: Not on file  Highest education level: Not on file  Occupational History   Not on file  Tobacco Use   Smoking status: Former    Packs/day: 1.50    Years: 6.00    Additional pack years: 0.00    Total pack years: 9.00    Types: Cigarettes    Start date: 10/06/1969    Quit date: 01/21/1976    Years since quitting: 46.5   Smokeless tobacco: Current    Types: Snuff   Tobacco comments:    quit  36 years ago  Vaping Use   Vaping Use: Never used  Substance and Sexual Activity   Alcohol use: Yes    Alcohol/week: 0.0 standard drinks of alcohol    Comment: "last beer was 01/2013"   Drug use: No   Sexual activity: Yes  Other Topics Concern   Not on file  Social History Narrative   Was working out regularly prior to CABG/AVR.  Hunts/fishes.   Social Determinants of Health   Financial Resource Strain: Not on file  Food Insecurity: Not on file  Transportation Needs: Not on file  Physical Activity: Not on file  Stress: Not on file  Social Connections: Not on file     Family History: The patient's family history includes Cancer in his mother; Heart disease in his father.  ROS:   Please see the history of present illness.     All other systems reviewed and are negative.  EKGs/Labs/Other Studies Reviewed:    The following studies were reviewed today: Echo 2022 - EF 60% mild LVH grade 1 diastolic dysfunction, Edwards  valve aortic position mean gradient 29 mmHg DI 0.26.  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    Recent Labs: No results found for requested labs within last 365 days.  Recent Lipid Panel No results found for: "CHOL", "TRIG", "HDL", "CHOLHDL", "VLDL", "LDLCALC", "LDLDIRECT"   Risk Assessment/Calculations:               Physical Exam:    VS:  BP 116/80   Pulse 77   Ht 5\' 6"  (1.676 m)   Wt 144 lb 9.6 oz (65.6 kg)   SpO2 97%   BMI 23.34 kg/m     Wt Readings from Last 3 Encounters:  08/10/22 144 lb 9.6 oz (65.6 kg)  04/13/22 144 lb 12.8 oz (65.7 kg)  10/03/20 160 lb 6.4 oz (72.8 kg)     GEN:  Well nourished, well developed in no acute distress HEENT: Normal NECK: No JVD; No carotid bruits LYMPHATICS: No lymphadenopathy CARDIAC: CABG SCAR RRR, no murmurs, rubs, gallops RESPIRATORY:  Clear to auscultation without rales, wheezing or rhonchi  ABDOMEN: Soft, non-tender, non-distended MUSCULOSKELETAL:  No edema; No deformity  SKIN: Warm and dry NEUROLOGIC:  Alert and oriented x 3 PSYCHIATRIC:  Normal affect   ASSESSMENT:    1. Coronary artery disease involving coronary bypass graft of native heart without angina pectoris   2. S/P AVR (aortic valve replacement)   3. Chronic diastolic CHF (congestive heart failure) (HCC)    PLAN:    In order of problems listed above:  Coronary artery disease status post CABG 01/2013 - Subsequent PCI to obtuse marginal branch 2015.  Has not felt well post CABG. 2/3 grafts down. -Frustrated with his energy level.  Still short of breath with minimal activity.  NYHA class II-III symptoms.  Likely a component of diastolic heart failure as well. - We will go ahead and place him on isosorbide 30 mg a day and  see how he feels.  Warned him about potential headache.  Can always cut this dose in half if necessary. - Next step would be to potentially add Jardiance for diastolic heart failure. - We will also check an echocardiogram.   Analyze aortic valve.  If gradient is more significant, consider structural team evaluation. -Goal-directed medical therapy.   Bovine aortic valve replacement 2014 secondary to bicuspid aortic valve - Mean gradient 30 mmHg, moderate level stenosis              Medication Adjustments/Labs and Tests Ordered: Current medicines are reviewed at length with the patient today.  Concerns regarding medicines are outlined above.  Orders Placed This Encounter  Procedures   ECHOCARDIOGRAM COMPLETE   Meds ordered this encounter  Medications   isosorbide mononitrate (IMDUR) 30 MG 24 hr tablet    Sig: Take 1 tablet (30 mg total) by mouth daily.    Dispense:  90 tablet    Refill:  3    Patient Instructions  Medication Instructions:  Please start Isosorbide 30 mg a day. Continue all other medications as listed.  *If you need a refill on your cardiac medications before your next appointment, please call your pharmacy*  Testing/Procedures: Your physician has requested that you have an echocardiogram. Echocardiography is a painless test that uses sound waves to create images of your heart. It provides your doctor with information about the size and shape of your heart and how well your heart's chambers and valves are working. This procedure takes approximately one hour. There are no restrictions for this procedure. Please do NOT wear cologne, perfume, aftershave, or lotions (deodorant is allowed). Please arrive 15 minutes prior to your appointment time.   Follow-Up: At [facility], you and your health needs are our priority.  As part of our continuing mission to provide you with exceptional heart care, we have created designated Provider Care Teams.  These Care Teams include your primary Cardiologist (physician) and Advanced Practice Providers (APPs -  Physician Assistants and Nurse Practitioners) who all work together to provide you with the care you need, when you need it.  We  recommend signing up for the patient portal called "MyChart".  Sign up information is provided on this After Visit Summary.  MyChart is used to connect with patients for Virtual Visits (Telemedicine).  Patients are able to view lab/test results, encounter notes, upcoming appointments, etc.  Non-urgent messages can be sent to your provider as well.   To learn more about what you can do with MyChart, go to [email or web address].    Your next appointment:   2 month(s)  Provider:   Robin Searing, NP, Jacolyn Reedy, PA-C, Eligha Bridegroom, NP, or Tereso Newcomer, PA-C            Signed, Donato Schultz, MD  08/10/2022 9:45 AM    [facility]

## 2022-08-16 ENCOUNTER — Other Ambulatory Visit: Payer: Self-pay | Admitting: Adult Health

## 2022-08-16 DIAGNOSIS — F410 Panic disorder [episodic paroxysmal anxiety] without agoraphobia: Secondary | ICD-10-CM

## 2022-08-16 DIAGNOSIS — F411 Generalized anxiety disorder: Secondary | ICD-10-CM

## 2022-08-28 ENCOUNTER — Other Ambulatory Visit: Payer: Self-pay | Admitting: Adult Health

## 2022-08-28 DIAGNOSIS — F410 Panic disorder [episodic paroxysmal anxiety] without agoraphobia: Secondary | ICD-10-CM

## 2022-08-28 DIAGNOSIS — F411 Generalized anxiety disorder: Secondary | ICD-10-CM

## 2022-08-31 ENCOUNTER — Other Ambulatory Visit: Payer: Self-pay | Admitting: Adult Health

## 2022-08-31 DIAGNOSIS — G4700 Insomnia, unspecified: Secondary | ICD-10-CM

## 2022-09-01 NOTE — Telephone Encounter (Signed)
Due 7/18

## 2022-09-03 ENCOUNTER — Other Ambulatory Visit: Payer: Self-pay | Admitting: Adult Health

## 2022-09-03 DIAGNOSIS — G4700 Insomnia, unspecified: Secondary | ICD-10-CM

## 2022-09-08 ENCOUNTER — Ambulatory Visit (HOSPITAL_COMMUNITY): Payer: Federal, State, Local not specified - PPO | Attending: Cardiology

## 2022-09-08 DIAGNOSIS — Z952 Presence of prosthetic heart valve: Secondary | ICD-10-CM | POA: Insufficient documentation

## 2022-09-08 DIAGNOSIS — I5032 Chronic diastolic (congestive) heart failure: Secondary | ICD-10-CM | POA: Diagnosis not present

## 2022-09-08 LAB — ECHOCARDIOGRAM COMPLETE
AR max vel: 1.18 cm2
AV Area VTI: 1.21 cm2
AV Area mean vel: 1.21 cm2
AV Mean grad: 25 mmHg
AV Peak grad: 41.9 mmHg
Ao pk vel: 3.24 m/s
Area-P 1/2: 2.48 cm2
S' Lateral: 3.15 cm

## 2022-09-09 ENCOUNTER — Encounter: Payer: Self-pay | Admitting: Cardiology

## 2022-09-10 ENCOUNTER — Ambulatory Visit (INDEPENDENT_AMBULATORY_CARE_PROVIDER_SITE_OTHER): Payer: Federal, State, Local not specified - PPO | Admitting: Adult Health

## 2022-09-10 ENCOUNTER — Encounter: Payer: Self-pay | Admitting: Adult Health

## 2022-09-10 DIAGNOSIS — F411 Generalized anxiety disorder: Secondary | ICD-10-CM

## 2022-09-10 DIAGNOSIS — F331 Major depressive disorder, recurrent, moderate: Secondary | ICD-10-CM | POA: Diagnosis not present

## 2022-09-10 DIAGNOSIS — G4700 Insomnia, unspecified: Secondary | ICD-10-CM

## 2022-09-10 DIAGNOSIS — F410 Panic disorder [episodic paroxysmal anxiety] without agoraphobia: Secondary | ICD-10-CM | POA: Diagnosis not present

## 2022-09-10 MED ORDER — ALPRAZOLAM 1 MG PO TABS: 1.0000 mg | ORAL_TABLET | Freq: Four times a day (QID) | ORAL | 2 refills | Status: DC

## 2022-09-10 MED ORDER — DULOXETINE HCL 60 MG PO CPEP: 120.0000 mg | ORAL_CAPSULE | Freq: Every day | ORAL | 5 refills | Status: DC

## 2022-09-10 MED ORDER — ZOLPIDEM TARTRATE 10 MG PO TABS: 10.0000 mg | ORAL_TABLET | Freq: Every day | ORAL | 2 refills | Status: DC

## 2022-09-10 MED ORDER — SERTRALINE HCL 100 MG PO TABS: 200.0000 mg | ORAL_TABLET | Freq: Every day | ORAL | 5 refills | Status: DC

## 2022-09-10 NOTE — Progress Notes (Signed)
Max Byrd [id number] 1955/10/08 67 y.o.  Subjective:   Patient ID:  Max Byrd is a 67 y.o. (DOB [birth year]) male.  Chief Complaint: No chief complaint on file.   HPI MESHILEM MACHUCA presents to the office today for follow-up of GAD, MDD, insomnia, and panic attacks.   Describes mood today as "not too good". Pleasant. Tearful at times. Mood symptoms - reports depression, anxiety, and irritability. Denies panic attacks. Reports worry, rumination, and over thinking. Mood is variable. Stating "I'm taking it a day at a time". Feels like medications are helpful. Reports his health continues to decline with multiple medical issues. Varying interest and motivation. Taking medications as prescribed. Energy levels lower. Active, does not have a regular exercise routine. Enjoys some usual interests. Married. Lives with wife.  Appetite adequate. Weight gain - 135 to 137 pounds.  Sleeps well most nights with Ambien. Averages 8 hours.  Focus and concentration difficulties - reports memory loss. Completing tasks. Managing some aspects of household. Disabled - health issues.  Denies SI or HI.  Denies AH or VH. Denies substance use. Denies self harm.   Flowsheet Row Admission (Discharged) from 09/06/2017 in [facility]  C-SSRS RISK CATEGORY Low Risk        Review of Systems:  Review of Systems  Musculoskeletal:  Negative for gait problem.  Neurological:  Negative for tremors.  Psychiatric/Behavioral:         Please refer to HPI    Medications: I have reviewed the patient's current medications.  Current Outpatient Medications  Medication Sig Dispense Refill   ALPRAZolam (XANAX) 1 MG tablet Take 1 tablet (1 mg total) by mouth 4 (four) times daily. 120 tablet 2   amLODipine (NORVASC) 5 MG tablet Take 1 tablet (5 mg total) by mouth daily. 90 tablet 3   carisoprodol (SOMA) 350 MG tablet Take 175 mg by mouth 2 (two) times daily.     cholestyramine (QUESTRAN) 4 g  packet Take 1 packet by mouth daily.     clindamycin (CLEOCIN) 300 MG capsule TAKE 2 CAPSULES 1 HOUR PRIOR TO DENTAL APPOINTMENT.     Diclofenac-miSOPROStol 50-0.2 MG TBEC Take 1 tablet by mouth 2 (two) times daily.     DULoxetine (CYMBALTA) 60 MG capsule Take 2 capsules (120 mg total) by mouth daily. 60 capsule 5   ENTRESTO 24-26 MG Take 1 tablet by mouth 2 (two) times daily.     esomeprazole (NEXIUM) 20 MG packet Take 20 mg by mouth daily.     furosemide (LASIX) 40 MG tablet Take 40 mg by mouth 2 (two) times daily.     hyoscyamine (LEVSIN SL) 0.125 MG SL tablet Take 0.25 mg by mouth every 6 (six) hours as needed (IBS).      isosorbide mononitrate (IMDUR) 30 MG 24 hr tablet Take 1 tablet (30 mg total) by mouth daily. 90 tablet 3   morphine (MS CONTIN) 15 MG 12 hr tablet Take 15 mg by mouth 2 (two) times daily as needed.     nitroGLYCERIN (NITROSTAT) 0.4 MG SL tablet Place 1 tablet (0.4 mg total) under the tongue every 5 (five) minutes as needed for chest pain (MAX 3 TABLETS). 25 tablet 7   oxyCODONE-acetaminophen (PERCOCET) 10-325 MG tablet Take 1 tablet by mouth every 8 (eight) hours.     sertraline (ZOLOFT) 100 MG tablet Take 2 tablets (200 mg total) by mouth daily. 60 tablet 5   testosterone cypionate (DEPOTESTOSTERONE CYPIONATE) 200 MG/ML  injection   3   zolpidem (AMBIEN) 10 MG tablet TAKE ONE TABLET BY MOUTH AT BEDTIME 30 tablet 2   No current facility-administered medications for this visit.    Medication Side Effects: None  Allergies:  Allergies  Allergen Reactions   Aspirin Other (See Comments)    H/O BLEEDING ULCER   Nsaids Other (See Comments)    Stomach bleeding   Tolmetin Other (See Comments)    Stomach bleeding   Zoloft [Sertraline Hcl] Other (See Comments)    FELT TERRIBLE   Klonopin [Clonazepam] Other (See Comments)    Erectile dysfunction   Beta Adrenergic Blockers Nausea Only    Metoprolol   Penicillins Nausea And Vomiting and Other (See Comments)    Has patient  had a PCN reaction causing immediate rash, facial/tongue/throat swelling, SOB or lightheadedness with hypotension: No Has patient had a PCN reaction causing severe rash involving mucus membranes or skin necrosis: No Has patient had a PCN reaction that required hospitalization: No Has patient had a PCN reaction occurring within the last 10 years: No If all of the above answers are "NO", then may proceed with Cephalosporin use.    Reglan [Metoclopramide] Anxiety    EXCITABILITY     Past Medical History:  Diagnosis Date   Anginal pain (HCC) 2015   "since valve was done"   Anxiety    Arthritis    "left pointer finger; forminal openings bilaterally" (08/10/2013)   Bicuspid aortic valve    a. 01/2013 s/p AVR - 21mm Edwards 3300 TFX pericardial tissue valve, ser # [id number].   CKD (chronic kidney disease) stage 2, GFR 60-89 ml/min    Coronary atherosclerosis of native coronary artery    a. 01/2013 CABGx3: LIMA->LAD, VG->Diag, VG->OM (performed @ time of AVR),  occluded SVG-OM2 , now with 95% stenosis at the anastomosis site on OM 2 - PCI attempt 07/28/13 unsuccessful - med Rx cont'd    Depression    Erectile dysfunction    INJECTIONS OF PROSTAGLANDIN BY UROLGIST   Essential hypertension, benign    GERD (gastroesophageal reflux disease)    H/O Bell's palsy 2010 X2   Heart murmur    History of blood transfusion    "related to stomach bleeding from ASA & NSAIDS   History of stomach ulcers    Hyperlipidemia    IBS (irritable bowel syndrome)    Migraine    "went away in the 1990's"   Pain, chronic postoperative    S/P LEFT ARM SURGERY   Renal cell carcinoma of right kidney (HCC) 2003    Past Medical History, Surgical history, Social history, and Family history were reviewed and updated as appropriate.   Please see review of systems for further details on the patient's review from today.   Objective:   Physical Exam:  There were no vitals taken for this visit.  Physical  Exam Constitutional:      General: He is not in acute distress. Musculoskeletal:        General: No deformity.  Neurological:     Mental Status: He is alert and oriented to person, place, and time.     Coordination: Coordination normal.  Psychiatric:        Attention and Perception: Attention and perception normal. He does not perceive auditory or visual hallucinations.        Mood and Affect: Affect is not labile, blunt, angry or inappropriate.        Speech: Speech normal.  Behavior: Behavior normal.        Thought Content: Thought content normal. Thought content is not paranoid or delusional. Thought content does not include homicidal or suicidal ideation. Thought content does not include homicidal or suicidal plan.        Cognition and Memory: Cognition and memory normal.        Judgment: Judgment normal.     Comments: Insight intact     Lab Review:     Component Value Date/Time   NA 140 09/01/2017 1018   NA 139 03/08/2014 1230   K 4.0 09/01/2017 1018   K 4.2 03/08/2014 1230   CL 105 09/01/2017 1018   CL 101 03/08/2014 1230   CO2 26 09/01/2017 1018   CO2 30 03/08/2014 1230   GLUCOSE 78 09/01/2017 1018   GLUCOSE 87 03/08/2014 1230   BUN 27 (H) 09/01/2017 1018   BUN 27 (H) 03/08/2014 1230   CREATININE 1.03 09/01/2017 1018   CREATININE 1.4 (H) 03/08/2014 1230   CALCIUM 8.9 09/01/2017 1018   CALCIUM 8.8 03/08/2014 1230   PROT 5.9 (L) 03/08/2014 1230   ALBUMIN 3.6 03/08/2014 1230   AST 24 03/08/2014 1230   ALT 23 03/08/2014 1230   ALKPHOS 51 03/08/2014 1230   BILITOT 1.00 03/08/2014 1230   GFRNONAA >60 09/01/2017 1018   GFRAA >60 09/01/2017 1018       Component Value Date/Time   WBC 7.3 09/01/2017 1018   RBC 4.87 09/01/2017 1018   HGB 16.4 09/01/2017 1018   HGB 13.7 07/03/2015 0818   HCT 46.4 09/01/2017 1018   HCT 38.3 (L) 07/03/2015 0818   PLT 129 (L) 09/01/2017 1018   PLT 146 07/03/2015 0818   MCV 95.3 09/01/2017 1018   MCV 88 07/03/2015 0818   MCH  33.7 09/01/2017 1018   MCHC 35.3 09/01/2017 1018   RDW 11.8 09/01/2017 1018   RDW 11.9 07/03/2015 0818   LYMPHSABS 1.0 09/01/2017 1018   LYMPHSABS 1.0 07/03/2015 0818   MONOABS 0.4 09/01/2017 1018   EOSABS 0.2 09/01/2017 1018   EOSABS 0.1 07/03/2015 0818   BASOSABS 0.0 09/01/2017 1018   BASOSABS 0.0 07/03/2015 0818    No results found for: "POCLITH", "LITHIUM"   No results found for: "PHENYTOIN", "PHENOBARB", "VALPROATE", "CBMZ"   .res Assessment: Plan:    Plan:  PDMP reviewed  1. Ambien 10mg  at hs 2. Xanax 1mg  4 x daily 3. Zoloft 100mg  2 daily 4. Cymbalta 60mg  BID   RTC 6 months   Patient advised to contact office with any questions, adverse effects, or acute worsening in signs and symptoms.  Discussed potential benefits, risk, and side effects of benzodiazepines to include potential risk of tolerance and dependence, as well as possible drowsiness. Advised patient not to drive if experiencing drowsiness and to take lowest possible effective dose to minimize risk of dependence and tolerance.  There are no diagnoses linked to this encounter.   Please see After Visit Summary for patient specific instructions.  Future Appointments  Date Time Provider Department Center  09/10/2022  8:00 AM Jessiah Steinhart, Thereasa Solo, NP [facility] None  10/14/2022  8:00 AM Louanne Skye Devoria Albe., NP [id number]    No orders of the defined types were placed in this encounter.   -------------------------------

## 2022-10-12 NOTE — Progress Notes (Unsigned)
Office Visit    Patient Name: Max Byrd Date of Encounter: 10/12/2022  Primary Care Provider:  Georgann Housekeeper, MD Primary Cardiologist:  Max Schultz, MD Primary Electrophysiologist: None   Past Medical History    Past Medical History:  Diagnosis Date   Anginal pain (HCC) 2015   "since valve was done"   Anxiety    Arthritis    "left pointer finger; forminal openings bilaterally" (08/10/2013)   Bicuspid aortic valve    a. 01/2013 s/p AVR - 21mm Edwards 3300 TFX pericardial tissue valve, ser # [id number].   CKD (chronic kidney disease) stage 2, GFR 60-89 ml/min    Coronary atherosclerosis of native coronary artery    a. 01/2013 CABGx3: LIMA->LAD, VG->Diag, VG->OM (performed @ time of AVR),  occluded SVG-OM2 , now with 95% stenosis at the anastomosis site on OM 2 - PCI attempt 07/28/13 unsuccessful - med Rx cont'd    Depression    Erectile dysfunction    INJECTIONS OF PROSTAGLANDIN BY UROLGIST   Essential hypertension, benign    GERD (gastroesophageal reflux disease)    H/O Bell's palsy 2010 X2   Heart murmur    History of blood transfusion    "related to stomach bleeding from ASA & NSAIDS   History of stomach ulcers    Hyperlipidemia    IBS (irritable bowel syndrome)    Migraine    "went away in the 1990's"   Pain, chronic postoperative    S/P LEFT ARM SURGERY   Renal cell carcinoma of right kidney (HCC) 2003   Past Surgical History:  Procedure Laterality Date   ANAL SPHINCTEROTOMY  04/13/03   Max Byrd   AORTIC VALVE REPLACEMENT N/A 02/14/2013   Procedure: AORTIC VALVE REPLACEMENT (AVR);  Surgeon: Max Perna, MD;  Location: [facility] OR;  Service: Open Heart Surgery;  Laterality: N/A;   BUNIONECTOMY     right  02/2009   CARDIAC CATHETERIZATION  2010; 6/12015; 07/29/2013   CARDIAC VALVE REPLACEMENT     COLONOSCOPY  2008   DIVERTICULOSIS   CORONARY ANGIOPLASTY WITH STENT PLACEMENT  08/10/2013   "1"   CORONARY ARTERY BYPASS GRAFT N/A 02/14/2013   Procedure: CORONARY  ARTERY BYPASS GRAFTING (CABG);  Surgeon: Max Perna, MD;  Location: [facility] OR;  Service: Open Heart Surgery;  Laterality: N/A;   ELBOW SURGERY Left X 2   ELBOW SURGERY Right X 2   ELBOW SURGERY     left  2009   EXCISIONAL HEMORRHOIDECTOMY     HERNIA REPAIR Left 1957   HERNIA REPAIR     06/21/17   INTRAOPERATIVE TRANSESOPHAGEAL ECHOCARDIOGRAM N/A 02/14/2013   Procedure: INTRAOPERATIVE TRANSESOPHAGEAL ECHOCARDIOGRAM;  Surgeon: Max Perna, MD;  Location: [facility] OR;  Service: Open Heart Surgery;  Laterality: N/A;   LATERAL EPICONDYLE RELEASE Left 07/29/07   Max Byrd   LEFT AND RIGHT HEART CATHETERIZATION WITH CORONARY ANGIOGRAM N/A 01/27/2013   Procedure: LEFT AND RIGHT HEART CATHETERIZATION WITH CORONARY ANGIOGRAM;  Surgeon: Max Noe, MD;  Location: [facility] CATH LAB;  Service: Cardiovascular;  Laterality: N/A;   LEFT HEART CATHETERIZATION WITH CORONARY/GRAFT ANGIOGRAM N/A 07/28/2013   Procedure: LEFT HEART CATHETERIZATION WITH Max Byrd;  Surgeon: Max Lex, MD;  Location: [facility] CATH LAB;  Service: Cardiovascular;  Laterality: N/A;   PARTIAL NEPHRECTOMY  08/24/02   RIGHT...DR. Isabel Byrd   PERCUTANEOUS STENT INTERVENTION N/A 08/10/2013   Procedure: PERCUTANEOUS STENT INTERVENTION;  Surgeon: Max Noe, MD;  Location: [facility] CATH LAB;  Service:  Cardiovascular;  Laterality: N/A;   TOE SURGERY Right    "shaved; wired back straight"   TONSILLECTOMY      Allergies  Allergies  Allergen Reactions   Aspirin Other (See Comments)    H/O BLEEDING ULCER   Nsaids Other (See Comments)    Stomach bleeding   Tolmetin Other (See Comments)    Stomach bleeding   Zoloft [Sertraline Hcl] Other (See Comments)    FELT TERRIBLE   Klonopin [Clonazepam] Other (See Comments)    Erectile dysfunction   Beta Adrenergic Blockers Nausea Only    Metoprolol   Penicillins Nausea And Vomiting and Other (See Comments)    Has patient had a PCN reaction causing immediate rash, facial/tongue/throat  swelling, SOB or lightheadedness with hypotension: No Has patient had a PCN reaction causing severe rash involving mucus membranes or skin necrosis: No Has patient had a PCN reaction that required hospitalization: No Has patient had a PCN reaction occurring within the last 10 years: No If all of the above answers are "NO", then may proceed with Cephalosporin use.    Reglan [Metoclopramide] Anxiety    EXCITABILITY      History of Present Illness    Max Byrd  is a 67 year old male with a PMH of CAD s/p CABG x 3 and AVR repair 2014 PCI to OM1 07/2013, HTN, HLD, LBBB renal cancer, CKD, depression who presents today for 63-month follow-up.  Max Byrd is a former patient of Max Byrd and is now currently followed by Max Byrd.  He underwent CABG x 3 with AVR repair due to bicuspid AV valve in 2014 with subsequent complicated PCI to OM1 in 2015.  2D echo completed 11/2016 with EF of 55 to 60% grade 2 DD.  He was seen by Max Byrd on 08/06/2022 to establish care. He reported occasional sharp needlelike pain shortness of breath with activities.  He reports not feeling well since CABG and AVR repair in 2014.  He was started on isosorbide. 2D echo was repeated showing stable bioprosthetic AVR with mean gradient of  25 and peak gradient of 41.9   EF of 55 to 60%.  Discussion was made regarding potential adding SGLT2 inhibitor to current regimen due to diastolic dysfunction.  Since last being seen in the office patient reports that he is feeling some improvement with his chest pain since beginning isosorbide.  During today's visit he had reports of chest discomfort and EKG was completed and showed no acute abnormalities.  He continues to have significant shortness of breath with activity and decreased energy levels.  He also notes what sounds similar to Max Byrd with numbness and tingling in his fingers and toes.  He also describes bluish-white discoloration to his extremities when this occurs.  He reports  the symptoms occur primarily when he is coming out of the shower or with temperature changes.  During today's visit we also discussed the addition of SGLT2 inhibitor in the future and also reviewed the pathophysiology of aortic disease.  Patient denies chest pain, palpitations, dyspnea, PND, orthopnea, nausea, vomiting, dizziness, syncope, edema, weight gain, or early satiety.   Home Medications    Current Outpatient Medications  Medication Sig Dispense Refill   ALPRAZolam (XANAX) 1 MG tablet Take 1 tablet (1 mg total) by mouth 4 (four) times daily. 120 tablet 2   amLODipine (NORVASC) 5 MG tablet Take 1 tablet (5 mg total) by mouth daily. 90 tablet 3   carisoprodol (SOMA) 350 MG tablet Take 175  mg by mouth 2 (two) times daily.     cholestyramine (QUESTRAN) 4 g packet Take 1 packet by mouth daily.     clindamycin (CLEOCIN) 300 MG capsule TAKE 2 CAPSULES 1 HOUR PRIOR TO DENTAL APPOINTMENT.     Diclofenac-miSOPROStol 50-0.2 MG TBEC Take 1 tablet by mouth 2 (two) times daily.     DULoxetine (CYMBALTA) 60 MG capsule Take 2 capsules (120 mg total) by mouth daily. 60 capsule 5   ENTRESTO 24-26 MG Take 1 tablet by mouth 2 (two) times daily.     esomeprazole (NEXIUM) 20 MG packet Take 20 mg by mouth daily.     furosemide (LASIX) 40 MG tablet Take 40 mg by mouth 2 (two) times daily.     hyoscyamine (LEVSIN SL) 0.125 MG SL tablet Take 0.25 mg by mouth every 6 (six) hours as needed (IBS).      isosorbide mononitrate (IMDUR) 30 MG 24 hr tablet Take 1 tablet (30 mg total) by mouth daily. 90 tablet 3   morphine (MS CONTIN) 15 MG 12 hr tablet Take 15 mg by mouth 2 (two) times daily as needed.     nitroGLYCERIN (NITROSTAT) 0.4 MG SL tablet Place 1 tablet (0.4 mg total) under the tongue every 5 (five) minutes as needed for chest pain (MAX 3 TABLETS). 25 tablet 7   oxyCODONE-acetaminophen (PERCOCET) 10-325 MG tablet Take 1 tablet by mouth every 8 (eight) hours.     sertraline (ZOLOFT) 100 MG tablet Take 2 tablets  (200 mg total) by mouth daily. 60 tablet 5   testosterone cypionate (DEPOTESTOSTERONE CYPIONATE) 200 MG/ML injection   3   zolpidem (AMBIEN) 10 MG tablet Take 1 tablet (10 mg total) by mouth at bedtime. 30 tablet 2   No current facility-administered medications for this visit.     Review of Systems  Please see the history of present illness.    (+) Shortness of breath (+) Chest pain  All other systems reviewed and are otherwise negative except as noted above.  Physical Exam    Wt Readings from Last 3 Encounters:  08/10/22 144 lb 9.6 oz (65.6 kg)  04/13/22 144 lb 12.8 oz (65.7 kg)  10/03/20 160 lb 6.4 oz (72.8 kg)   ZO:XWRUE were no vitals filed for this visit.,There is no height or weight on file to calculate BMI.  Constitutional:      Appearance: Healthy appearance. Not in distress.  Neck:     Vascular: JVD normal.  Pulmonary:     Effort: Pulmonary effort is normal.     Breath sounds: No wheezing. No rales. Diminished in the bases Cardiovascular:     Normal rate. Regular rhythm. Normal S1. Normal S2.      Murmurs: There is no murmur.  Edema:    Peripheral edema absent.  Abdominal:     Palpations: Abdomen is soft non tender. There is no hepatomegaly.  Skin:    General: Skin is warm and dry.  Neurological:     General: No focal deficit present.     Mental Status: Alert and oriented to person, place and time.     Cranial Nerves: Cranial nerves are intact.  EKG/LABS/ Recent Cardiac Studies    ECG personally reviewed by me today -sinus rhythm with rate of 70 bpm and left axis deviation with no acute changes consistent with previous EKG.   Lab Results  Component Value Date   WBC 7.3 09/01/2017   HGB 16.4 09/01/2017   HCT 46.4 09/01/2017   MCV  95.3 09/01/2017   PLT 129 (L) 09/01/2017   Lab Results  Component Value Date   CREATININE 1.03 09/01/2017   BUN 27 (H) 09/01/2017   NA 140 09/01/2017   K 4.0 09/01/2017   CL 105 09/01/2017   CO2 26 09/01/2017   Lab  Results  Component Value Date   ALT 23 03/08/2014   AST 24 03/08/2014   ALKPHOS 51 03/08/2014   BILITOT 1.00 03/08/2014   No results found for: "CHOL", "HDL", "LDLCALC", "LDLDIRECT", "TRIG", "CHOLHDL"  Lab Results  Component Value Date   HGBA1C 5.0 02/10/2013     Assessment & Plan    1.  Coronary artery disease: -s/p CABG x 3 in 2014 with complex PCI/stent placed 08/10/2013 due to failed saphenous to OM graft -Today patient reports chest discomfort that is waxing and waning but improvement since beginning isosorbide. -We will increase Imdur to 60 mg daily and patient was advised to contact us if chest discomfort continues.   2.  Nonrheumatic AVR: -s/p tissue valve placed 01/2013 with most recent 2D echo completed 08/2022 with EF of 55-60% mild concentric LVH and mean valve gradient of 25 mmHg -SBE prophylaxis discussed -Today patient reports ongoing shortness of breath but notes improvement to chest pain with current dose of Imdur. -Patient will continue GDMT with Entresto 24/26 mg twice daily, Norvasc 5 mg daily, Lasix 40 mg  3. Essential hypertension: -Patient's blood pressure today was well-controlled at 120/80 -Continue Norvasc 5 mg daily and Entresto 24/26 mg twice daily  4.  Hyperlipidemia: -Continue treatment plan per PCP  5.  Chest pain: -Patient reports improvement with chest discomfort since starting isosorbide. -He does note some break through discomfort and we will increase Imdur to 60 mg daily -Continue amlodipine 5 mg daily  Disposition: Follow-up with Max Schultz, MD or APP in 3 months    Medication Adjustments/Labs and Tests Ordered: Current medicines are reviewed at length with the patient today.  Concerns regarding medicines are outlined above.   Signed, Napoleon Form, Leodis Rains, NP 10/12/2022, 7:17 AM [facility]

## 2022-10-14 ENCOUNTER — Encounter: Payer: Self-pay | Admitting: Nurse Practitioner

## 2022-10-14 ENCOUNTER — Ambulatory Visit: Payer: Federal, State, Local not specified - PPO | Attending: Nurse Practitioner | Admitting: Nurse Practitioner

## 2022-10-14 VITALS — BP 120/80 | HR 68 | Ht 66.0 in | Wt 142.8 lb

## 2022-10-14 DIAGNOSIS — R079 Chest pain, unspecified: Secondary | ICD-10-CM

## 2022-10-14 DIAGNOSIS — I25810 Atherosclerosis of coronary artery bypass graft(s) without angina pectoris: Secondary | ICD-10-CM

## 2022-10-14 DIAGNOSIS — E782 Mixed hyperlipidemia: Secondary | ICD-10-CM

## 2022-10-14 DIAGNOSIS — I10 Essential (primary) hypertension: Secondary | ICD-10-CM | POA: Diagnosis not present

## 2022-10-14 DIAGNOSIS — I5032 Chronic diastolic (congestive) heart failure: Secondary | ICD-10-CM

## 2022-10-14 MED ORDER — ISOSORBIDE MONONITRATE ER 60 MG PO TB24: 60.0000 mg | ORAL_TABLET | Freq: Every day | ORAL | 0 refills | Status: DC

## 2022-10-14 NOTE — Patient Instructions (Signed)
Medication Instructions:  INCREASE Imdur to 60mg  Take 1 tablet once a day *If you need a refill on your cardiac medications before your next appointment, please call your pharmacy*   Lab Work: None ordered   Testing/Procedures: None ordered   Follow-Up: At [facility], you and your health needs are our priority.  As part of our continuing mission to provide you with exceptional heart care, we have created designated Provider Care Teams.  These Care Teams include your primary Cardiologist (physician) and Advanced Practice Providers (APPs -  Physician Assistants and Nurse Practitioners) who all work together to provide you with the care you need, when you need it.  We recommend signing up for the patient portal called "MyChart".  Sign up information is provided on this After Visit Summary.  MyChart is used to connect with patients for Virtual Visits (Telemedicine).  Patients are able to view lab/test results, encounter notes, upcoming appointments, etc.  Non-urgent messages can be sent to your provider as well.   To learn more about what you can do with MyChart, go to [email or web address].    Your next appointment:   3 month(s)  Provider:   Donato Schultz, MD  or Robin Searing, NP   Other Instructions

## 2022-10-22 ENCOUNTER — Other Ambulatory Visit: Payer: Self-pay

## 2022-10-22 MED ORDER — NITROGLYCERIN 0.4 MG SL SUBL: 0.4000 mg | SUBLINGUAL_TABLET | SUBLINGUAL | 11 refills | Status: AC | PRN

## 2022-11-10 ENCOUNTER — Other Ambulatory Visit: Payer: Self-pay | Admitting: Adult Health

## 2022-11-10 DIAGNOSIS — F410 Panic disorder [episodic paroxysmal anxiety] without agoraphobia: Secondary | ICD-10-CM

## 2022-11-10 DIAGNOSIS — F411 Generalized anxiety disorder: Secondary | ICD-10-CM

## 2022-11-21 ENCOUNTER — Other Ambulatory Visit: Payer: Self-pay | Admitting: Adult Health

## 2022-11-21 DIAGNOSIS — F410 Panic disorder [episodic paroxysmal anxiety] without agoraphobia: Secondary | ICD-10-CM

## 2022-11-21 DIAGNOSIS — F411 Generalized anxiety disorder: Secondary | ICD-10-CM

## 2022-11-26 ENCOUNTER — Other Ambulatory Visit: Payer: Self-pay | Admitting: Adult Health

## 2022-11-26 DIAGNOSIS — G4700 Insomnia, unspecified: Secondary | ICD-10-CM

## 2022-12-24 ENCOUNTER — Other Ambulatory Visit: Payer: Self-pay | Admitting: Adult Health

## 2022-12-24 DIAGNOSIS — G4700 Insomnia, unspecified: Secondary | ICD-10-CM

## 2023-01-26 ENCOUNTER — Ambulatory Visit: Payer: Federal, State, Local not specified - PPO | Admitting: Nurse Practitioner

## 2023-01-26 NOTE — Progress Notes (Signed)
Cardiology Office Note    Patient Name: Max Byrd Date of Encounter: 01/26/2023  Primary Care Provider:  Georgann Housekeeper, MD Primary Cardiologist:  Donato Schultz, MD Primary Electrophysiologist: None   Past Medical History    Past Medical History:  Diagnosis Date   Anginal pain (HCC) 2015   "since valve was done"   Anxiety    Arthritis    "left pointer finger; forminal openings bilaterally" (08/10/2013)   Bicuspid aortic valve    a. 01/2013 s/p AVR - 21mm Edwards 3300 TFX pericardial tissue valve, ser # [id number].   CKD (chronic kidney disease) stage 2, GFR 60-89 ml/min    Coronary atherosclerosis of native coronary artery    a. 01/2013 CABGx3: LIMA->LAD, VG->Diag, VG->OM (performed @ time of AVR),  occluded SVG-OM2 , now with 95% stenosis at the anastomosis site on OM 2 - PCI attempt 07/28/13 unsuccessful - med Rx cont'd    Depression    Erectile dysfunction    INJECTIONS OF PROSTAGLANDIN BY UROLGIST   Essential hypertension, benign    GERD (gastroesophageal reflux disease)    H/O Bell's palsy 2010 X2   Heart murmur    History of blood transfusion    "related to stomach bleeding from ASA & NSAIDS   History of stomach ulcers    Hyperlipidemia    IBS (irritable bowel syndrome)    Migraine    "went away in the 1990's"   Pain, chronic postoperative    S/P LEFT ARM SURGERY   Renal cell carcinoma of right kidney (HCC) 2003    History of Present Illness  Max Byrd  is a 67 year old male with a PMH of CAD s/p CABG x 3 and AVR repair 2014 PCI to OM1 07/2013, HTN, HLD, LBBB renal cancer, CKD, depression who presents today for 43-month follow-up.  Max Byrd was last seen on 10/14/2022 for follow-up visit.  During visit patient reported improvement to chest pain since starting Imdur but still occurring in a waxing and waning manner. EKG was completed that showed no acute abnormalities.  He continued to have complaints of shortness of breath as well as numbness and tingling in his fingers  and toes.  He increased his Imdur to 60 mg and patient was advised to contact our office if chest pain did not improve with increase.  Max Byrd presents today for a follow-up with his wife.  He reports ongoing chest pain and shortness of breath. The chest pain is intermittent and varies in location, sometimes felt in the heart and sometimes on the right side. The pain is described as sharp or stabbing at times, and as an ache at other times. The patient reports experiencing shortness of breath daily. In addition to the chest pain and shortness of breath, the patient also experiences numbness in the fingers and toes, occurring three to five times a week and lasting about twenty to thirty minutes each time. The numbness is not painful but does interfere with the patient's ability to play the guitar. The patient reports that the numbness is not necessarily triggered by cold weather and can occur even when indoors. The patient is currently on multiple medications including Antara, isosorbide, amlodipine, and Lasix, and receives testosterone shots every other week to maintain weight. The patient reports having to eat more than desired to maintain weight and experiences no swelling or fluid retention.  Review of Systems  Please see the history of present illness.    All other systems reviewed and are  otherwise negative except as noted above.  Physical Exam    Wt Readings from Last 3 Encounters:  10/14/22 142 lb 12.8 oz (64.8 kg)  08/10/22 144 lb 9.6 oz (65.6 kg)  04/13/22 144 lb 12.8 oz (65.7 kg)   YQ:MVHQI were no vitals filed for this visit.,There is no height or weight on file to calculate BMI. GEN: Well nourished, well developed in no acute distress Neck: No JVD; No carotid bruits Pulmonary: Clear to auscultation without rales, wheezing or rhonchi  Cardiovascular: Normal rate. Regular rhythm. Normal S1. Normal S2.   Murmurs: There is no murmur.  ABDOMEN: Soft, non-tender,  non-distended EXTREMITIES:  No edema; No deformity   EKG/LABS/ Recent Cardiac Studies   ECG personally reviewed by me today -none completed today  Risk Assessment/Calculations:          Lab Results  Component Value Date   WBC 7.3 09/01/2017   HGB 16.4 09/01/2017   HCT 46.4 09/01/2017   MCV 95.3 09/01/2017   PLT 129 (L) 09/01/2017   Lab Results  Component Value Date   CREATININE 1.03 09/01/2017   BUN 27 (H) 09/01/2017   NA 140 09/01/2017   K 4.0 09/01/2017   CL 105 09/01/2017   CO2 26 09/01/2017   No results found for: "CHOL", "HDL", "LDLCALC", "LDLDIRECT", "TRIG", "CHOLHDL"  Lab Results  Component Value Date   HGBA1C 5.0 02/10/2013   Assessment & Plan    1.Coronary artery disease HFpEF: -s/p CABG x 3 in 2014 with complex PCI/stent placed 08/10/2013 due to failed saphenous to OM graft -Today patient reports ongoing chest pain that is waxing and waning and relieved temporarily with Imdur 30 mg. -Patient is euvolemic on examination -Continue Entresto 24/26 mg as needed and Lasix 40 mg twice daily  2.Nonrheumatic AVR: -s/p tissue valve placed 01/2013 with most recent 2D echo completed 08/2022 with EF of 55-60% mild concentric LVH and mean valve gradient of 25 mmHg -SBE prophylaxis discussed  3.Essential hypertension: -Patient's blood pressure today was initially elevated at 122/90 and was 124/86 on recheck -Continue Norvasc 5 mg daily  4.  Chest pain: Chest pain described as sharp or stabbing, sometimes aching, located in the heart area and sometimes central. Pain is not reproducible with exertion or stress. Currently managed with Isosorbide (Antara) 30mg  daily, with some relief. -Continue Isosorbide 30mg  daily. -Consider increasing Amlodipine to 10mg  daily if pain increases. -Additional Isosorbide 30mg  as needed for unrelieved pain. -Consider stress test if pain increases or becomes more bothersome.  5.  Hyperlipidemia: -Currently followed by PCP -Continue current  lifestyle modifications  6. Raynaud's Phenomenon Frequent episodes of numbness and white discoloration in fingers, exacerbated by cold weather and possibly caffeine intake. -Advise lifestyle modifications including avoiding cold exposure, wearing gloves, reducing caffeine intake, and increasing exercise. -Consider medication adjustments if symptoms increase in intensity.  Disposition: Follow-up with Donato Schultz, MD or APP in 6 months    Signed, Napoleon Form, Leodis Rains, NP 01/26/2023, 10:09 AM [facility]

## 2023-01-29 ENCOUNTER — Ambulatory Visit: Payer: Federal, State, Local not specified - PPO | Attending: Nurse Practitioner | Admitting: Nurse Practitioner

## 2023-01-29 ENCOUNTER — Encounter: Payer: Self-pay | Admitting: Nurse Practitioner

## 2023-01-29 VITALS — BP 124/86 | HR 67 | Ht 66.0 in | Wt 141.0 lb

## 2023-01-29 DIAGNOSIS — I10 Essential (primary) hypertension: Secondary | ICD-10-CM

## 2023-01-29 DIAGNOSIS — I5032 Chronic diastolic (congestive) heart failure: Secondary | ICD-10-CM

## 2023-01-29 DIAGNOSIS — R079 Chest pain, unspecified: Secondary | ICD-10-CM

## 2023-01-29 DIAGNOSIS — Z952 Presence of prosthetic heart valve: Secondary | ICD-10-CM | POA: Diagnosis not present

## 2023-01-29 DIAGNOSIS — E782 Mixed hyperlipidemia: Secondary | ICD-10-CM | POA: Diagnosis not present

## 2023-01-29 DIAGNOSIS — I25810 Atherosclerosis of coronary artery bypass graft(s) without angina pectoris: Secondary | ICD-10-CM | POA: Diagnosis not present

## 2023-01-29 DIAGNOSIS — I7300 Raynaud's syndrome without gangrene: Secondary | ICD-10-CM

## 2023-01-29 NOTE — Patient Instructions (Signed)
Medication Instructions:  Decrease Imdur to 30 mg ( Take 1 Tablet Daily. Can Take An Additional 30 mg Tablet As Needed). *If you need a refill on your cardiac medications before your next appointment, please call your pharmacy*   Lab Work: No Labs If you have labs (blood work) drawn today and your tests are completely normal, you will receive your results only by: MyChart Message (if you have MyChart) OR A paper copy in the mail If you have any lab test that is abnormal or we need to change your treatment, we will call you to review the results.   Testing/Procedures: No Testing   Follow-Up: At [facility], you and your health needs are our priority.  As part of our continuing mission to provide you with exceptional heart care, we have created designated Provider Care Teams.  These Care Teams include your primary Cardiologist (physician) and Advanced Practice Providers (APPs -  Physician Assistants and Nurse Practitioners) who all work together to provide you with the care you need, when you need it.  We recommend signing up for the patient portal called "MyChart".  Sign up information is provided on this After Visit Summary.  MyChart is used to connect with patients for Virtual Visits (Telemedicine).  Patients are able to view lab/test results, encounter notes, upcoming appointments, etc.  Non-urgent messages can be sent to your provider as well.   To learn more about what you can do with MyChart, go to [email or web address].    Your next appointment:   6 month(s)  Provider:   Donato Schultz, MD

## 2023-02-04 ENCOUNTER — Other Ambulatory Visit: Payer: Self-pay | Admitting: Adult Health

## 2023-02-04 DIAGNOSIS — F410 Panic disorder [episodic paroxysmal anxiety] without agoraphobia: Secondary | ICD-10-CM

## 2023-02-04 DIAGNOSIS — F411 Generalized anxiety disorder: Secondary | ICD-10-CM

## 2023-02-04 NOTE — Telephone Encounter (Signed)
LF 12/4 DUE 1/1

## 2023-02-16 NOTE — Telephone Encounter (Signed)
Lf 12/4 lv 07/25 nv 1/23

## 2023-03-09 ENCOUNTER — Other Ambulatory Visit: Payer: Self-pay | Admitting: Adult Health

## 2023-03-09 DIAGNOSIS — G4700 Insomnia, unspecified: Secondary | ICD-10-CM

## 2023-03-11 ENCOUNTER — Ambulatory Visit (INDEPENDENT_AMBULATORY_CARE_PROVIDER_SITE_OTHER): Payer: Medicare Other | Admitting: Adult Health

## 2023-03-11 ENCOUNTER — Encounter: Payer: Self-pay | Admitting: Adult Health

## 2023-03-11 DIAGNOSIS — F410 Panic disorder [episodic paroxysmal anxiety] without agoraphobia: Secondary | ICD-10-CM | POA: Diagnosis not present

## 2023-03-11 DIAGNOSIS — F411 Generalized anxiety disorder: Secondary | ICD-10-CM

## 2023-03-11 DIAGNOSIS — G4700 Insomnia, unspecified: Secondary | ICD-10-CM | POA: Diagnosis not present

## 2023-03-11 DIAGNOSIS — F331 Major depressive disorder, recurrent, moderate: Secondary | ICD-10-CM | POA: Diagnosis not present

## 2023-03-11 MED ORDER — ZOLPIDEM TARTRATE 10 MG PO TABS: 10.0000 mg | ORAL_TABLET | Freq: Every day | ORAL | 2 refills | Status: DC

## 2023-03-11 MED ORDER — DULOXETINE HCL 60 MG PO CPEP: 120.0000 mg | ORAL_CAPSULE | Freq: Every day | ORAL | 5 refills | Status: DC

## 2023-03-11 MED ORDER — SERTRALINE HCL 100 MG PO TABS: 200.0000 mg | ORAL_TABLET | Freq: Every day | ORAL | 5 refills | Status: DC

## 2023-03-11 MED ORDER — ALPRAZOLAM 1 MG PO TABS: 1.0000 mg | ORAL_TABLET | Freq: Four times a day (QID) | ORAL | 2 refills | Status: DC

## 2023-03-11 NOTE — Progress Notes (Signed)
Max Byrd [id number] 07/30/1955 68 y.o.  Virtual Visit via Telephone Note  I connected with pt on 03/11/23 at  8:00 AM EST by telephone and verified that I am speaking with the correct person using two identifiers.   I discussed the limitations, risks, security and privacy concerns of performing an evaluation and management service by telephone and the availability of in person appointments. I also discussed with the patient that there may be a patient responsible charge related to this service. The patient expressed understanding and agreed to proceed.   I discussed the assessment and treatment plan with the patient. The patient was provided an opportunity to ask questions and all were answered. The patient agreed with the plan and demonstrated an understanding of the instructions.   The patient was advised to call back or seek an in-person evaluation if the symptoms worsen or if the condition fails to improve as anticipated.  I provided 25 minutes of non-face-to-face time during this encounter.  The patient was located at home.  The provider was located at [facility].   Dorothyann Gibbs, NP   Subjective:   Patient ID:  Max Byrd is a 68 y.o. (DOB 1956/02/08) male.  Chief Complaint: No chief complaint on file.   HPI Max Byrd presents for follow-up of GAD, MDD, insomnia, and panic attacks.   Describes mood today as "ok". Pleasant. Tearful at times. Mood symptoms - reports some depression, anxiety, and irritability - "when I get frustrated". Denies panic attacks. Denies worry, rumination, and over thinking - "all those things have passed now". Mood is stable. Stating "I feel like I'm doing alright for now - still deteriorating - more level now". Feels like medications are helpful. Reports his health continues to decline with multiple medical issues. Varying interest and motivation. Taking medications as prescribed. Energy levels lower. Active, does not have a regular  exercise routine. Enjoys some usual interests. Married. Lives with wife.  Appetite adequate. Weight gain - 137 to 143 pounds.  Sleeps well most nights with Ambien. Averages 8 hours.  Focus and concentration difficulties - reports memory loss. Reports reading and playing guitar. Completing tasks. Managing some aspects of household. Disabled - health issues.  Denies SI or HI.  Denies AH or VH. Denies substance use. Denies self harm.  Review of Systems:  Review of Systems  Musculoskeletal:  Negative for gait problem.  Neurological:  Negative for tremors.  Psychiatric/Behavioral:         Please refer to HPI    Medications: I have reviewed the patient's current medications.  Current Outpatient Medications  Medication Sig Dispense Refill   ALPRAZolam (XANAX) 1 MG tablet Take 1 tablet (1 mg total) by mouth 4 (four) times daily. 120 tablet 2   amLODipine (NORVASC) 5 MG tablet Take 1 tablet (5 mg total) by mouth daily. 90 tablet 3   carisoprodol (SOMA) 350 MG tablet Take 175 mg by mouth 2 (two) times daily.     cholestyramine (QUESTRAN) 4 g packet Take 1 packet by mouth daily.     clindamycin (CLEOCIN) 300 MG capsule TAKE 2 CAPSULES 1 HOUR PRIOR TO DENTAL APPOINTMENT.     Diclofenac-miSOPROStol 50-0.2 MG TBEC Take 1 tablet by mouth 2 (two) times daily.     DULoxetine (CYMBALTA) 60 MG capsule Take 2 capsules (120 mg total) by mouth daily. 60 capsule 5   ENTRESTO 24-26 MG Take 1 tablet by mouth 2 (two) times daily.     furosemide (LASIX) 40  MG tablet Take 40 mg by mouth 2 (two) times daily.     hyoscyamine (LEVSIN SL) 0.125 MG SL tablet Take 0.25 mg by mouth every 6 (six) hours as needed (IBS).      Hyprom-Naphaz-Polysorb-Zn Sulf (CLEAR EYES COMPLETE) SOLN as needed (dry eyes).     isosorbide mononitrate (IMDUR) 30 MG 24 hr tablet Take 30 mg by mouth daily.  Take 1 Tablet Daily. Can Take Additional 30 mg Tablet As Needed     isosorbide mononitrate (IMDUR) 60 MG 24 hr tablet Take 1 tablet (60  mg total) by mouth daily. (Patient taking differently: Take 30 mg by mouth daily.) 90 tablet 0   morphine (MS CONTIN) 15 MG 12 hr tablet Take 15 mg by mouth 2 (two) times daily as needed.     nitroGLYCERIN (NITROSTAT) 0.4 MG SL tablet Place 1 tablet (0.4 mg total) under the tongue every 5 (five) minutes as needed for chest pain (MAX 3 TABLETS). 25 tablet 11   oxyCODONE-acetaminophen (PERCOCET) 10-325 MG tablet Take 1 tablet by mouth every 8 (eight) hours.     sertraline (ZOLOFT) 100 MG tablet Take 2 tablets (200 mg total) by mouth daily. 60 tablet 5   testosterone cypionate (DEPOTESTOSTERONE CYPIONATE) 200 MG/ML injection   3   zolpidem (AMBIEN) 10 MG tablet Take 1 tablet (10 mg total) by mouth at bedtime. 30 tablet 2   No current facility-administered medications for this visit.    Medication Side Effects: None  Allergies:  Allergies  Allergen Reactions   Aspirin Other (See Comments)    H/O BLEEDING ULCER   Nsaids Other (See Comments)    Stomach bleeding   Tolmetin Other (See Comments)    Stomach bleeding   Zoloft [Sertraline Hcl] Other (See Comments)    FELT TERRIBLE   Klonopin [Clonazepam] Other (See Comments)    Erectile dysfunction   Beta Adrenergic Blockers Nausea Only    Metoprolol   Penicillins Nausea And Vomiting and Other (See Comments)    Has patient had a PCN reaction causing immediate rash, facial/tongue/throat swelling, SOB or lightheadedness with hypotension: No Has patient had a PCN reaction causing severe rash involving mucus membranes or skin necrosis: No Has patient had a PCN reaction that required hospitalization: No Has patient had a PCN reaction occurring within the last 10 years: No If all of the above answers are "NO", then may proceed with Cephalosporin use.    Reglan [Metoclopramide] Anxiety    EXCITABILITY     Past Medical History:  Diagnosis Date   Anginal pain (HCC) 2015   "since valve was done"   Anxiety    Arthritis    "left pointer finger;  forminal openings bilaterally" (08/10/2013)   Bicuspid aortic valve    a. 01/2013 s/p AVR - 21mm Edwards 3300 TFX pericardial tissue valve, ser # [id number].   CKD (chronic kidney disease) stage 2, GFR 60-89 ml/min    Coronary atherosclerosis of native coronary artery    a. 01/2013 CABGx3: LIMA->LAD, VG->Diag, VG->OM (performed @ time of AVR),  occluded SVG-OM2 , now with 95% stenosis at the anastomosis site on OM 2 - PCI attempt 07/28/13 unsuccessful - med Rx cont'd    Depression    Erectile dysfunction    INJECTIONS OF PROSTAGLANDIN BY UROLGIST   Essential hypertension, benign    GERD (gastroesophageal reflux disease)    H/O Bell's palsy 2010 X2   Heart murmur    History of blood transfusion    "related to stomach bleeding  from ASA & NSAIDS   History of stomach ulcers    Hyperlipidemia    IBS (irritable bowel syndrome)    Migraine    "went away in the 1990's"   Pain, chronic postoperative    S/P LEFT ARM SURGERY   Renal cell carcinoma of right kidney (HCC) 2003    Family History  Problem Relation Age of Onset   Cancer Mother        BREAST/OVARIAN   Heart disease Father        S/P MI/CABG    Social History   Socioeconomic History   Marital status: Married    Spouse name: Not on file   Number of children: Not on file   Years of education: Not on file   Highest education level: Not on file  Occupational History   Not on file  Tobacco Use   Smoking status: Former    Current packs/day: 0.00    Average packs/day: 1.5 packs/day for 6.3 years (9.4 ttl pk-yrs)    Types: Cigarettes    Start date: 10/06/1969    Quit date: 01/21/1976    Years since quitting: 47.1   Smokeless tobacco: Current    Types: Snuff   Tobacco comments:    quit  36 years ago  Vaping Use   Vaping status: Never Used  Substance and Sexual Activity   Alcohol use: Yes    Alcohol/week: 0.0 standard drinks of alcohol    Comment: "last beer was 01/2013"   Drug use: No   Sexual activity: Yes  Other Topics  Concern   Not on file  Social History Narrative   Was working out regularly prior to CABG/AVR.  Hunts/fishes.   Social Drivers of Corporate investment banker Strain: Not on file  Food Insecurity: Not on file  Transportation Needs: Not on file  Physical Activity: Not on file  Stress: Not on file  Social Connections: Not on file  Intimate Partner Violence: Not on file    Past Medical History, Surgical history, Social history, and Family history were reviewed and updated as appropriate.   Please see review of systems for further details on the patient's review from today.   Objective:   Physical Exam:  There were no vitals taken for this visit.  Physical Exam Neurological:     Mental Status: He is alert and oriented to person, place, and time.     Cranial Nerves: No dysarthria.  Psychiatric:        Attention and Perception: Attention and perception normal.        Mood and Affect: Mood normal.        Speech: Speech normal.        Behavior: Behavior is cooperative.        Thought Content: Thought content normal. Thought content is not paranoid or delusional. Thought content does not include homicidal or suicidal ideation. Thought content does not include homicidal or suicidal plan.        Cognition and Memory: Cognition and memory normal.        Judgment: Judgment normal.     Comments: Insight intact     Lab Review:     Component Value Date/Time   NA 140 09/01/2017 1018   NA 139 03/08/2014 1230   K 4.0 09/01/2017 1018   K 4.2 03/08/2014 1230   CL 105 09/01/2017 1018   CL 101 03/08/2014 1230   CO2 26 09/01/2017 1018   CO2 30 03/08/2014 1230   GLUCOSE  78 09/01/2017 1018   GLUCOSE 87 03/08/2014 1230   BUN 27 (H) 09/01/2017 1018   BUN 27 (H) 03/08/2014 1230   CREATININE 1.03 09/01/2017 1018   CREATININE 1.4 (H) 03/08/2014 1230   CALCIUM 8.9 09/01/2017 1018   CALCIUM 8.8 03/08/2014 1230   PROT 5.9 (L) 03/08/2014 1230   ALBUMIN 3.6 03/08/2014 1230   AST 24  03/08/2014 1230   ALT 23 03/08/2014 1230   ALKPHOS 51 03/08/2014 1230   BILITOT 1.00 03/08/2014 1230   GFRNONAA >60 09/01/2017 1018   GFRAA >60 09/01/2017 1018       Component Value Date/Time   WBC 7.3 09/01/2017 1018   RBC 4.87 09/01/2017 1018   HGB 16.4 09/01/2017 1018   HGB 13.7 07/03/2015 0818   HCT 46.4 09/01/2017 1018   HCT 38.3 (L) 07/03/2015 0818   PLT 129 (L) 09/01/2017 1018   PLT 146 07/03/2015 0818   MCV 95.3 09/01/2017 1018   MCV 88 07/03/2015 0818   MCH 33.7 09/01/2017 1018   MCHC 35.3 09/01/2017 1018   RDW 11.8 09/01/2017 1018   RDW 11.9 07/03/2015 0818   LYMPHSABS 1.0 09/01/2017 1018   LYMPHSABS 1.0 07/03/2015 0818   MONOABS 0.4 09/01/2017 1018   EOSABS 0.2 09/01/2017 1018   EOSABS 0.1 07/03/2015 0818   BASOSABS 0.0 09/01/2017 1018   BASOSABS 0.0 07/03/2015 0818    No results found for: "POCLITH", "LITHIUM"   No results found for: "PHENYTOIN", "PHENOBARB", "VALPROATE", "CBMZ"   .res Assessment: Plan:    Plan:  PDMP reviewed  1. Ambien 10mg  at hs 2. Xanax 1mg  4 x daily 3. Zoloft 100mg  2 daily 4. Cymbalta 60mg  BID   RTC 6 months   25 minutes spent dedicated to the care of this patient on the date of this encounter to include pre-visit review of records, ordering of medication, post visit documentation, and face-to-face time with the patient discussing GAD, MDD, insomnia, and panic attacks.   Patient advised to contact office with any questions, adverse effects, or acute worsening in signs and symptoms.  Discussed potential benefits, risk, and side effects of benzodiazepines to include potential risk of tolerance and dependence, as well as possible drowsiness. Advised patient not to drive if experiencing drowsiness and to take lowest possible effective dose to minimize risk of dependence and tolerance. Diagnoses and all orders for this visit:  Major depressive disorder, recurrent episode, moderate (HCC) -     DULoxetine (CYMBALTA) 60 MG capsule;  Take 2 capsules (120 mg total) by mouth daily. -     sertraline (ZOLOFT) 100 MG tablet; Take 2 tablets (200 mg total) by mouth daily.  Generalized anxiety disorder -     ALPRAZolam (XANAX) 1 MG tablet; Take 1 tablet (1 mg total) by mouth 4 (four) times daily. -     DULoxetine (CYMBALTA) 60 MG capsule; Take 2 capsules (120 mg total) by mouth daily. -     sertraline (ZOLOFT) 100 MG tablet; Take 2 tablets (200 mg total) by mouth daily.  Panic attacks -     ALPRAZolam (XANAX) 1 MG tablet; Take 1 tablet (1 mg total) by mouth 4 (four) times daily.  Insomnia, unspecified type -     zolpidem (AMBIEN) 10 MG tablet; Take 1 tablet (10 mg total) by mouth at bedtime.    Please see After Visit Summary for patient specific instructions.  No future appointments.  No orders of the defined types were placed in this encounter.     -------------------------------

## 2023-04-09 ENCOUNTER — Other Ambulatory Visit: Payer: Self-pay | Admitting: Nurse Practitioner

## 2023-06-16 ENCOUNTER — Telehealth: Payer: Self-pay | Admitting: Adult Health

## 2023-06-16 ENCOUNTER — Other Ambulatory Visit: Payer: Self-pay

## 2023-06-16 DIAGNOSIS — G4700 Insomnia, unspecified: Secondary | ICD-10-CM

## 2023-06-16 MED ORDER — ZOLPIDEM TARTRATE 10 MG PO TABS: 10.0000 mg | ORAL_TABLET | Freq: Every day | ORAL | 2 refills | Status: DC

## 2023-06-16 NOTE — Telephone Encounter (Signed)
 Pended Ambien  10 mg to [facility]

## 2023-06-16 NOTE — Telephone Encounter (Signed)
 Pt called requesting refill of Ambien  to   [facility] - [place], [place] - [address] [place] [address] Phone: [phone]  Fax: [phone]   Next appt 7/25

## 2023-08-19 ENCOUNTER — Other Ambulatory Visit: Payer: Self-pay | Admitting: Adult Health

## 2023-08-19 DIAGNOSIS — F411 Generalized anxiety disorder: Secondary | ICD-10-CM

## 2023-08-19 DIAGNOSIS — F410 Panic disorder [episodic paroxysmal anxiety] without agoraphobia: Secondary | ICD-10-CM

## 2023-09-03 ENCOUNTER — Telehealth: Payer: Self-pay | Admitting: Adult Health

## 2023-09-03 ENCOUNTER — Other Ambulatory Visit: Payer: Self-pay

## 2023-09-03 DIAGNOSIS — F411 Generalized anxiety disorder: Secondary | ICD-10-CM

## 2023-09-03 DIAGNOSIS — F410 Panic disorder [episodic paroxysmal anxiety] without agoraphobia: Secondary | ICD-10-CM

## 2023-09-03 MED ORDER — ALPRAZOLAM 1 MG PO TABS: 1.0000 mg | ORAL_TABLET | Freq: Four times a day (QID) | ORAL | 0 refills | Status: DC

## 2023-09-03 NOTE — Telephone Encounter (Signed)
 Pended.

## 2023-09-03 NOTE — Telephone Encounter (Signed)
 [facility] called on behalf of pt to get refill on Alprazolam 

## 2023-09-07 ENCOUNTER — Other Ambulatory Visit: Payer: Self-pay | Admitting: Adult Health

## 2023-09-07 DIAGNOSIS — G4700 Insomnia, unspecified: Secondary | ICD-10-CM

## 2023-09-10 ENCOUNTER — Telehealth (INDEPENDENT_AMBULATORY_CARE_PROVIDER_SITE_OTHER): Admitting: Adult Health

## 2023-09-10 ENCOUNTER — Encounter: Payer: Self-pay | Admitting: Adult Health

## 2023-09-10 DIAGNOSIS — G4700 Insomnia, unspecified: Secondary | ICD-10-CM | POA: Diagnosis not present

## 2023-09-10 DIAGNOSIS — F411 Generalized anxiety disorder: Secondary | ICD-10-CM | POA: Diagnosis not present

## 2023-09-10 DIAGNOSIS — F331 Major depressive disorder, recurrent, moderate: Secondary | ICD-10-CM

## 2023-09-10 DIAGNOSIS — F410 Panic disorder [episodic paroxysmal anxiety] without agoraphobia: Secondary | ICD-10-CM | POA: Diagnosis not present

## 2023-09-10 MED ORDER — ALPRAZOLAM 1 MG PO TABS: 1.0000 mg | ORAL_TABLET | Freq: Four times a day (QID) | ORAL | 2 refills | Status: DC

## 2023-09-10 MED ORDER — DULOXETINE HCL 60 MG PO CPEP: 120.0000 mg | ORAL_CAPSULE | Freq: Every day | ORAL | 5 refills | Status: DC

## 2023-09-10 MED ORDER — SERTRALINE HCL 100 MG PO TABS: 200.0000 mg | ORAL_TABLET | Freq: Every day | ORAL | 5 refills | Status: DC

## 2023-09-10 MED ORDER — ZOLPIDEM TARTRATE 10 MG PO TABS: 10.0000 mg | ORAL_TABLET | Freq: Every day | ORAL | 2 refills | Status: DC

## 2023-09-10 NOTE — Progress Notes (Signed)
 Max Byrd [id number] September 22, 1955 68 y.o.  Virtual Visit via Video Note  I connected with pt @ on 09/10/23 at  8:00 AM EDT by a video enabled telemedicine application and verified that I am speaking with the correct person using two identifiers.   I discussed the limitations of evaluation and management by telemedicine and the availability of in person appointments. The patient expressed understanding and agreed to proceed.  I discussed the assessment and treatment plan with the patient. The patient was provided an opportunity to ask questions and all were answered. The patient agreed with the plan and demonstrated an understanding of the instructions.   The patient was advised to call back or seek an in-person evaluation if the symptoms worsen or if the condition fails to improve as anticipated.  I provided 25 minutes of non-face-to-face time during this encounter.  The patient was located at home.  The provider was located at [facility].   Angeline LOISE Sayers, NP   Subjective:   Patient ID:  Max Byrd is a 68 y.o. (DOB [birth year]) male.  Chief Complaint: No chief complaint on file.   HPI AMMAAR ENCINA presents for follow-up of GAD, MDD, insomnia, and panic attacks.   Describes mood today as ok. Pleasant. Tearful at times. Mood symptoms - reports some depression, anxiety and irritability - those things seem to come with not feeling good. Reports lower interest and motivation. Denies panic attacks. Denies worry, rumination and over thinking. Reports chronic pain issues. Reports his health continues to decline with multiple medical issues. Reports mood is variable. Stating overall, I feel like I'm doing fairly good. Feels like medications are helpful. Taking medications as prescribed. Energy levels lower. Active, does not have a regular exercise routine. Enjoys some usual interests. Married. Lives with wife.  Appetite adequate. Weight gain - 143 to 145 pounds.  Sleeps well  most nights with Ambien . Averages 8 to 10 hours.  Focus and concentration difficulties - memory loss. Reports playing guitar. Completing tasks. Managing some aspects of household. Disabled - health issues.  Denies SI or HI.  Denies AH or VH. Denies substance use. Denies self harm.  Review of Systems:  Review of Systems  Musculoskeletal:  Negative for gait problem.  Neurological:  Negative for tremors.  Psychiatric/Behavioral:         Please refer to HPI    Medications: I have reviewed the patient's current medications.  Current Outpatient Medications  Medication Sig Dispense Refill   ALPRAZolam  (XANAX ) 1 MG tablet Take 1 tablet (1 mg total) by mouth 4 (four) times daily. 120 tablet 0   amLODipine  (NORVASC ) 5 MG tablet Take 1 tablet (5 mg total) by mouth daily. 90 tablet 3   carisoprodol  (SOMA ) 350 MG tablet Take 175 mg by mouth 2 (two) times daily.     cholestyramine (QUESTRAN) 4 g packet Take 1 packet by mouth daily.     clindamycin (CLEOCIN) 300 MG capsule TAKE 2 CAPSULES 1 HOUR PRIOR TO DENTAL APPOINTMENT.     Diclofenac -miSOPROStol  50-0.2 MG TBEC Take 1 tablet by mouth 2 (two) times daily.     DULoxetine  (CYMBALTA ) 60 MG capsule Take 2 capsules (120 mg total) by mouth daily. 60 capsule 5   ENTRESTO 24-26 MG Take 1 tablet by mouth 2 (two) times daily.     furosemide  (LASIX ) 40 MG tablet Take 40 mg by mouth 2 (two) times daily.     hyoscyamine  (LEVSIN  SL) 0.125 MG SL tablet Take 0.25 mg by  mouth every 6 (six) hours as needed (IBS).      Hyprom-Naphaz-Polysorb-Zn Sulf (CLEAR EYES COMPLETE) SOLN as needed (dry eyes).     isosorbide  mononitrate (IMDUR ) 30 MG 24 hr tablet Take 30 mg by mouth daily.  Take 1 Tablet Daily. Can Take Additional 30 mg Tablet As Needed     isosorbide  mononitrate (IMDUR ) 60 MG 24 hr tablet Take 1 tablet (60 mg total) by mouth daily. 90 tablet 3   morphine  (MS CONTIN ) 15 MG 12 hr tablet Take 15 mg by mouth 2 (two) times daily as needed.     nitroGLYCERIN   (NITROSTAT ) 0.4 MG SL tablet Place 1 tablet (0.4 mg total) under the tongue every 5 (five) minutes as needed for chest pain (MAX 3 TABLETS). 25 tablet 11   oxyCODONE -acetaminophen  (PERCOCET) 10-325 MG tablet Take 1 tablet by mouth every 8 (eight) hours.     sertraline  (ZOLOFT ) 100 MG tablet Take 2 tablets (200 mg total) by mouth daily. 60 tablet 5   testosterone cypionate (DEPOTESTOSTERONE CYPIONATE) 200 MG/ML injection   3   zolpidem  (AMBIEN ) 10 MG tablet Take 1 tablet (10 mg total) by mouth at bedtime. 30 tablet 2   No current facility-administered medications for this visit.    Medication Side Effects: None  Allergies:  Allergies  Allergen Reactions   Aspirin  Other (See Comments)    H/O BLEEDING ULCER   Nsaids Other (See Comments)    Stomach bleeding   Tolmetin Other (See Comments)    Stomach bleeding   Zoloft  [Sertraline  Hcl] Other (See Comments)    FELT TERRIBLE   Klonopin [Clonazepam] Other (See Comments)    Erectile dysfunction   Beta Adrenergic Blockers Nausea Only    Metoprolol    Penicillins Nausea And Vomiting and Other (See Comments)    Has patient had a PCN reaction causing immediate rash, facial/tongue/throat swelling, SOB or lightheadedness with hypotension: No Has patient had a PCN reaction causing severe rash involving mucus membranes or skin necrosis: No Has patient had a PCN reaction that required hospitalization: No Has patient had a PCN reaction occurring within the last 10 years: No If all of the above answers are NO, then may proceed with Cephalosporin use.    Reglan [Metoclopramide] Anxiety    EXCITABILITY     Past Medical History:  Diagnosis Date   Anginal pain (HCC) 2015   since valve was done   Anxiety    Arthritis    left pointer finger; forminal openings bilaterally (08/10/2013)   Bicuspid aortic valve    a. 01/2013 s/p AVR - 21mm Edwards 3300 TFX pericardial tissue valve, ser # [id number].   CKD (chronic kidney disease) stage 2, GFR 60-89  ml/min    Coronary atherosclerosis of native coronary artery    a. 01/2013 CABGx3: LIMA->LAD, VG->Diag, VG->OM (performed @ time of AVR),  occluded SVG-OM2 , now with 95% stenosis at the anastomosis site on OM 2 - PCI attempt 07/28/13 unsuccessful - med Rx cont'd    Depression    Erectile dysfunction    INJECTIONS OF PROSTAGLANDIN BY UROLGIST   Essential hypertension, benign    GERD (gastroesophageal reflux disease)    H/O Bell's palsy 2010 X2   Heart murmur    History of blood transfusion    related to stomach bleeding from ASA & NSAIDS   History of stomach ulcers    Hyperlipidemia    IBS (irritable bowel syndrome)    Migraine    went away in the 1990's  Pain, chronic postoperative    S/P LEFT ARM SURGERY   Renal cell carcinoma of right kidney (HCC) 2003    Family History  Problem Relation Age of Onset   Cancer Mother        BREAST/OVARIAN   Heart disease Father        S/P MI/CABG    Social History   Socioeconomic History   Marital status: Married    Spouse name: Not on file   Number of children: Not on file   Years of education: Not on file   Highest education level: Not on file  Occupational History   Not on file  Tobacco Use   Smoking status: Former    Current packs/day: 0.00    Average packs/day: 1.5 packs/day for 6.3 years (9.4 ttl pk-yrs)    Types: Cigarettes    Start date: 10/06/1969    Quit date: 01/21/1976    Years since quitting: 47.6   Smokeless tobacco: Current    Types: Snuff   Tobacco comments:    quit  36 years ago  Vaping Use   Vaping status: Never Used  Substance and Sexual Activity   Alcohol use: Yes    Alcohol/week: 0.0 standard drinks of alcohol    Comment: last beer was 01/2013   Drug use: No   Sexual activity: Yes  Other Topics Concern   Not on file  Social History Narrative   Was working out regularly prior to CABG/AVR.  Hunts/fishes.   Social Drivers of Corporate investment banker Strain: Not on file  Food Insecurity: Not  on file  Transportation Needs: Not on file  Physical Activity: Not on file  Stress: Not on file  Social Connections: Not on file  Intimate Partner Violence: Not on file    Past Medical History, Surgical history, Social history, and Family history were reviewed and updated as appropriate.   Please see review of systems for further details on the patient's review from today.   Objective:   Physical Exam:  There were no vitals taken for this visit.  Physical Exam Constitutional:      General: He is not in acute distress. Musculoskeletal:        General: No deformity.  Neurological:     Mental Status: He is alert and oriented to person, place, and time.     Coordination: Coordination normal.  Psychiatric:        Attention and Perception: Attention and perception normal. He does not perceive auditory or visual hallucinations.        Mood and Affect: Mood normal. Mood is not anxious or depressed. Affect is not labile, blunt, angry or inappropriate.        Speech: Speech normal.        Behavior: Behavior normal.        Thought Content: Thought content normal. Thought content is not paranoid or delusional. Thought content does not include homicidal or suicidal ideation. Thought content does not include homicidal or suicidal plan.        Cognition and Memory: Cognition and memory normal.        Judgment: Judgment normal.     Comments: Insight intact     Lab Review:     Component Value Date/Time   NA 140 09/01/2017 1018   NA 139 03/08/2014 1230   K 4.0 09/01/2017 1018   K 4.2 03/08/2014 1230   CL 105 09/01/2017 1018   CL 101 03/08/2014 1230   CO2 26 09/01/2017  1018   CO2 30 03/08/2014 1230   GLUCOSE 78 09/01/2017 1018   GLUCOSE 87 03/08/2014 1230   BUN 27 (H) 09/01/2017 1018   BUN 27 (H) 03/08/2014 1230   CREATININE 1.03 09/01/2017 1018   CREATININE 1.4 (H) 03/08/2014 1230   CALCIUM 8.9 09/01/2017 1018   CALCIUM 8.8 03/08/2014 1230   PROT 5.9 (L) 03/08/2014 1230    ALBUMIN  3.6 03/08/2014 1230   AST 24 03/08/2014 1230   ALT 23 03/08/2014 1230   ALKPHOS 51 03/08/2014 1230   BILITOT 1.00 03/08/2014 1230   GFRNONAA >60 09/01/2017 1018   GFRAA >60 09/01/2017 1018       Component Value Date/Time   WBC 7.3 09/01/2017 1018   RBC 4.87 09/01/2017 1018   HGB 16.4 09/01/2017 1018   HGB 13.7 07/03/2015 0818   HCT 46.4 09/01/2017 1018   HCT 38.3 (L) 07/03/2015 0818   PLT 129 (L) 09/01/2017 1018   PLT 146 07/03/2015 0818   MCV 95.3 09/01/2017 1018   MCV 88 07/03/2015 0818   MCH 33.7 09/01/2017 1018   MCHC 35.3 09/01/2017 1018   RDW 11.8 09/01/2017 1018   RDW 11.9 07/03/2015 0818   LYMPHSABS 1.0 09/01/2017 1018   LYMPHSABS 1.0 07/03/2015 0818   MONOABS 0.4 09/01/2017 1018   EOSABS 0.2 09/01/2017 1018   EOSABS 0.1 07/03/2015 0818   BASOSABS 0.0 09/01/2017 1018   BASOSABS 0.0 07/03/2015 0818    No results found for: POCLITH, LITHIUM   No results found for: PHENYTOIN, PHENOBARB, VALPROATE, CBMZ   .res Assessment: Plan:    Plan:  PDMP reviewed  1. Ambien  10mg  at hs 2. Xanax  1mg  4 x daily 3. Zoloft  100mg  2 daily 4. Cymbalta  60mg  BID   RTC 6 months   25 minutes spent dedicated to the care of this patient on the date of this encounter to include pre-visit review of records, ordering of medication, post visit documentation, and face-to-face time with the patient discussing GAD, MDD, insomnia, and panic attacks.   Patient advised to contact office with any questions, adverse effects, or acute worsening in signs and symptoms.  Discussed potential benefits, risk, and side effects of benzodiazepines to include potential risk of tolerance and dependence, as well as possible drowsiness. Advised patient not to drive if experiencing drowsiness and to take lowest possible effective dose to minimize risk of dependence and tolerance.  There are no diagnoses linked to this encounter.   Please see After Visit Summary for patient specific  instructions.  Future Appointments  Date Time Provider Department Center  09/10/2023  8:00 AM Ashlen Kiger Nattalie, NP [facility] None    No orders of the defined types were placed in this encounter.     -------------------------------

## 2023-11-20 ENCOUNTER — Other Ambulatory Visit: Payer: Self-pay | Admitting: Adult Health

## 2023-11-20 DIAGNOSIS — G4700 Insomnia, unspecified: Secondary | ICD-10-CM

## 2023-12-02 ENCOUNTER — Telehealth: Payer: Self-pay

## 2023-12-02 ENCOUNTER — Encounter: Payer: Self-pay | Admitting: Cardiology

## 2023-12-02 NOTE — Telephone Encounter (Signed)
 Patient has been scheduled for ov to provide clearance

## 2023-12-02 NOTE — Telephone Encounter (Signed)
   Name: Max Byrd  DOB: 05/20/1955  MRN: [id number]  Primary Cardiologist: Oneil Parchment, MD  Chart reviewed as part of pre-operative protocol coverage. Because of [name]'s past medical history and time since last visit, he will require a follow-up in-office visit in order to better assess preoperative cardiovascular risk.  Patient is overdue for 54-month follow-up.  He will need an inpatient appointment for cardiac evaluation.  Pre-op covering staff: - Please schedule appointment and call patient to inform them. If patient already had an upcoming appointment within acceptable timeframe, please add pre-op clearance to the appointment notes so provider is aware. - Please contact requesting surgeon's office via preferred method (i.e, phone, fax) to inform them of need for appointment prior to surgery.   Arth Nicastro D Joby Hershkowitz, NP  12/02/2023, 1:40 PM

## 2023-12-02 NOTE — Telephone Encounter (Signed)
   Pre-operative Risk Assessment    Patient Name: Max Byrd  DOB: 1955/10/03 MRN: [id number]   Date of last office visit: 01/29/23 [name], NP Date of next office visit: NONE   Request for Surgical Clearance    Procedure:  L4-5 LUMBAR FUSION  Date of Surgery:  Clearance TBD                                Surgeon:  DR VICTORY DELENA GUNNELS Surgeon's Group or Practice Name:  [facility] Phone number:  [phone] Fax number:  [phone]   Type of Clearance Requested:   - Medical    Type of Anesthesia:  General    Additional requests/questions:    SignedLucie DELENA Ku   12/02/2023, 12:58 PM

## 2023-12-08 ENCOUNTER — Encounter (HOSPITAL_BASED_OUTPATIENT_CLINIC_OR_DEPARTMENT_OTHER): Payer: Self-pay

## 2023-12-10 ENCOUNTER — Ambulatory Visit (INDEPENDENT_AMBULATORY_CARE_PROVIDER_SITE_OTHER): Admitting: Family

## 2023-12-10 ENCOUNTER — Encounter (HOSPITAL_BASED_OUTPATIENT_CLINIC_OR_DEPARTMENT_OTHER): Payer: Self-pay | Admitting: Family

## 2023-12-10 VITALS — BP 126/76 | HR 69 | Ht 66.0 in | Wt 147.5 lb

## 2023-12-10 DIAGNOSIS — E782 Mixed hyperlipidemia: Secondary | ICD-10-CM

## 2023-12-10 DIAGNOSIS — Z952 Presence of prosthetic heart valve: Secondary | ICD-10-CM | POA: Diagnosis not present

## 2023-12-10 DIAGNOSIS — I10 Essential (primary) hypertension: Secondary | ICD-10-CM

## 2023-12-10 DIAGNOSIS — I25810 Atherosclerosis of coronary artery bypass graft(s) without angina pectoris: Secondary | ICD-10-CM | POA: Diagnosis not present

## 2023-12-10 NOTE — Patient Instructions (Addendum)
 Medication Instructions:  Continue your current medications.  Discuss cholesterol lowering medications with Dr. Husain at next office visit.   *If you need a refill on your cardiac medications before your next appointment, please call your pharmacy*  Testing/Procedures: Your echocardiogram was stable from previous.   Follow-Up: At [facility], you and your health needs are our priority.  As part of our continuing mission to provide you with exceptional heart care, our providers are all part of one team.  This team includes your primary Cardiologist (physician) and Advanced Practice Providers or APPs (Physician Assistants and Nurse Practitioners) who all work together to provide you with the care you need, when you need it.  Your next appointment:   1 year(s)  Provider:   Oneil Parchment, MD    We recommend signing up for the patient portal called MyChart.  Sign up information is provided on this After Visit Summary.  MyChart is used to connect with patients for Virtual Visits (Telemedicine).  Patients are able to view lab/test results, encounter notes, upcoming appointments, etc.  Non-urgent messages can be sent to your provider as well.   To learn more about what you can do with MyChart, go to [email or web address].   Other Instructions  Type of cholesterol Labs  Goal  Total 186 Less than 200  HDL 63 More than 40  LDL 98 Less than 70  Triglycerides 141 Less than 150    Our goal is for your LDL to be less than 70. Research has shown that this significantly reduces your risk of further cardiovascular disease.   Recommend discussing addition of Rosuvastatin (Crestor) with Dr. Husain at your upcoming office visit. You could start at a low dose of 10mg  three times per week. This medication helps to lower cholesterol, stabilize existing plaque, and reduce existing plaque.    The American Heart Association has a wonderful website with resources on how to lower your cholesterol.  I've included a link below. The focus is to choose good fats (avocados, salt-free nuts, avocado oil, olive oil) rather than bad fats (fried foods, canola oil). The AHA descrbes a cholesterol-lowering diet as:    Reducing these fats means limiting your intake of red meat and dairy products made with whole milk. Choose skim milk, low-fat or fat-free dairy products instead. It also means limiting fried food and cooking with healthy oils, such as vegetable oil. A heart-healthy diet emphasizes fruits, vegetables, whole grains, poultry, fish, nuts and nontropical vegetable oils, while limiting red and processed meats, sodium and sugar-sweetened foods and beverages.

## 2023-12-10 NOTE — Progress Notes (Signed)
 Cardiology Office Note   Date:  12/10/2023  ID:  Max Byrd, DOB 11-22-1955, MRN [id number] PCP: Max Other, MD  [facility] Providers Cardiologist:  Max Parchment, MD     History of Present Illness Max Byrd is a 68 y.o. male with history of CAD s/p CABG X3 2014 (LIMA-LAD, SVG-OM2, AVG-diagonal) with complex PCI/stent placed 08/10/2013 due to failed SVG-OM graft,, HFpEF, nonrheumatic AS s/p AVR 2014, HTN, HLD, raynaud's  Echo 09/08/2022 normal LVEF 55 to 60%, mild concentric LVH, grade 1 diastolic dysfunction, RV SF mildly reduced, bioprosthetic aortic valve with mean gradient 25 mmHg which was stable from previous, mild dilation ascending aorta 41mm.   He was last seen 01/2023 with ongoing chest pain and shortness of breath. He was advised to continue imdur  30mg  daily and add additional 30mg  PRN for chest pain.   Discussed the use of AI scribe software for clinical note transcription with the patient, who gave verbal consent to proceed.  History of Present Illness Max Byrd is a 68 year old male with coronary artery disease who presents for preop clearance for L4-5 lumbar fusion with Dr. Malcolm of [facility] neurosurgery and spine. He is accompanied by his wife, Mrs. Frye.  He experiences intermittent chest pain at rest and during activity, described as 'building pressure' or 'stabbing'. This occurs at rest or with activity, has no clear causative factors, and occurs in different areas of his chest (right, left, midsternal). It is overall unchanged from previous. He has stopped using nitroglycerin  due to headaches and occasionally increases his isosorbide  dose from 30 mg to 60 mg as needed, which also causes headaches. He takes Lasix , Norvasc , and Entresto, and experiences swelling in his feet or ankles if these medications are not taken. He is not currently taking aspirin .  He can walk indoors and on level ground but requires a handrail for stairs and limits stairs at home due to  previous falls. He performs light to moderate housework but has given up yard work and moderate recreational activities due to back pain.  He has elevated LDL cholesterol, with a 02/2023 level of 98 mg/dL, and has not been on cholesterol medication since 2015, when he took simvastatin .  ROS: Please see the history of present illness.    All Byrd systems reviewed and are negative.   Studies Reviewed EKG Interpretation Date/Time:  Friday December 10 2023 09:01:46 EDT Ventricular Rate:  69 PR Interval:  248 QRS Duration:  138 QT Interval:  414 QTC Calculation: 443 R Axis:   -76  Text Interpretation: Sinus rhythm with 1st degree A-V block Left axis deviation Non-specific intra-ventricular conduction block Left ventricular hypertrophy No acute ST/T wave changes. Stable from previous. Confirmed by Vannie Mora ([id number]) on 12/10/2023 9:08:16 AM    Cardiac Studies & Procedures   ______________________________________________________________________________________________     ECHOCARDIOGRAM  ECHOCARDIOGRAM COMPLETE 09/08/2022  Narrative ECHOCARDIOGRAM REPORT    Patient Name:   Max Byrd   Date of Exam: 09/08/2022 Medical Rec #:  [id number]     Height:       66.0 in Accession #:    [id number]    Weight:       144.6 lb Date of Birth:  08-05-1955      BSA:          1.742 m Patient Age:    66 years      BP:           116/80 mmHg Patient Gender: M  HR:           75 bpm. Exam Location:  [address]  Procedure: 2D Echo, Cardiac Doppler and Color Doppler  Indications:    Z95.2 S/p AVR  History:        Patient has prior history of Echocardiogram examinations, most recent 10/25/2020. CHF, CAD, Prior CABG, S/p AVR (21mm Edwards 3300 TFX); Risk Factors:Former Smoker, Hypertension and Dyslipidemia.  Sonographer:    Elsie Bohr RDCS Referring Phys: [phone] MARK C SKAINS   Sonographer Comments: Technically difficult study due to poor echo windows. IMPRESSIONS   1. Left  ventricular ejection fraction, by estimation, is 55 to 60%. The left ventricle has normal function. The left ventricle demonstrates regional wall motion abnormalities with septal bounce consistent with prior cardiac surgery. There is mild concentric left ventricular hypertrophy. Left ventricular diastolic parameters are consistent with Grade I diastolic dysfunction (impaired relaxation). 2. Right ventricular systolic function is mildly reduced. The right ventricular size is normal. There is normal pulmonary artery systolic pressure. The estimated right ventricular systolic pressure is 25.5 mmHg. 3. Bioprosthetic aortic valve. There was no significant regurgitation noted. The mean gradient was elevated at 25 mmHg with EOA 1.21 cm^2. In short axis, the valve appeared relatively small but looked like it opened fairly normally. ?Patient-prosthetic mismatch. 4. The mitral valve is normal in structure. No evidence of mitral valve regurgitation. No evidence of mitral stenosis. 5. The inferior vena cava is normal in size with greater than 50% respiratory variability, suggesting right atrial pressure of 3 mmHg. 6. Aortic dilatation noted. There is mild dilatation of the ascending aorta, measuring 41 mm.  FINDINGS Left Ventricle: Left ventricular ejection fraction, by estimation, is 55 to 60%. The left ventricle has normal function. The left ventricle demonstrates regional wall motion abnormalities. The left ventricular internal cavity size was normal in size. There is mild concentric left ventricular hypertrophy. Left ventricular diastolic parameters are consistent with Grade I diastolic dysfunction (impaired relaxation).  Right Ventricle: The right ventricular size is normal. No increase in right ventricular wall thickness. Right ventricular systolic function is mildly reduced. There is normal pulmonary artery systolic pressure. The tricuspid regurgitant velocity is 2.37 m/s, and with an assumed right atrial  pressure of 3 mmHg, the estimated right ventricular systolic pressure is 25.5 mmHg.  Left Atrium: Left atrial size was normal in size.  Right Atrium: Right atrial size was normal in size.  Pericardium: There is no evidence of pericardial effusion.  Mitral Valve: The mitral valve is normal in structure. No evidence of mitral valve regurgitation. No evidence of mitral valve stenosis.  Tricuspid Valve: The tricuspid valve is normal in structure. Tricuspid valve regurgitation is mild.  Aortic Valve: Bioprosthetic aortic valve. There was no significant regurgitation noted. The mean gradient was elevated at 25 mmHg with EOA 1.21 cm^2. In short axis, the valve appeared relatively small but looked like it opened fairly normally. ?Patient-prosthetic mismatch. The aortic valve has been repaired/replaced. Aortic valve regurgitation is not visualized. Aortic valve mean gradient measures 25.0 mmHg. Aortic valve peak gradient measures 41.9 mmHg. Aortic valve area, by VTI measures 1.21 cm.  Pulmonic Valve: The pulmonic valve was normal in structure. Pulmonic valve regurgitation is trivial.  Aorta: The aortic root is normal in size and structure and aortic dilatation noted. There is mild dilatation of the ascending aorta, measuring 41 mm.  Venous: The inferior vena cava is normal in size with greater than 50% respiratory variability, suggesting right atrial pressure of 3 mmHg.  IAS/Shunts:  No atrial level shunt detected by color flow Doppler.   LEFT VENTRICLE PLAX 2D LVIDd:         4.35 cm   Diastology LVIDs:         3.15 cm   LV e' medial:    4.57 cm/s LV PW:         1.20 cm   LV E/e' medial:  17.0 LV IVS:        1.30 cm   LV e' lateral:   8.16 cm/s LVOT diam:     2.40 cm   LV E/e' lateral: 9.5 LV SV:         67 LV SV Index:   38 LVOT Area:     4.52 cm   RIGHT VENTRICLE            IVC RV S prime:     9.25 cm/s  IVC diam: 1.30 cm TAPSE (M-mode): 1.2 cm RVSP:           25.5 mmHg  LEFT  ATRIUM             Index        RIGHT ATRIUM           Index LA diam:        3.00 cm 1.72 cm/m   RA Pressure: 3.00 mmHg LA Vol (A2C):   42.8 ml 24.57 ml/m  RA Area:     13.40 cm LA Vol (A4C):   36.5 ml 20.95 ml/m  RA Volume:   33.60 ml  19.28 ml/m LA Biplane Vol: 41.9 ml 24.05 ml/m AORTIC VALVE AV Area (Vmax):    1.18 cm AV Area (Vmean):   1.21 cm AV Area (VTI):     1.21 cm AV Vmax:           323.75 cm/s AV Vmean:          214.250 cm/s AV VTI:            0.552 m AV Peak Grad:      41.9 mmHg AV Mean Grad:      25.0 mmHg LVOT Vmax:         84.60 cm/s LVOT Vmean:        57.500 cm/s LVOT VTI:          0.148 m LVOT/AV VTI ratio: 0.27  AORTA Ao Root diam: 3.20 cm Ao Asc diam:  4.10 cm  MITRAL VALVE                TRICUSPID VALVE MV Area (PHT): 2.48 cm     TR Peak grad:   22.5 mmHg MV Decel Time: 306 msec     TR Vmax:        237.00 cm/s MV E velocity: 77.60 cm/s   Estimated RAP:  3.00 mmHg MV A velocity: 124.00 cm/s  RVSP:           25.5 mmHg MV E/A ratio:  0.63 SHUNTS Systemic VTI:  0.15 m Systemic Diam: 2.40 cm  Dalton McleanMD Electronically signed by Ezra Kanner Signature Date/Time: 09/08/2022/9:35:08 AM    Final          ______________________________________________________________________________________________      Risk Assessment/Calculations           Physical Exam VS:  BP 126/76 (BP Location: Left Arm, Patient Position: Sitting, Cuff Size: Normal)   Pulse 69   Ht 5' 6 (1.676 m)   Wt 147 lb 8 oz (66.9 kg)   SpO2  98%   BMI 23.81 kg/m        Wt Readings from Last 3 Encounters:  12/10/23 147 lb 8 oz (66.9 kg)  01/29/23 141 lb (64 kg)  10/14/22 142 lb 12.8 oz (64.8 kg)    GEN: Well nourished, well developed in no acute distress NECK: No JVD; No carotid bruits CARDIAC: RRR, gr 3/6 systolic murmur, no rubs, gallops RESPIRATORY:  Clear to auscultation without rales, wheezing or rhonchi  ABDOMEN: Soft, non-tender,  non-distended EXTREMITIES:  No edema; No deformity   ASSESSMENT AND PLAN  Assessment & Plan Pre operative clearance EKG stable, similar to last year. Chest pain stable and overall atypical, no further cardiac testing needed. No blood thinners or aspirin  to hold. According to the Revised Cardiac Risk Index (RCRI), his Perioperative Risk of Major Cardiac Event is (%): 11. His Functional Capacity in METs is: 5.07 according to the [facility] Activity Status Index (DASI). Per AHA/ACC guidelines, he is deemed acceptable risk for the planned procedure without additional cardiovascular testing. Will route to surgical team so they are aware.    Coronary artery disease status post coronary artery bypass grafting with stable angina pectoris Intermittent chest pain with both typical and atypical characteristics. Nitroglycerin  intermittently effective but caused headaches. Isosorbide  dose reduced due to side effects. Discussed Ranexa as alternative, may cause dizziness.  If he notes increasing frequency or severity of chest pain episode he will contact us  and we will plan to initiate Ranexa. - No indication for ischemic evaluation. -Continue Imdur  30 mg daily. -Not on aspirin  due to history of GI bleed.  Prior intolerance with nausea to beta-blocker.  Discussion regarding statin, as below.  Essential hypertension BP well controlled. Continue current antihypertensive regimen amlodipine  5 mg daily, Entresto 24-26 mg twice daily, Lasix  40 mg twice daily, Imdur  30 mg daily. Discussed to monitor BP at home at least 2 hours after medications and sitting for 5-10 minutes.   HLD, LDL goal <70 LDL 98, above target <70. Discussed statins' benefits. Suggested rosuvastatin, prefers to consult Dr. Lunette first. - Discuss starting rosuvastatin with PCP (Dr. Lunette) during the next appointment.  Suggested dosing of 10 mg 3 times per week as hesitant regarding medication changes.  HFpEF Euvolemic and well compensated on exam.  Continue Entresto 24-26mg  BID, Lasix  40mg  BID, Imdur  30mg  daily. Low sodium diet, fluid restriction <2L, and daily weights encouraged. Educated to contact our office for weight gain of 2 lbs overnight or 5 lbs in one week.   S/p AVR Known murmur.  Stable by echo 08/2022 with stable mild dilation ascending aorta 41 mm.  Consider ordering repeat echo at next follow-up.         Dispo: follow up in 1 year with Dr. Jeffrie  Signed, Reche GORMAN Finder, NP

## 2023-12-13 ENCOUNTER — Other Ambulatory Visit: Payer: Self-pay | Admitting: Neurosurgery

## 2023-12-14 NOTE — Pre-Procedure Instructions (Signed)
 Surgical Instructions   Your procedure is scheduled on December 20, 2023. Report to [facility] Main Entrance A at 11:15 A.M., then check in with the Admitting office. Any questions or running late day of surgery: call [phone]  Questions prior to your surgery date: call [phone], Monday-Friday, 8am-4pm. If you experience any cold or flu symptoms such as cough, fever, chills, shortness of breath, etc. between now and your scheduled surgery, please notify us  at the above number.     Remember:  Do not eat after midnight the night before your surgery  You may drink clear liquids until 10:15AM the morning of your surgery.   Clear liquids allowed are: Water, Non-Citrus Juices (without pulp), Carbonated Beverages, Clear Tea (no milk, honey, etc.), Black Coffee Only (NO MILK, CREAM OR POWDERED CREAMER of any kind), and Gatorade.    Take these medicines the morning of surgery with A SIP OF WATER:  ALPRAZolam  (XANAX ) 1  amLODipine  (NORVASC )  carisoprodol  (SOMA )  DULoxetine  (CYMBALTA   isosorbide  mononitrate (IMDUR )  morphine  (MS CONTIN )   May take these medicines IF NEEDED: Hyprom-Naphaz-Polysorb-Zn Sulf (CLEAR EYES COMPLETE) SOLN  nitroGLYCERIN  (NITROSTAT ) 0.4 MG SL tablet - if you take this please call us  at [phone] oxyCODONE -acetaminophen  (PERCOCET)   One week prior to surgery, STOP taking any Aspirin  (unless otherwise instructed by your surgeon) Aleve, Naproxen, Ibuprofen, Motrin, Advil, Goody's, BC's, all herbal medications, fish oil, and non-prescription vitamins, including Diclofenac -miSOPROStol  50-0.2 MG TBEC                      Do NOT Smoke (Tobacco/Vaping) for 24 hours prior to your procedure.  If you use a CPAP at night, you may bring your mask/headgear for your overnight stay.   You will be asked to remove any contacts, glasses, piercing's, hearing aid's, dentures/partials prior to surgery. Please bring cases for these items if needed.    Patients discharged  the day of surgery will not be allowed to drive home, and someone needs to stay with them for 24 hours.  SURGICAL WAITING ROOM VISITATION Patients may have no more than 2 support people in the waiting area - these visitors may rotate.   Pre-op nurse will coordinate an appropriate time for 1 ADULT support person, who may not rotate, to accompany patient in pre-op.  Children under the age of 31 must have an adult with them who is not the patient and must remain in the main waiting area with an adult.  If the patient needs to stay at the hospital during part of their recovery, the visitor guidelines for inpatient rooms apply.  Please refer to the [facility] website for the visitor guidelines for any additional information.   If you received a COVID test during your pre-op visit  it is requested that you wear a mask when out in public, stay away from anyone that may not be feeling well and notify your surgeon if you develop symptoms. If you have been in contact with anyone that has tested positive in the last 10 days please notify you surgeon.      Pre-operative 4 CHG Bathing Instructions   You can play a key role in reducing the risk of infection after surgery. Your skin needs to be as free of germs as possible. You can reduce the number of germs on your skin by washing with CHG (chlorhexidine  gluconate) soap before surgery. CHG is an antiseptic soap that kills germs and continues to kill germs even after washing.  DO NOT use if you have an allergy to chlorhexidine /CHG or antibacterial soaps. If your skin becomes reddened or irritated, stop using the CHG and notify one of our RNs at [phone].   Please shower with the CHG soap starting 4 days before surgery using the following schedule:     Please keep in mind the following:  DO NOT shave, including legs and underarms, starting the day of your first shower.   You may shave your face at any point before/day of surgery.  Place clean  sheets on your bed the day you start using CHG soap. Use a clean washcloth (not used since being washed) for each shower. DO NOT sleep with pets once you start using the CHG.   CHG Shower Instructions:  Wash your face and private area with normal soap. If you choose to wash your hair, wash first with your normal shampoo.  After you use shampoo/soap, rinse your hair and body thoroughly to remove shampoo/soap residue.  Turn the water OFF and apply  bottle of CHG soap to a CLEAN washcloth.  Apply CHG soap ONLY FROM YOUR NECK DOWN TO YOUR TOES (washing for 3-5 minutes)  DO NOT use CHG soap on face, private areas, open wounds, or sores.  Pay special attention to the area where your surgery is being performed.  If you are having back surgery, having someone wash your back for you may be helpful. Wait 2 minutes after CHG soap is applied, then you may rinse off the CHG soap.  Pat dry with a clean towel  Put on clean clothes/pajamas   If you choose to wear lotion, please use ONLY the CHG-compatible lotions that are listed below.  Additional instructions for the day of surgery:  If you choose, you may shower the morning of surgery with an antibacterial soap.  DO NOT APPLY any lotions, deodorants, cologne, or perfumes.   Do not bring valuables to the hospital. [facility] is not responsible for any belongings/valuables. Do not wear nail polish, gel polish, artificial nails, or any other type of covering on natural nails (fingers and toes) Do not wear jewelry or makeup Put on clean/comfortable clothes.  Please brush your teeth.  Ask your nurse before applying any prescription medications to the skin.     CHG Compatible Lotions   Aveeno Moisturizing lotion  Cetaphil Moisturizing Cream  Cetaphil Moisturizing Lotion  Clairol Herbal Essence Moisturizing Lotion, Dry Skin  Clairol Herbal Essence Moisturizing Lotion, Extra Dry Skin  Clairol Herbal Essence Moisturizing Lotion, Normal Skin  Curel  Age Defying Therapeutic Moisturizing Lotion with Alpha Hydroxy  Curel Extreme Care Body Lotion  Curel Soothing Hands Moisturizing Hand Lotion  Curel Therapeutic Moisturizing Cream, Fragrance-Free  Curel Therapeutic Moisturizing Lotion, Fragrance-Free  Curel Therapeutic Moisturizing Lotion, Original Formula  Eucerin Daily Replenishing Lotion  Eucerin Dry Skin Therapy Plus Alpha Hydroxy Crme  Eucerin Dry Skin Therapy Plus Alpha Hydroxy Lotion  Eucerin Original Crme  Eucerin Original Lotion  Eucerin Plus Crme Eucerin Plus Lotion  Eucerin TriLipid Replenishing Lotion  Keri Anti-Bacterial Hand Lotion  Keri Deep Conditioning Original Lotion Dry Skin Formula Softly Scented  Keri Deep Conditioning Original Lotion, Fragrance Free Sensitive Skin Formula  Keri Lotion Fast Absorbing Fragrance Free Sensitive Skin Formula  Keri Lotion Fast Absorbing Softly Scented Dry Skin Formula  Keri Original Lotion  Keri Skin Renewal Lotion Keri Silky Smooth Lotion  Keri Silky Smooth Sensitive Skin Lotion  Nivea Body Creamy Conditioning Oil  Nivea Body Extra Enriched Lotion  [place]  Body Original Lotion  Nivea Body Sheer Moisturizing Lotion Nivea Crme  Nivea Skin Firming Lotion  NutraDerm 30 Skin Lotion  NutraDerm Skin Lotion  NutraDerm Therapeutic Skin Cream  NutraDerm Therapeutic Skin Lotion  ProShield Protective Hand Cream  Provon moisturizing lotion  Please read over the following fact sheets that you were given.

## 2023-12-15 ENCOUNTER — Encounter (HOSPITAL_COMMUNITY)
Admission: RE | Admit: 2023-12-15 | Discharge: 2023-12-15 | Disposition: A | Discharge status: Home / Self Care | Source: Ambulatory Visit | Attending: Neurosurgery | Admitting: Neurosurgery

## 2023-12-15 ENCOUNTER — Other Ambulatory Visit: Payer: Self-pay

## 2023-12-15 ENCOUNTER — Encounter (HOSPITAL_COMMUNITY): Payer: Self-pay

## 2023-12-15 VITALS — BP 125/84 | HR 67 | Temp 97.9°F | Resp 18 | Ht 66.0 in | Wt 145.4 lb

## 2023-12-15 DIAGNOSIS — N182 Chronic kidney disease, stage 2 (mild): Secondary | ICD-10-CM | POA: Insufficient documentation

## 2023-12-15 DIAGNOSIS — E785 Hyperlipidemia, unspecified: Secondary | ICD-10-CM | POA: Insufficient documentation

## 2023-12-15 DIAGNOSIS — I2510 Atherosclerotic heart disease of native coronary artery without angina pectoris: Secondary | ICD-10-CM | POA: Insufficient documentation

## 2023-12-15 DIAGNOSIS — Z01812 Encounter for preprocedural laboratory examination: Secondary | ICD-10-CM | POA: Insufficient documentation

## 2023-12-15 DIAGNOSIS — Z01818 Encounter for other preprocedural examination: Secondary | ICD-10-CM

## 2023-12-15 DIAGNOSIS — I130 Hypertensive heart and chronic kidney disease with heart failure and stage 1 through stage 4 chronic kidney disease, or unspecified chronic kidney disease: Secondary | ICD-10-CM | POA: Insufficient documentation

## 2023-12-15 DIAGNOSIS — I7300 Raynaud's syndrome without gangrene: Secondary | ICD-10-CM | POA: Insufficient documentation

## 2023-12-15 DIAGNOSIS — Z951 Presence of aortocoronary bypass graft: Secondary | ICD-10-CM | POA: Insufficient documentation

## 2023-12-15 DIAGNOSIS — I5032 Chronic diastolic (congestive) heart failure: Secondary | ICD-10-CM | POA: Insufficient documentation

## 2023-12-15 LAB — BASIC METABOLIC PANEL WITH GFR
Anion gap: 13 (ref 5–15)
BUN: 19 mg/dL (ref 8–23)
CO2: 28 mmol/L (ref 22–32)
Calcium: 8.7 mg/dL — ABNORMAL LOW (ref 8.9–10.3)
Chloride: 98 mmol/L (ref 98–111)
Creatinine, Ser: 1.15 mg/dL (ref 0.61–1.24)
GFR, Estimated: >60 mL/min (ref >60)
Glucose, Bld: 77 mg/dL (ref 70–99)
Potassium: 4.9 mmol/L (ref 3.5–5.1)
Sodium: 139 mmol/L (ref 135–145)

## 2023-12-15 LAB — SURGICAL PCR SCREEN
MRSA, PCR: NEGATIVE
Staphylococcus aureus: NEGATIVE

## 2023-12-15 NOTE — Progress Notes (Signed)
 PCP - Dr. Ardell Manly Cardiologist - Dr. Oneil Parchment - clearance on 12-10-23  PPM/ICD - Denies Device Orders - n/a Rep Notified - n/a  Chest x-ray - n/a EKG - 12-10-23 Stress Test - denis ECHO - 09-08-22 Cardiac Cath - 07-29-13  Sleep Study - denies CPAP - n/a  NON-diabetic  Last dose of GLP1 agonist-  Denies GLP1 instructions: n/a  Blood Thinner Instructions: Denies Aspirin  Instructions:Denies  ERAS Protcol - Clears until 1015 PRE-SURGERY Ensure or G2- none  COVID TEST- n/a   Anesthesia review: Yes, CAD, HTN, CKD  Patient denies shortness of breath, fever, cough and chest pain at PAT appointment   All instructions explained to the patient, with a verbal understanding of the material. Patient agrees to go over the instructions while at home for a better understanding. Patient also instructed to self quarantine after being tested for COVID-19. The opportunity to ask questions was provided.

## 2023-12-16 NOTE — Progress Notes (Signed)
 Anesthesia Chart Review:  68 year old male follows with cardiology for history of  CAD s/p CABG X3 2014 (LIMA-LAD, SVG-OM2, AVG-diagonal) with complex PCI/stent placed 08/10/2013 due to failed SVG-OM graft, HFpEF, nonrheumatic AS s/p bioprosthetic AVR 2014, HTN, HLD, chronic angina on Imdur , raynaud's. Echo 09/08/2022 normal LVEF 55 to 60%, mild concentric LVH, grade 1 diastolic dysfunction, RV SF mildly reduced, bioprosthetic aortic valve with mean gradient 25 mmHg which was stable from previous, mild dilation ascending aorta 41mm.  Seen by Reche Finder, NP on 12/10/2023 for preop evaluation.  Per note, EKG stable, similar to last year. Chest pain stable and overall atypical, no further cardiac testing needed. No blood thinners or aspirin  to hold. According to the Revised Cardiac Risk Index (RCRI), his Perioperative Risk of Major Cardiac Event is (%): 11. His Functional Capacity in METs is: 5.07 according to the [facility] Activity Status Index (DASI). Per AHA/ACC guidelines, he is deemed acceptable risk for the planned procedure without additional cardiovascular testing. Will route to surgical team so they are aware.   Other pertinent history includes right renal cell carcinoma s/p remote partial nephrectomy, CKD 2.  Preop BMP reviewed, unremarkable. Pt will need CBC DOS as it was not completed in PAT.  EKG 12/10/2023: Sinus rhythm with 1st degree A-V block.  Rate 69. Left axis deviation. Non-specific intra-ventricular conduction block. Left ventricular hypertrophy. No acute ST/T wave changes. Stable from previous.  TTE 09/08/2022: 1. Left ventricular ejection fraction, by estimation, is 55 to 60%. The  left ventricle has normal function. The left ventricle demonstrates  regional wall motion abnormalities with septal bounce consistent with  prior cardiac surgery. There is mild  concentric left ventricular hypertrophy. Left ventricular diastolic  parameters are consistent with Grade I diastolic  dysfunction (impaired  relaxation).   2. Right ventricular systolic function is mildly reduced. The right  ventricular size is normal. There is normal pulmonary artery systolic  pressure. The estimated right ventricular systolic pressure is 25.5 mmHg.   3. Bioprosthetic aortic valve. There was no significant regurgitation  noted. The mean gradient was elevated at 25 mmHg with EOA 1.21 cm^2. In  short axis, the valve appeared relatively small but looked like it opened  fairly normally. ?Patient-prosthetic  mismatch.   4. The mitral valve is normal in structure. No evidence of mitral valve  regurgitation. No evidence of mitral stenosis.   5. The inferior vena cava is normal in size with greater than 50%  respiratory variability, suggesting right atrial pressure of 3 mmHg.   6. Aortic dilatation noted. There is mild dilatation of the ascending  aorta, measuring 41 mm.     Lynwood Geofm RIGGERS [facility] Short Stay Center/Anesthesiology Phone [phone] 12/16/2023 4:15 PM

## 2023-12-16 NOTE — Progress Notes (Signed)
 CBC will need to be recollected on day of surgery.

## 2023-12-16 NOTE — Anesthesia Preprocedure Evaluation (Addendum)
 Anesthesia Evaluation  Patient identified by MRN, date of birth, ID band Patient awake    Reviewed: Allergy & Precautions, NPO status , Patient's Chart, lab work & pertinent test results  Airway Mallampati: III  TM Distance: >3 FB Neck ROM: Full    Dental no notable dental hx.    Pulmonary former smoker   Pulmonary exam normal        Cardiovascular hypertension, Pt. on medications + angina  + CAD, + Cardiac Stents and + CABG  Normal cardiovascular exam  ECHO: 1. Left ventricular ejection fraction, by estimation, is 55 to 60%. The  left ventricle has normal function. The left ventricle demonstrates  regional wall motion abnormalities with septal bounce consistent with  prior cardiac surgery. There is mild  concentric left ventricular hypertrophy. Left ventricular diastolic  parameters are consistent with Grade I diastolic dysfunction (impaired  relaxation).   2. Right ventricular systolic function is mildly reduced. The right  ventricular size is normal. There is normal pulmonary artery systolic  pressure. The estimated right ventricular systolic pressure is 25.5 mmHg.   3. Bioprosthetic aortic valve. There was no significant regurgitation  noted. The mean gradient was elevated at 25 mmHg with EOA 1.21 cm^2. In  short axis, the valve appeared relatively small but looked like it opened  fairly normally. ?Patient-prosthetic  mismatch.   4. The mitral valve is normal in structure. No evidence of mitral valve  regurgitation. No evidence of mitral stenosis.   5. The inferior vena cava is normal in size with greater than 50%  respiratory variability, suggesting right atrial pressure of 3 mmHg.   6. Aortic dilatation noted. There is mild dilatation of the ascending  aorta, measuring 41 mm.     Neuro/Psych  Headaches PSYCHIATRIC DISORDERS Anxiety Depression       GI/Hepatic negative GI ROS,,,(+)     substance abuse     Endo/Other  negative endocrine ROS    Renal/GU      Musculoskeletal  (+)  narcotic dependent  Abdominal   Peds  Hematology negative hematology ROS (+)   Anesthesia Other Findings Spondylolisthesis, lumbosacral region  Reproductive/Obstetrics                              Anesthesia Physical Anesthesia Plan  ASA: 4  Anesthesia Plan: General   Post-op Pain Management: Precedex    Induction: Intravenous  PONV Risk Score and Plan: 2 and Ondansetron , Dexamethasone , Midazolam  and Treatment may vary due to age or medical condition  Airway Management Planned: Oral ETT  Additional Equipment:   Intra-op Plan:   Post-operative Plan: Extubation in OR  Informed Consent: I have reviewed the patients History and Physical, chart, labs and discussed the procedure including the risks, benefits and alternatives for the proposed anesthesia with the patient or authorized representative who has indicated his/her understanding and acceptance.     Dental advisory given  Plan Discussed with: CRNA  Anesthesia Plan Comments: (PAT note by Lynwood Hope, PA-C: 68 year old male follows with cardiology for history of CAD s/p CABG X3 2014 (LIMA-LAD, SVG-OM2, AVG-diagonal) with complex PCI/stent placed 08/10/2013 due to failed SVG-OM graft, HFpEF, nonrheumatic AS s/p bioprosthetic AVR 2014, HTN, HLD, chronic angina on Imdur , raynaud's. Echo 09/08/2022 normal LVEF 55 to 60%, mild concentric LVH, grade 1 diastolic dysfunction, RV SF mildly reduced, bioprosthetic aortic valve with mean gradient 25 mmHg which was stable from previous, mild dilation ascending aorta 41mm.  Seen by Caitlin  Vannie, NP on 12/10/2023 for preop evaluation.  Per note, EKG stable, similar to last year. Chest pain stable and overall atypical, no further cardiac testing needed. No blood thinners or aspirin  to hold. According to the Revised Cardiac Risk Index (RCRI), his Perioperative Risk of Major Cardiac  Event is (%): 11. His Functional Capacity in METs is: 5.07 according to the Duke Activity Status Index (DASI). Per AHA/ACC guidelines, he is deemed acceptable risk for the planned procedure without additional cardiovascular testing. Will route to surgical team so they are aware.   Other pertinent history includes right renal cell carcinoma s/p remote partial nephrectomy, CKD 2.  Preop BMP reviewed, unremarkable. Pt will need CBC DOS as it was not completed in PAT.  EKG 12/10/2023: Sinus rhythm with 1st degree A-V block.  Rate 69. Left axis deviation. Non-specific intra-ventricular conduction block. Left ventricular hypertrophy. No acute ST/T wave changes. Stable from previous.  TTE 09/08/2022: 1. Left ventricular ejection fraction, by estimation, is 55 to 60%. The  left ventricle has normal function. The left ventricle demonstrates  regional wall motion abnormalities with septal bounce consistent with  prior cardiac surgery. There is mild  concentric left ventricular hypertrophy. Left ventricular diastolic  parameters are consistent with Grade I diastolic dysfunction (impaired  relaxation).  2. Right ventricular systolic function is mildly reduced. The right  ventricular size is normal. There is normal pulmonary artery systolic  pressure. The estimated right ventricular systolic pressure is 25.5 mmHg.  3. Bioprosthetic aortic valve. There was no significant regurgitation  noted. The mean gradient was elevated at 25 mmHg with EOA 1.21 cm^2. In  short axis, the valve appeared relatively small but looked like it opened  fairly normally. ?Patient-prosthetic  mismatch.  4. The mitral valve is normal in structure. No evidence of mitral valve  regurgitation. No evidence of mitral stenosis.  5. The inferior vena cava is normal in size with greater than 50%  respiratory variability, suggesting right atrial pressure of 3 mmHg.  6. Aortic dilatation noted. There is mild dilatation of the  ascending  aorta, measuring 41 mm.    )         Anesthesia Quick Evaluation

## 2023-12-20 ENCOUNTER — Encounter (HOSPITAL_COMMUNITY)
Admission: RE | Disposition: A | Discharge status: Home / Self Care | Payer: Self-pay | Source: Home / Self Care | Attending: Neurosurgery

## 2023-12-20 ENCOUNTER — Ambulatory Visit (HOSPITAL_COMMUNITY): Payer: Self-pay | Admitting: Physician Assistant

## 2023-12-20 ENCOUNTER — Observation Stay (HOSPITAL_COMMUNITY)
Admission: RE | Admit: 2023-12-20 | Discharge: 2023-12-21 | Disposition: A | Attending: Neurosurgery | Admitting: Neurosurgery

## 2023-12-20 ENCOUNTER — Ambulatory Visit (HOSPITAL_COMMUNITY)

## 2023-12-20 ENCOUNTER — Ambulatory Visit (HOSPITAL_COMMUNITY): Payer: Self-pay | Admitting: Registered Nurse

## 2023-12-20 DIAGNOSIS — N182 Chronic kidney disease, stage 2 (mild): Secondary | ICD-10-CM | POA: Diagnosis not present

## 2023-12-20 DIAGNOSIS — M48061 Spinal stenosis, lumbar region without neurogenic claudication: Secondary | ICD-10-CM | POA: Insufficient documentation

## 2023-12-20 DIAGNOSIS — Z955 Presence of coronary angioplasty implant and graft: Secondary | ICD-10-CM | POA: Insufficient documentation

## 2023-12-20 DIAGNOSIS — I2510 Atherosclerotic heart disease of native coronary artery without angina pectoris: Secondary | ICD-10-CM | POA: Diagnosis not present

## 2023-12-20 DIAGNOSIS — I25119 Atherosclerotic heart disease of native coronary artery with unspecified angina pectoris: Secondary | ICD-10-CM | POA: Diagnosis not present

## 2023-12-20 DIAGNOSIS — Z79899 Other long term (current) drug therapy: Secondary | ICD-10-CM | POA: Insufficient documentation

## 2023-12-20 DIAGNOSIS — Z87891 Personal history of nicotine dependence: Secondary | ICD-10-CM | POA: Insufficient documentation

## 2023-12-20 DIAGNOSIS — I129 Hypertensive chronic kidney disease with stage 1 through stage 4 chronic kidney disease, or unspecified chronic kidney disease: Secondary | ICD-10-CM | POA: Diagnosis not present

## 2023-12-20 DIAGNOSIS — M4317 Spondylolisthesis, lumbosacral region: Secondary | ICD-10-CM

## 2023-12-20 DIAGNOSIS — M4316 Spondylolisthesis, lumbar region: Principal | ICD-10-CM | POA: Insufficient documentation

## 2023-12-20 DIAGNOSIS — M549 Dorsalgia, unspecified: Secondary | ICD-10-CM | POA: Diagnosis present

## 2023-12-20 DIAGNOSIS — Z85528 Personal history of other malignant neoplasm of kidney: Secondary | ICD-10-CM | POA: Insufficient documentation

## 2023-12-20 LAB — POCT I-STAT, CHEM 8
BUN: 13 mg/dL (ref 8–23)
Calcium, Ion: 1.07 mmol/L — ABNORMAL LOW (ref 1.15–1.40)
Chloride: 101 mmol/L (ref 98–111)
Creatinine, Ser: 0.9 mg/dL (ref 0.61–1.24)
Glucose, Bld: 90 mg/dL (ref 70–99)
HCT: 39 % (ref 39.0–52.0)
Hemoglobin: 13.3 g/dL (ref 13.0–17.0)
Potassium: 3.4 mmol/L — ABNORMAL LOW (ref 3.5–5.1)
Sodium: 138 mmol/L (ref 135–145)
TCO2: 25 mmol/L (ref 22–32)

## 2023-12-20 SURGERY — POSTERIOR LUMBAR FUSION 1 LEVEL
Anesthesia: General | Site: Back

## 2023-12-20 MED ORDER — HYDROMORPHONE HCL 1 MG/ML IJ SOLN
INTRAMUSCULAR | Status: AC
  Filled 2023-12-20: qty 0.5

## 2023-12-20 MED ORDER — CHLORHEXIDINE GLUCONATE CLOTH 2 % EX PADS: 6.0000 | MEDICATED_PAD | Freq: Once | CUTANEOUS | Status: DC

## 2023-12-20 MED ORDER — PHENYLEPHRINE HCL (PRESSORS) 10 MG/ML IV SOLN
INTRAVENOUS | Status: DC | PRN
  Administered 2023-12-20 (×4): 100 ug via INTRAVENOUS
  Administered 2023-12-20: 80 ug via INTRAVENOUS

## 2023-12-20 MED ORDER — PROPOFOL 10 MG/ML IV BOLUS
INTRAVENOUS | Status: DC | PRN
  Administered 2023-12-20: 200 mg via INTRAVENOUS

## 2023-12-20 MED ORDER — SERTRALINE HCL 100 MG PO TABS
200.0000 mg | ORAL_TABLET | Freq: Every day | ORAL | Status: DC
  Administered 2023-12-20: 200 mg via ORAL
  Filled 2023-12-20: qty 2

## 2023-12-20 MED ORDER — ONDANSETRON HCL 4 MG/2ML IJ SOLN: 4.0000 mg | Freq: Four times a day (QID) | INTRAMUSCULAR | Status: DC | PRN

## 2023-12-20 MED ORDER — CHLORHEXIDINE GLUCONATE CLOTH 2 % EX PADS
6.0000 | MEDICATED_PAD | Freq: Once | CUTANEOUS | Status: AC
  Administered 2023-12-20: 6 via TOPICAL

## 2023-12-20 MED ORDER — HYDROMORPHONE HCL 1 MG/ML IJ SOLN
INTRAMUSCULAR | Status: AC
  Filled 2023-12-20: qty 1

## 2023-12-20 MED ORDER — HYDRALAZINE HCL 20 MG/ML IJ SOLN
INTRAMUSCULAR | Status: AC
  Filled 2023-12-20: qty 1

## 2023-12-20 MED ORDER — LABETALOL HCL 5 MG/ML IV SOLN
INTRAVENOUS | Status: AC
  Filled 2023-12-20: qty 4

## 2023-12-20 MED ORDER — MIDAZOLAM HCL 5 MG/5ML IJ SOLN
INTRAMUSCULAR | Status: DC | PRN
Start: 2023-12-20 — End: 2023-12-20
  Administered 2023-12-20 (×2): 2 mg via INTRAVENOUS

## 2023-12-20 MED ORDER — PHENYLEPHRINE 80 MCG/ML (10ML) SYRINGE FOR IV PUSH (FOR BLOOD PRESSURE SUPPORT)
PREFILLED_SYRINGE | INTRAVENOUS | Status: AC
  Filled 2023-12-20: qty 10

## 2023-12-20 MED ORDER — FUROSEMIDE 40 MG PO TABS: 40.0000 mg | ORAL_TABLET | Freq: Two times a day (BID) | ORAL | Status: DC | PRN

## 2023-12-20 MED ORDER — FENTANYL CITRATE (PF) 250 MCG/5ML IJ SOLN
INTRAMUSCULAR | Status: DC | PRN
  Administered 2023-12-20 (×2): 100 ug via INTRAVENOUS

## 2023-12-20 MED ORDER — OXYCODONE HCL 5 MG PO TABS
ORAL_TABLET | ORAL | Status: AC
  Filled 2023-12-20: qty 2

## 2023-12-20 MED ORDER — LACTATED RINGERS IV SOLN: INTRAVENOUS | Status: DC

## 2023-12-20 MED ORDER — AMLODIPINE BESYLATE 5 MG PO TABS
5.0000 mg | ORAL_TABLET | Freq: Every day | ORAL | Status: DC
  Administered 2023-12-20: 5 mg via ORAL
  Filled 2023-12-20 (×2): qty 1

## 2023-12-20 MED ORDER — ROCURONIUM BROMIDE 10 MG/ML (PF) SYRINGE
PREFILLED_SYRINGE | INTRAVENOUS | Status: DC | PRN
  Administered 2023-12-20: 10 mg via INTRAVENOUS
  Administered 2023-12-20 (×2): 20 mg via INTRAVENOUS
  Administered 2023-12-20: 60 mg via INTRAVENOUS

## 2023-12-20 MED ORDER — MORPHINE SULFATE ER 15 MG PO TBCR
15.0000 mg | EXTENDED_RELEASE_TABLET | Freq: Two times a day (BID) | ORAL | Status: DC
  Administered 2023-12-20: 15 mg via ORAL
  Filled 2023-12-20: qty 1

## 2023-12-20 MED ORDER — HYOSCYAMINE SULFATE 0.125 MG SL SUBL: 0.2500 mg | SUBLINGUAL_TABLET | Freq: Four times a day (QID) | SUBLINGUAL | Status: DC | PRN

## 2023-12-20 MED ORDER — DEXMEDETOMIDINE HCL IN NACL 80 MCG/20ML IV SOLN
INTRAVENOUS | Status: DC | PRN
Start: 2023-12-20 — End: 2023-12-20
  Administered 2023-12-20: 12 ug via INTRAVENOUS

## 2023-12-20 MED ORDER — SODIUM CHLORIDE 0.9% FLUSH: 3.0000 mL | INTRAVENOUS | Status: DC | PRN

## 2023-12-20 MED ORDER — NITROGLYCERIN 0.4 MG SL SUBL
0.4000 mg | SUBLINGUAL_TABLET | SUBLINGUAL | Status: DC | PRN
Start: 2023-12-20 — End: 2023-12-21

## 2023-12-20 MED ORDER — FENTANYL CITRATE (PF) 100 MCG/2ML IJ SOLN
INTRAMUSCULAR | Status: AC
  Filled 2023-12-20: qty 2

## 2023-12-20 MED ORDER — POLYVINYL ALCOHOL 1.4 % OP SOLN: 1.0000 [drp] | OPHTHALMIC | Status: DC | PRN

## 2023-12-20 MED ORDER — KETAMINE HCL 50 MG/5ML IJ SOSY: PREFILLED_SYRINGE | INTRAMUSCULAR | Status: DC | PRN

## 2023-12-20 MED ORDER — METHOCARBAMOL 500 MG PO TABS
500.0000 mg | ORAL_TABLET | Freq: Four times a day (QID) | ORAL | Status: DC | PRN
  Administered 2023-12-20 – 2023-12-21 (×2): 500 mg via ORAL
  Filled 2023-12-20: qty 1

## 2023-12-20 MED ORDER — METHOCARBAMOL 1000 MG/10ML IJ SOLN: 500.0000 mg | Freq: Four times a day (QID) | INTRAMUSCULAR | Status: DC | PRN

## 2023-12-20 MED ORDER — KETAMINE HCL 10 MG/ML IJ SOLN
INTRAMUSCULAR | Status: DC | PRN
Start: 2023-12-20 — End: 2023-12-20
  Administered 2023-12-20: 20 mg via INTRAVENOUS
  Administered 2023-12-20: 30 mg via INTRAVENOUS

## 2023-12-20 MED ORDER — ROCURONIUM BROMIDE 10 MG/ML (PF) SYRINGE
PREFILLED_SYRINGE | INTRAVENOUS | Status: AC
  Filled 2023-12-20: qty 10

## 2023-12-20 MED ORDER — HYDROMORPHONE HCL 1 MG/ML IJ SOLN
0.2500 mg | INTRAMUSCULAR | Status: DC | PRN
  Administered 2023-12-20 (×4): 0.5 mg via INTRAVENOUS

## 2023-12-20 MED ORDER — SODIUM CHLORIDE 0.9 % IV SOLN: 250.0000 mL | INTRAVENOUS | Status: DC

## 2023-12-20 MED ORDER — CEFAZOLIN SODIUM-DEXTROSE 2-4 GM/100ML-% IV SOLN
2.0000 g | INTRAVENOUS | Status: AC
  Administered 2023-12-20: 2 g via INTRAVENOUS
  Filled 2023-12-20: qty 100

## 2023-12-20 MED ORDER — DEXAMETHASONE SOD PHOSPHATE PF 10 MG/ML IJ SOLN
INTRAMUSCULAR | Status: DC | PRN
  Administered 2023-12-20: 10 mg via INTRAVENOUS

## 2023-12-20 MED ORDER — ISOSORBIDE MONONITRATE ER 30 MG PO TB24
30.0000 mg | ORAL_TABLET | Freq: Every morning | ORAL | Status: DC
  Filled 2023-12-20: qty 1

## 2023-12-20 MED ORDER — ONDANSETRON HCL 4 MG PO TABS: 4.0000 mg | ORAL_TABLET | Freq: Four times a day (QID) | ORAL | Status: DC | PRN

## 2023-12-20 MED ORDER — MIDAZOLAM HCL 2 MG/2ML IJ SOLN
INTRAMUSCULAR | Status: AC
  Filled 2023-12-20: qty 2

## 2023-12-20 MED ORDER — 0.9 % SODIUM CHLORIDE (POUR BTL) OPTIME
TOPICAL | Status: DC | PRN
  Administered 2023-12-20: 1000 mL

## 2023-12-20 MED ORDER — CHOLESTYRAMINE 4 G PO PACK
1.0000 | PACK | Freq: Every day | ORAL | Status: DC
  Administered 2023-12-21: 1 via ORAL
  Filled 2023-12-20: qty 1

## 2023-12-20 MED ORDER — CEFAZOLIN SODIUM-DEXTROSE 1-4 GM/50ML-% IV SOLN
1.0000 g | Freq: Three times a day (TID) | INTRAVENOUS | Status: AC
  Administered 2023-12-20 – 2023-12-21 (×2): 1 g via INTRAVENOUS
  Filled 2023-12-20 (×2): qty 50

## 2023-12-20 MED ORDER — ORAL CARE MOUTH RINSE: 15.0000 mL | Freq: Once | OROMUCOSAL | Status: AC

## 2023-12-20 MED ORDER — SODIUM CHLORIDE 0.9% FLUSH
3.0000 mL | Freq: Two times a day (BID) | INTRAVENOUS | Status: DC
Start: 2023-12-20 — End: 2023-12-21
  Administered 2023-12-20: 3 mL via INTRAVENOUS

## 2023-12-20 MED ORDER — SACUBITRIL-VALSARTAN 24-26 MG PO TABS
1.0000 | ORAL_TABLET | Freq: Two times a day (BID) | ORAL | Status: DC
  Filled 2023-12-20 (×2): qty 1

## 2023-12-20 MED ORDER — THROMBIN 20000 UNITS EX SOLR
CUTANEOUS | Status: AC
  Filled 2023-12-20: qty 20000

## 2023-12-20 MED ORDER — ALPRAZOLAM 0.5 MG PO TABS
1.0000 mg | ORAL_TABLET | Freq: Four times a day (QID) | ORAL | Status: DC
  Administered 2023-12-20 – 2023-12-21 (×3): 1 mg via ORAL
  Filled 2023-12-20 (×2): qty 2
  Filled 2023-12-20: qty 1

## 2023-12-20 MED ORDER — BISACODYL 10 MG RE SUPP: 10.0000 mg | Freq: Every day | RECTAL | Status: DC | PRN

## 2023-12-20 MED ORDER — MENTHOL 3 MG MT LOZG: 1.0000 | LOZENGE | OROMUCOSAL | Status: DC | PRN

## 2023-12-20 MED ORDER — ZOLPIDEM TARTRATE 5 MG PO TABS
10.0000 mg | ORAL_TABLET | Freq: Every day | ORAL | Status: DC
  Administered 2023-12-20: 10 mg via ORAL
  Filled 2023-12-20 (×2): qty 2

## 2023-12-20 MED ORDER — ACETAMINOPHEN 10 MG/ML IV SOLN
INTRAVENOUS | Status: AC
  Filled 2023-12-20: qty 100

## 2023-12-20 MED ORDER — ONDANSETRON HCL 4 MG/2ML IJ SOLN
INTRAMUSCULAR | Status: AC
  Filled 2023-12-20: qty 2

## 2023-12-20 MED ORDER — HYDROMORPHONE HCL 1 MG/ML IJ SOLN
INTRAMUSCULAR | Status: DC | PRN
  Administered 2023-12-20: .5 mg via INTRAVENOUS

## 2023-12-20 MED ORDER — METHOCARBAMOL 500 MG PO TABS
ORAL_TABLET | ORAL | Status: AC
  Filled 2023-12-20: qty 1

## 2023-12-20 MED ORDER — OXYCODONE HCL 5 MG PO TABS
10.0000 mg | ORAL_TABLET | ORAL | Status: DC | PRN
  Administered 2023-12-20 – 2023-12-21 (×5): 10 mg via ORAL
  Filled 2023-12-20 (×4): qty 2

## 2023-12-20 MED ORDER — BUPIVACAINE HCL (PF) 0.25 % IJ SOLN
INTRAMUSCULAR | Status: DC | PRN
  Administered 2023-12-20: 30 mL

## 2023-12-20 MED ORDER — ACETAMINOPHEN 10 MG/ML IV SOLN
1000.0000 mg | Freq: Once | INTRAVENOUS | Status: DC | PRN
  Administered 2023-12-20: 1000 mg via INTRAVENOUS

## 2023-12-20 MED ORDER — PROPOFOL 10 MG/ML IV BOLUS
INTRAVENOUS | Status: AC
  Filled 2023-12-20: qty 20

## 2023-12-20 MED ORDER — SUGAMMADEX SODIUM 200 MG/2ML IV SOLN
INTRAVENOUS | Status: DC | PRN
  Administered 2023-12-20: 200 mg via INTRAVENOUS

## 2023-12-20 MED ORDER — THROMBIN 20000 UNITS EX SOLR
CUTANEOUS | Status: DC | PRN
  Administered 2023-12-20: 10 mL via TOPICAL

## 2023-12-20 MED ORDER — FLEET ENEMA RE ENEM
1.0000 | ENEMA | Freq: Once | RECTAL | Status: DC | PRN
Start: 2023-12-20 — End: 2023-12-21

## 2023-12-20 MED ORDER — HYDROCODONE-ACETAMINOPHEN 10-325 MG PO TABS
1.0000 | ORAL_TABLET | ORAL | Status: DC | PRN
  Filled 2023-12-20: qty 1

## 2023-12-20 MED ORDER — HYDROMORPHONE HCL 1 MG/ML IJ SOLN
1.0000 mg | INTRAMUSCULAR | Status: DC | PRN
  Administered 2023-12-21: 1 mg via INTRAVENOUS
  Filled 2023-12-20: qty 1

## 2023-12-20 MED ORDER — KETAMINE HCL 50 MG/5ML IJ SOSY
PREFILLED_SYRINGE | INTRAMUSCULAR | Status: AC
  Filled 2023-12-20: qty 5

## 2023-12-20 MED ORDER — ACETAMINOPHEN 650 MG RE SUPP: 650.0000 mg | RECTAL | Status: DC | PRN

## 2023-12-20 MED ORDER — BUPIVACAINE HCL (PF) 0.25 % IJ SOLN
INTRAMUSCULAR | Status: AC
  Filled 2023-12-20: qty 30

## 2023-12-20 MED ORDER — CHLORHEXIDINE GLUCONATE 0.12 % MT SOLN
15.0000 mL | Freq: Once | OROMUCOSAL | Status: AC
  Administered 2023-12-20: 15 mL via OROMUCOSAL
  Filled 2023-12-20: qty 15

## 2023-12-20 MED ORDER — LABETALOL HCL 5 MG/ML IV SOLN
INTRAVENOUS | Status: DC | PRN
  Administered 2023-12-20: 7.5 mg via INTRAVENOUS

## 2023-12-20 MED ORDER — HYDRALAZINE HCL 20 MG/ML IJ SOLN
10.0000 mg | INTRAMUSCULAR | Status: AC | PRN
  Administered 2023-12-20 (×2): 10 mg via INTRAVENOUS

## 2023-12-20 MED ORDER — ACETAMINOPHEN 325 MG PO TABS: 650.0000 mg | ORAL_TABLET | ORAL | Status: DC | PRN

## 2023-12-20 MED ORDER — AMISULPRIDE (ANTIEMETIC) 5 MG/2ML IV SOLN: 10.0000 mg | Freq: Once | INTRAVENOUS | Status: DC | PRN

## 2023-12-20 MED ORDER — DULOXETINE HCL 60 MG PO CPEP: 120.0000 mg | ORAL_CAPSULE | Freq: Every day | ORAL | Status: DC

## 2023-12-20 MED ORDER — ONDANSETRON HCL 4 MG/2ML IJ SOLN: 4.0000 mg | Freq: Once | INTRAMUSCULAR | Status: DC | PRN

## 2023-12-20 MED ORDER — DICLOFENAC-MISOPROSTOL 50-0.2 MG PO TBEC: 1.0000 | DELAYED_RELEASE_TABLET | Freq: Every day | ORAL | Status: DC

## 2023-12-20 MED ORDER — POLYETHYLENE GLYCOL 3350 17 G PO PACK: 17.0000 g | PACK | Freq: Every day | ORAL | Status: DC | PRN

## 2023-12-20 MED ORDER — ONDANSETRON HCL 4 MG/2ML IJ SOLN
INTRAMUSCULAR | Status: DC | PRN
  Administered 2023-12-20: 4 mg via INTRAVENOUS

## 2023-12-20 MED ORDER — VANCOMYCIN HCL 1000 MG IV SOLR
INTRAVENOUS | Status: AC
  Filled 2023-12-20: qty 20

## 2023-12-20 MED ORDER — DEXMEDETOMIDINE HCL IN NACL 80 MCG/20ML IV SOLN
INTRAVENOUS | Status: AC
  Filled 2023-12-20: qty 20

## 2023-12-20 MED ORDER — LIDOCAINE 2% (20 MG/ML) 5 ML SYRINGE
INTRAMUSCULAR | Status: AC
  Filled 2023-12-20: qty 5

## 2023-12-20 MED ORDER — PHENOL 1.4 % MT LIQD: 1.0000 | OROMUCOSAL | Status: DC | PRN

## 2023-12-20 MED ORDER — LIDOCAINE 2% (20 MG/ML) 5 ML SYRINGE
INTRAMUSCULAR | Status: DC | PRN
  Administered 2023-12-20: 60 mg via INTRAVENOUS

## 2023-12-20 MED ORDER — FENTANYL CITRATE (PF) 100 MCG/2ML IJ SOLN
25.0000 ug | INTRAMUSCULAR | Status: DC | PRN
  Administered 2023-12-20 (×3): 50 ug via INTRAVENOUS

## 2023-12-20 MED ORDER — VANCOMYCIN HCL 1000 MG IV SOLR
INTRAVENOUS | Status: DC | PRN
Start: 2023-12-20 — End: 2023-12-20
  Administered 2023-12-20: 1000 mg via TOPICAL

## 2023-12-20 SURGICAL SUPPLY — 41 items
BAG COUNTER SPONGE SURGICOUNT (BAG) ×1 IMPLANT
BENZOIN TINCTURE PRP APPL 2/3 (GAUZE/BANDAGES/DRESSINGS) ×1 IMPLANT
BLADE BONE MILL MEDIUM (MISCELLANEOUS) ×1 IMPLANT
BUR MATCHSTICK NEURO 3.0 LAGG (BURR) ×1 IMPLANT
CAGE EXP CATALYFT 9 (Plate) IMPLANT
CANISTER SUCTION 3000ML PPV (SUCTIONS) ×1 IMPLANT
CAP LOCK SPINE RADIUS (Cap) IMPLANT
CNTNR URN SCR LID CUP LEK RST (MISCELLANEOUS) ×1 IMPLANT
COVER BACK TABLE 60X90IN (DRAPES) ×1 IMPLANT
DERMABOND ADVANCED .7 DNX12 (GAUZE/BANDAGES/DRESSINGS) ×1 IMPLANT
DRAPE C-ARM 42X72 X-RAY (DRAPES) ×2 IMPLANT
DRAPE LAPAROTOMY 100X72X124 (DRAPES) ×1 IMPLANT
DRAPE SURG 17X23 STRL (DRAPES) ×4 IMPLANT
DRSG OPSITE POSTOP 4X6 (GAUZE/BANDAGES/DRESSINGS) ×1 IMPLANT
DURAPREP 26ML APPLICATOR (WOUND CARE) ×1 IMPLANT
ELECTRODE REM PT RTRN 9FT ADLT (ELECTROSURGICAL) ×1 IMPLANT
GLOVE BIO SURGEON STRL SZ 6.5 (GLOVE) ×1 IMPLANT
GLOVE BIOGEL PI IND STRL 6.5 (GLOVE) ×1 IMPLANT
GLOVE ECLIPSE 9.0 STRL (GLOVE) ×2 IMPLANT
GOWN STRL REUS W/ TWL XL LVL3 (GOWN DISPOSABLE) ×2 IMPLANT
KIT BASIN OR (CUSTOM PROCEDURE TRAY) ×1 IMPLANT
KIT TURNOVER KIT B (KITS) ×1 IMPLANT
NDL HYPO 22X1.5 SAFETY MO (MISCELLANEOUS) ×1 IMPLANT
NEEDLE HYPO 22X1.5 SAFETY MO (MISCELLANEOUS) ×1 IMPLANT
PACK LAMINECTOMY NEURO (CUSTOM PROCEDURE TRAY) ×1 IMPLANT
PENCIL BUTTON HOLSTER BLD 10FT (ELECTRODE) ×1 IMPLANT
PUTTY GRAFTON DBF 6CC W/DELIVE (Putty) IMPLANT
ROD 40MM SOLERA PREBENT (Rod) IMPLANT
SCREW SOLERA 45X6.5XMA NS SPNE (Screw) IMPLANT
SET SCREW SPINE 5.5X6 TI (Screw) IMPLANT
SOLN 0.9% NACL POUR BTL 1000ML (IV SOLUTION) ×1 IMPLANT
SOLN STERILE WATER BTL 1000 ML (IV SOLUTION) ×1 IMPLANT
SPIKE FLUID TRANSFER (MISCELLANEOUS) ×1 IMPLANT
SPONGE SURGIFOAM ABS GEL 100 (HEMOSTASIS) ×1 IMPLANT
STRIP CLOSURE SKIN 1/2X4 (GAUZE/BANDAGES/DRESSINGS) ×2 IMPLANT
SUT VIC AB 0 CT1 18XCR BRD8 (SUTURE) ×2 IMPLANT
SUT VIC AB 2-0 CT1 18 (SUTURE) ×1 IMPLANT
SUT VIC AB 3-0 SH 8-18 (SUTURE) ×2 IMPLANT
TOWEL GREEN STERILE (TOWEL DISPOSABLE) ×1 IMPLANT
TOWEL GREEN STERILE FF (TOWEL DISPOSABLE) ×1 IMPLANT
TRAY FOLEY MTR SLVR 16FR STAT (SET/KITS/TRAYS/PACK) ×1 IMPLANT

## 2023-12-20 NOTE — Brief Op Note (Signed)
 12/20/2023  4:38 PM  PATIENT:  Camellia JINNY Mon  68 y.o. male  PRE-OPERATIVE DIAGNOSIS:  Spondylolisthesis, lumbosacral region  POST-OPERATIVE DIAGNOSIS:  Spondylolisthesis, lumbosacral region  PROCEDURE:  Procedure(s) with comments: POSTERIOR LUMBAR INTERBODY FUSION LUMBAR FOUR-FIVE; POSTERIOR LATERAL AND INTERBODY FUSION (N/A) - POSTERIOR LUMBAR INTERBODY FUSION LUMBAR FOUR-FIVE; POSTERIOR LATERAL AND INTERBODY FUSION  SURGEON:  Surgeons and Role:    DEWAINE Louis Shove, MD - Primary  PHYSICIAN ASSISTANT:   ASSISTANTSBETHA Jennetta PIETY   ANESTHESIA:   general  EBL:  250 mL   BLOOD ADMINISTERED:none  DRAINS: none   LOCAL MEDICATIONS USED:  MARCAINE      SPECIMEN:  No Specimen  DISPOSITION OF SPECIMEN:  N/A  COUNTS:  YES  TOURNIQUET:  * No tourniquets in log *  DICTATION: .Dragon Dictation  PLAN OF CARE: Admit for overnight observation  PATIENT DISPOSITION:  PACU - hemodynamically stable.   Delay start of Pharmacological VTE agent (>24hrs) due to surgical blood loss or risk of bleeding: yes

## 2023-12-20 NOTE — Progress Notes (Signed)
 Orthopedic Tech Progress Note Patient Details:  Max Byrd [birth year] [id number]  PACU RN stated per patient, he has his back brace    Patient ID: Max Byrd, male   DOB: [birth year], 68 y.o.   MRN: [id number]  Delanna LITTIE Pac 12/20/2023, 6:17 PM

## 2023-12-20 NOTE — Anesthesia Procedure Notes (Signed)
 Procedure Name: Intubation Date/Time: 12/20/2023 2:12 PM  Performed by: Christopher Comings, CRNAPre-anesthesia Checklist: Patient identified, Emergency Drugs available, Suction available and Patient being monitored Patient Re-evaluated:Patient Re-evaluated prior to induction Oxygen Delivery Method: Circle system utilized Preoxygenation: Pre-oxygenation with 100% oxygen Induction Type: IV induction Ventilation: Mask ventilation without difficulty Laryngoscope Size: Mac and 4 Grade View: Grade I Tube type: Oral Tube size: 7.5 mm Number of attempts: 1 Airway Equipment and Method: Stylet and Oral airway Placement Confirmation: ETT inserted through vocal cords under direct vision, positive ETCO2 and breath sounds checked- equal and bilateral Secured at: 22 cm Tube secured with: Tape Dental Injury: Teeth and Oropharynx as per pre-operative assessment

## 2023-12-20 NOTE — Transfer of Care (Signed)
 Immediate Anesthesia Transfer of Care Note  Patient: Max Byrd  Procedure(s) Performed: POSTERIOR LUMBAR INTERBODY FUSION LUMBAR FOUR-FIVE; POSTERIOR LATERAL AND INTERBODY FUSION (Back)  Patient Location: PACU  Anesthesia Type:General  Level of Consciousness: awake, alert , and oriented  Airway & Oxygen Therapy: Patient Spontanous Breathing  Post-op Assessment: Report given to RN and Post -op Vital signs reviewed and stable  Post vital signs: Reviewed and stable  Last Vitals:  Vitals Value Taken Time  BP 169/117 12/20/23 16:53  Temp 37.1 C 12/20/23 16:51  Pulse 94 12/20/23 17:00  Resp 29 12/20/23 17:00  SpO2 100 % 12/20/23 17:00  Vitals shown include unfiled device data.  Last Pain:  Vitals:   12/20/23 1700  TempSrc:   PainSc: 10-Worst pain ever         Complications: There were no known notable events for this encounter.

## 2023-12-20 NOTE — Op Note (Signed)
 Date of procedure: 12/20/2023  Date of dictation: Same  Service: Neurosurgery  Preoperative diagnosis: L4-5 adjacent level degeneration with spondylolisthesis and stenosis with radiculopathy  Postoperative diagnosis: Same  Procedure Name: L4-5 bilateral decompressive laminotomies and foraminotomies, more than would be required for simple interbody fusion alone.  L4-5 posterior lumbar interbody fusion utilizing interbody cages, local harvested autograft, and morselized allograft  L4-5 posterior right arthrodesis utilizing nonsegmental pedicle screw fixation and local autografting.  Reexploration of L5-S1 fusion with removal of hardware  Surgeon:Silvester Reierson A.Geniece Akers, M.D.  Asst. Surgeon: Jennetta, NP  Anesthesia: General  Indication: 68 year old male with severe back and left lower extremity pain weakness and sensory loss.  Patient remotely status post L5-S1 posterior fusion for lytic spondylolisthesis.  Patient with severe disruption of his L4-5 disc space with leftward L4-5 superiorly migrated disc herniation and new development of grade 2 spondylolisthesis at L4-5.  Patient presents now for decompression and fusion.  Operative note: After induction of anesthesia, patient positioned prone on the Wilson frame and properly padded.  Lumbar region prepped and draped sterilely.  Incision made overlying L4-5 and S1.  Dissection performed bilaterally.  Retractor placed.  Fluoroscopy used.  Levels confirmed.  Previously placed pedicle screw instrumentation at L5-S1 was dissected free.  The fusion was inspected and found to be quite solid.  The screws were disassembled and the rods were removed.  The screws and S1 were removed.  The screws and L5 were left in place.  A short segment of distal S1 screw on the right was fractured and left in place.  Once again fusion was inspected and found to be quite solid at L5-S1.  Attention placed to the L4-5 level.  Decompressive laminotomies and facetectomies were then  performed using Leksell rongeurs, Kerrison rongeurs and the high-speed drill to remove the inferior two thirds of the lamina of L4 bilaterally.  Complete inferior facetectomies including the pars interarticularis was performed bilaterally at L4.  Superior articular processes of L5 were resected.  There was no residual lamina to remove at L5.  Ligament flavum and epidural scar were elevated and resected.  Bilateral discectomy was then performed at L4-5 including removal of a large superior foraminal disc herniation on the left.  The space then prepared for interbody fusion.  The space was sequentially dilated and a 10 mm dilator was left in place on the right side.  The space prepared for interbody fusion on the left.  Soft tissue from in the interspace.  Endplates thoroughly prepared.  A 9 mm Medtronic expandable cage was then impacted into place and expanded.  Distractor removed patient's right side.  The space prepared on the right side.  Morselized autograft packed in the interspace.  A second cage was then impacted into place and expanded.  Pedicles of L4 were identified using surface landmarks and intraoperative fluoroscopy superficial bone overlying the pedicle was then removed using high-speed drill.  Pedicle was then probed using a pedicle awl.  Each pedicle tract was probed and found to be solidly within the bone.  Each pedicle tract was then tapped with a screw tap.  Each screw tap hole was probed and found to be solidly within bone.  6.5 mm Solera screws from Medtronic were placed bilaterally at L4.  The L5 screws were Stryker radius and these were left in place.  Final imaging revealed good position of the cages and the hardware at the proper level with normal alignment of the spine.  Each cage was then packed with demineralized bone  fibers.  Short segment titanium rods and posterior the screws at L4 and L5.  Locking caps placed over the screws and locking caps then engaged.  Transverse processes were  decorticated.  Morselized bone was packed posterior laterally for later fusion.  Gelfoam was placed over the laminotomy defects.  Vancomycin  powder was placed in the deep wound space.  Wounds and closed in layers with Vicryl sutures.  Steri-Strips and sterile dressing were applied.  No apparent complications.  Patient tolerated the procedure well and he returns to the recovery room postop.

## 2023-12-20 NOTE — H&P (Signed)
 Max Byrd is an 68 y.o. male.   Chief Complaint: Back pain HPI: 68 year old male with remote history of L5-S1 decompression and fusion presents with severe worsening back pain and bilateral lower extremity symptoms left greater than right.  Workup demonstrates evidence of disc disruption with new spondylolisthesis at L4-5 with significant leftward disc herniation extending into the left L4-5 foramen.  Patient has failed conservative management presents now for L4-5 decompression and fusion in hopes of improving his symptoms.  Past Medical History:  Diagnosis Date   Anginal pain 2015   since valve was done   Anxiety    Arthritis    left pointer finger; forminal openings bilaterally (08/10/2013)   Bicuspid aortic valve    a. 01/2013 s/p AVR - 21mm Edwards 3300 TFX pericardial tissue valve, ser # [id number].   CKD (chronic kidney disease) stage 2, GFR 60-89 ml/min    Coronary atherosclerosis of native coronary artery    a. 01/2013 CABGx3: LIMA->LAD, VG->Diag, VG->OM (performed @ time of AVR),  occluded SVG-OM2 , now with 95% stenosis at the anastomosis site on OM 2 - PCI attempt 07/28/13 unsuccessful - med Rx cont'd    Depression    Erectile dysfunction    INJECTIONS OF PROSTAGLANDIN BY UROLGIST   Essential hypertension, benign    GERD (gastroesophageal reflux disease)    H/O Bell's palsy 2010 X2   Heart murmur    History of blood transfusion    related to stomach bleeding from ASA & NSAIDS   History of stomach ulcers    Hyperlipidemia    IBS (irritable bowel syndrome)    Migraine    went away in the 1990's   Pain, chronic postoperative    S/P LEFT ARM SURGERY   Renal cell carcinoma of right kidney (HCC) 2003    Past Surgical History:  Procedure Laterality Date   ANAL SPHINCTEROTOMY  04/13/03   DR. GRAPEY   AORTIC VALVE REPLACEMENT N/A 02/14/2013   Procedure: AORTIC VALVE REPLACEMENT (AVR);  Surgeon: Maude Fleeta Ochoa, MD;  Location: [facility] OR;  Service: Open Heart Surgery;   Laterality: N/A;   BUNIONECTOMY     right  02/2009   CARDIAC CATHETERIZATION  2010; 6/12015; 07/29/2013   CARDIAC VALVE REPLACEMENT     COLONOSCOPY  2008   DIVERTICULOSIS   CORONARY ANGIOPLASTY WITH STENT PLACEMENT  08/10/2013   1   CORONARY ARTERY BYPASS GRAFT N/A 02/14/2013   Procedure: CORONARY ARTERY BYPASS GRAFTING (CABG);  Surgeon: Maude Fleeta Ochoa, MD;  Location[facility] OR;  Service: Open Heart Surgery;  Laterality: N/A;   ELBOW SURGERY Left X 2   ELBOW SURGERY Right X 2   ELBOW SURGERY     left  2009   EXCISIONAL HEMORRHOIDECTOMY     HERNIA REPAIR Left 1957   HERNIA REPAIR     06/21/17   INTRAOPERATIVE TRANSESOPHAGEAL ECHOCARDIOGRAM N/A 02/14/2013   Procedure: INTRAOPERATIVE TRANSESOPHAGEAL ECHOCARDIOGRAM;  Surgeon: Maude Fleeta Ochoa, MD;  Location: [facility] OR;  Service: Open Heart Surgery;  Laterality: N/A;   LATERAL EPICONDYLE RELEASE Left 07/29/07   DR. GRAVES   LEFT AND RIGHT HEART CATHETERIZATION WITH CORONARY ANGIOGRAM N/A 01/27/2013   Procedure: LEFT AND RIGHT HEART CATHETERIZATION WITH CORONARY ANGIOGRAM;  Surgeon: Victory LELON Claudene DOUGLAS, MD;  Location: [facility] CATH LAB;  Service: Cardiovascular;  Laterality: N/A;   LEFT HEART CATHETERIZATION WITH CORONARY/GRAFT ANGIOGRAM N/A 07/28/2013   Procedure: LEFT HEART CATHETERIZATION WITH EL BILE;  Surgeon: Alm LELON Clay, MD;  Location: [facility] CATH LAB;  Service: Cardiovascular;  Laterality: N/A;   PARTIAL NEPHRECTOMY  08/24/02   RIGHT...DR. ALLINE   PERCUTANEOUS STENT INTERVENTION N/A 08/10/2013   Procedure: PERCUTANEOUS STENT INTERVENTION;  Surgeon: Victory LELON Claudene DOUGLAS, MD;  Location: [facility] CATH LAB;  Service: Cardiovascular;  Laterality: N/A;   TOE SURGERY Right    shaved; wired back straight   TONSILLECTOMY      Family History  Problem Relation Age of Onset   Cancer Mother        BREAST/OVARIAN   Heart disease Father        S/P MI/CABG   Social History:  reports that he quit smoking about 47 years ago. His smoking use included  cigarettes. He started smoking about 54 years ago. He has a 9.4 pack-year smoking history. He has never been exposed to tobacco smoke. His smokeless tobacco use includes snuff. He reports that he does not currently use alcohol. He reports that he does not use drugs.  Allergies:  Allergies  Allergen Reactions   Aspirin  Other (See Comments)    H/O BLEEDING ULCER   Nsaids Other (See Comments)    Stomach bleeding   Tolmetin Other (See Comments)    Stomach bleeding   Klonopin [Clonazepam] Other (See Comments)    Erectile dysfunction   Buspirone Hcl     Other Reaction(s): intolerant   Clindamycin Hcl     Other Reaction(s): stomach upset   Penicillins Nausea And Vomiting and Other (See Comments)    Tolerates ancef    Reglan [Metoclopramide] Anxiety    EXCITABILITY     Medications Prior to Admission  Medication Sig Dispense Refill   ALPRAZolam  (XANAX ) 1 MG tablet Take 1 tablet (1 mg total) by mouth 4 (four) times daily. 120 tablet 2   amLODipine  (NORVASC ) 5 MG tablet Take 1 tablet (5 mg total) by mouth daily. 90 tablet 3   carisoprodol  (SOMA ) 350 MG tablet Take 175 mg by mouth 2 (two) times daily. (Patient taking differently: Take 175 mg by mouth daily.)     cholestyramine (QUESTRAN) 4 g packet Take 1 packet by mouth daily with breakfast.     Diclofenac -miSOPROStol  50-0.2 MG TBEC Take 1 tablet by mouth daily.     DULoxetine  (CYMBALTA ) 60 MG capsule Take 2 capsules (120 mg total) by mouth daily. 60 capsule 5   ENTRESTO 24-26 MG Take 1 tablet by mouth 2 (two) times daily.     furosemide  (LASIX ) 40 MG tablet Take 40 mg by mouth 2 (two) times daily as needed for fluid or edema.     hyoscyamine  (LEVSIN  SL) 0.125 MG SL tablet Take 0.25 mg by mouth every 6 (six) hours as needed (IBS).      Hyprom-Naphaz-Polysorb-Zn Sulf (CLEAR EYES COMPLETE) SOLN Place 1 drop into both eyes as needed (dry eyes).     isosorbide  mononitrate (IMDUR ) 30 MG 24 hr tablet Take 30 mg by mouth every morning.     morphine   (MS CONTIN ) 15 MG 12 hr tablet Take 15 mg by mouth 2 (two) times daily.     nitroGLYCERIN  (NITROSTAT ) 0.4 MG SL tablet Place 1 tablet (0.4 mg total) under the tongue every 5 (five) minutes as needed for chest pain (MAX 3 TABLETS). 25 tablet 11   oxyCODONE -acetaminophen  (PERCOCET) 10-325 MG tablet Take 1 tablet by mouth every 8 (eight) hours.     sertraline  (ZOLOFT ) 100 MG tablet Take 2 tablets (200 mg total) by mouth daily. (Patient taking differently: Take 200 mg by mouth at bedtime.) 60 tablet 5  testosterone cypionate (DEPOTESTOSTERONE CYPIONATE) 200 MG/ML injection  (Patient taking differently: Inject 1 mL into the muscle every 14 (fourteen) days.)  3   zolpidem  (AMBIEN ) 10 MG tablet Take 1 tablet (10 mg total) by mouth at bedtime. 30 tablet 2    No results found for this or any previous visit (from the past 48 hours). No results found.  Pertinent items noted in HPI and remainder of comprehensive ROS otherwise negative.  Blood pressure (!) 148/98, pulse 84, temperature 98.1 F (36.7 C), temperature source Oral, resp. rate 18, height 5' 6 (1.676 m), weight 64.4 kg, SpO2 98%.  Patient is awake and alert.  He is oriented and appropriate.  Speech is fluent.  Judgment insight are intact.  Cranial nerve function normal bilateral.  Motor examination feels weakness in his left quadriceps muscle group grading out of 4 - or 5.  Patient with mild left dorsiflexion weakness.  Otherwise motor strength intact.  Examination of his sensory function reveals decrease sensation pinprick and light touch in his left L4 and L5 dermatomes.  Deep tendon reflexes normal active.  Gait antalgic.  Posture mildly flexed.  Examination head ears eyes nose throat is unremarkable chest and abdomen are benign.  Extremities are free of major deformity. Assessment/Plan L4-5 adjacent segment disease with spondylolisthesis and disc herniation.  Plan L4-5 bilateral decompressive laminotomies and foraminotomies followed by  posterior lumbar interbody fusion utilizing interbody cages, low Kirtida autograft, and augment with posterior pedicle screw fixation and local autografting.  Risks and benefits been explained.  Patient wishes to proceed.  [facility] LABOR Darshay Deupree 12/20/2023, 9:56 AM

## 2023-12-21 DIAGNOSIS — M4316 Spondylolisthesis, lumbar region: Secondary | ICD-10-CM | POA: Diagnosis not present

## 2023-12-21 MED ORDER — OXYCODONE HCL 10 MG PO TABS: 10.0000 mg | ORAL_TABLET | ORAL | 0 refills | Status: AC | PRN

## 2023-12-21 MED ORDER — METHOCARBAMOL 500 MG PO TABS: 500.0000 mg | ORAL_TABLET | Freq: Four times a day (QID) | ORAL | 1 refills | Status: AC | PRN

## 2023-12-21 MED ORDER — OXYCODONE HCL 5 MG PO TABS: 5.0000 mg | ORAL_TABLET | ORAL | Status: DC | PRN

## 2023-12-21 NOTE — Evaluation (Signed)
 Physical Therapy Evaluation and Discharge  Patient Details Name: Max Byrd MRN: [id number] DOB: [birth year] Today's Date: 12/21/2023  History of Present Illness  Pt is a 68 y.o male admitted 11/3 for scheduled PLIF L4-L5. PMH: arthritis, CKD, HTN, bell's palsy, HLD  Clinical Impression  Pt admitted with above. Pt c/o 8/10 back pain but mobilizing at supervision level. Pt able to amb 200' without AD and able to negotiate 3 stairs with R HHA to safely enter home. Pt able to recall and adhere to back precautions. Wife with good return demonstration on how to assist pt on stairs safely. Pt with no further acute PT needs at this time. PT SIGNING OFF. Please re-consult if needed in future.        If plan is discharge home, recommend the following: A little help with bathing/dressing/bathroom;Assist for transportation;Help with stairs or ramp for entrance   Can travel by private vehicle        Equipment Recommendations None recommended by PT  Recommendations for Other Services       Functional Status Assessment Patient has had a recent decline in their functional status and demonstrates the ability to make significant improvements in function in a reasonable and predictable amount of time.     Precautions / Restrictions Precautions Precautions: Fall;Back Precaution Booklet Issued: Yes (comment) Recall of Precautions/Restrictions: Intact Required Braces or Orthoses: Spinal Brace Spinal Brace: Lumbar corset Restrictions Weight Bearing Restrictions Per Provider Order: No      Mobility  Bed Mobility Overal bed mobility: Modified Independent             General bed mobility comments: pt return demonstrated safe sidelying to EOB transfer with verbal cues    Transfers Overall transfer level: Needs assistance Equipment used: None Transfers: Sit to/from Stand Sit to Stand: Supervision           General transfer comment: increased time, guarded     Ambulation/Gait Ambulation/Gait assistance: Contact guard assist Gait Distance (Feet): 200 Feet Assistive device: None Gait Pattern/deviations: Step-through pattern, Decreased stride length, Trunk flexed Gait velocity: dec Gait velocity interpretation: 1.31 - 2.62 ft/sec, indicative of limited community ambulator   General Gait Details: mild trunk flexion, pt reports onset of fatigue, no LOB or noted instability  Stairs Stairs: Yes Stairs assistance: Min assist Stair Management: Step to pattern (HHA) Number of Stairs: 3 General stair comments: provided R HHA to mimic home set up as patient without handrails. Pt's wife returned demonstrated good technique.  Wheelchair Mobility     Tilt Bed    Modified Rankin (Stroke Patients Only)       Balance Overall balance assessment: Mild deficits observed, not formally tested                                           Pertinent Vitals/Pain Pain Assessment Pain Assessment: 0-10 Pain Score: 8  Pain Location: back, radiating down L LE Pain Descriptors / Indicators: Discomfort, Grimacing Pain Intervention(s): Monitored during session    Home Living Family/patient expects to be discharged to:: Private residence Living Arrangements: Spouse/significant other Available Help at Discharge: Family;Available PRN/intermittently Type of Home: House Home Access: Stairs to enter Entrance Stairs-Rails: None Entrance Stairs-Number of Steps: 3   Home Layout: One level Home Equipment: None      Prior Function Prior Level of Function : Independent/Modified Independent;History of Falls (last six months)  Mobility Comments: No AD ADLs Comments: Ind     Extremity/Trunk Assessment   Upper Extremity Assessment Upper Extremity Assessment: Overall WFL for tasks assessed    Lower Extremity Assessment Lower Extremity Assessment: Overall WFL for tasks assessed    Cervical / Trunk Assessment Cervical /  Trunk Assessment: Back Surgery  Communication   Communication Communication: No apparent difficulties    Cognition Arousal: Alert Behavior During Therapy: WFL for tasks assessed/performed                             Following commands: Intact       Cueing Cueing Techniques: Verbal cues     General Comments General comments (skin integrity, edema, etc.): VSS, incision covered by dressing    Exercises     Assessment/Plan    PT Assessment Patient does not need any further PT services  PT Problem List         PT Treatment Interventions      PT Goals (Current goals can be found in the Care Plan section)  Acute Rehab PT Goals Patient Stated Goal: stop the pain PT Goal Formulation: All assessment and education complete, DC therapy    Frequency       Co-evaluation               AM-PAC PT 6 Clicks Mobility  Outcome Measure Help needed turning from your back to your side while in a flat bed without using bedrails?: A Little Help needed moving from lying on your back to sitting on the side of a flat bed without using bedrails?: A Little Help needed moving to and from a bed to a chair (including a wheelchair)?: None Help needed standing up from a chair using your arms (e.g., wheelchair or bedside chair)?: None Help needed to walk in hospital room?: A Little Help needed climbing 3-5 steps with a railing? : A Little 6 Click Score: 20    End of Session Equipment Utilized During Treatment: Back brace Activity Tolerance: Patient tolerated treatment well Patient left: in bed;with call bell/phone within reach;with family/visitor present (sittign EOB) Nurse Communication: Mobility status PT Visit Diagnosis: Pain Pain - part of body:  (back)    Time: 9198-9174 PT Time Calculation (min) (ACUTE ONLY): 24 min   Charges:   PT Evaluation $PT Eval Low Complexity: 1 Low PT Treatments $Gait Training: 8-22 mins PT General Charges $$ ACUTE PT VISIT: 1  Visit         Norene Ames, PT, DPT Acute Rehabilitation Services Secure chat preferred Office #: [phone]   Norene CHRISTELLA Ames 12/21/2023, 8:54 AM

## 2023-12-21 NOTE — Evaluation (Signed)
 Occupational Therapy Evaluation Patient Details Name: Max Byrd MRN: [id number] DOB: Aug 30, 1955 Today's Date: 12/21/2023   History of Present Illness   Pt is a 68 y.o male admitted 11/3 for scheduled PLIF L4-L5. PMH: arthritis, CKD, HTN, bell's palsy, HLD     Clinical Impressions Pt admitted based on above, and was seen based on problem list below. PTA pt was independent with ADLs and IADLs. Today pt is supervision for most ADLs, but is min assist for LB ADLs with use of compensatory strategies. Provided and reviewed back educational handout with pt; pt able to verbalize and demo return understanding. Encouraged pt to perform LB ADLs seated, and have spouse assist prn. Recommending use of BSC at d/c to assist with LB bathing in shower. No follow up OT needs. All education complete, no further acute OT needs, OT is signing off on this pt.        If plan is discharge home, recommend the following:   A little help with walking and/or transfers;A little help with bathing/dressing/bathroom;Assistance with cooking/housework     Functional Status Assessment   Patient has had a recent decline in their functional status and demonstrates the ability to make significant improvements in function in a reasonable and predictable amount of time.     Equipment Recommendations   BSC/3in1      Precautions/Restrictions   Precautions Precautions: Fall;Back Precaution Booklet Issued: Yes (comment) Recall of Precautions/Restrictions: Intact Required Braces or Orthoses: Spinal Brace Spinal Brace: Lumbar corset Restrictions Weight Bearing Restrictions Per Provider Order: No     Mobility Bed Mobility   General bed mobility comments: Received sitting EOB    Transfers Overall transfer level: Needs assistance Equipment used: None Transfers: Sit to/from Stand Sit to Stand: Supervision           General transfer comment: S for safety with mobility in room      Balance Overall  balance assessment: Mild deficits observed, not formally tested, History of Falls     ADL either performed or assessed with clinical judgement   ADL Overall ADL's : Needs assistance/impaired Eating/Feeding: Set up;Sitting   Grooming: Supervision/safety;Standing           Upper Body Dressing : Set up;Sitting   Lower Body Dressing: Minimal assistance;Sit to/from stand Lower Body Dressing Details (indicate cue type and reason): Assist to thread legs Toilet Transfer: Supervision/safety;Ambulation;Regular Toilet;Grab bars   Toileting- Clothing Manipulation and Hygiene: Supervision/safety;Sit to/from stand;Sitting/lateral lean       Functional mobility during ADLs: Supervision/safety General ADL Comments: pt with decreased ROM limiting ability to complete LB ADLs     Vision Baseline Vision/History: 0 No visual deficits Patient Visual Report: No change from baseline Vision Assessment?: No apparent visual deficits            Pertinent Vitals/Pain Pain Assessment Pain Assessment: Faces Faces Pain Scale: Hurts even more Pain Location: back Pain Descriptors / Indicators: Discomfort, Grimacing Pain Intervention(s): Limited activity within patient's tolerance     Extremity/Trunk Assessment Upper Extremity Assessment Upper Extremity Assessment: Overall WFL for tasks assessed   Lower Extremity Assessment Lower Extremity Assessment: Defer to PT evaluation   Cervical / Trunk Assessment Cervical / Trunk Assessment: Back Surgery   Communication Communication Communication: No apparent difficulties   Cognition Arousal: Alert Behavior During Therapy: WFL for tasks assessed/performed Cognition: No apparent impairments   Following commands: Intact       Cueing  General Comments   Cueing Techniques: Verbal cues  Home Living Family/patient expects to be discharged to:: Private residence Living Arrangements: Spouse/significant other Available Help at  Discharge: Family;Available PRN/intermittently Type of Home: House Home Access: Stairs to enter [employer] of Steps: 3 Entrance Stairs-Rails: None Home Layout: One level     Bathroom Shower/Tub: Walk-in shower;Tub/shower unit   Bathroom Toilet: Standard     Home Equipment: None          Prior Functioning/Environment Prior Level of Function : Independent/Modified Independent;History of Falls (last six months)             Mobility Comments: No AD, reports has had at least 5 falls in last year ADLs Comments: Ind    OT Problem List: Decreased strength;Decreased range of motion;Decreased activity tolerance;Impaired balance (sitting and/or standing);Decreased knowledge of use of DME or AE;Pain        OT Goals(Current goals can be found in the care plan section)   Acute Rehab OT Goals Patient Stated Goal: To go home OT Goal Formulation: All assessment and education complete, DC therapy Time For Goal Achievement: 01/04/24 Potential to Achieve Goals: Good   AM-PAC OT 6 Clicks Daily Activity     Outcome Measure Help from another person eating meals?: None Help from another person taking care of personal grooming?: A Little Help from another person toileting, which includes using toliet, bedpan, or urinal?: A Little Help from another person bathing (including washing, rinsing, drying)?: A Little Help from another person to put on and taking off regular upper body clothing?: A Little Help from another person to put on and taking off regular lower body clothing?: A Little 6 Click Score: 19   End of Session Equipment Utilized During Treatment: Back brace Nurse Communication: Mobility status  Activity Tolerance: Patient tolerated treatment well Patient left: in bed;with call bell/phone within reach  OT Visit Diagnosis: Unsteadiness on feet (R26.81);Other abnormalities of gait and mobility (R26.89);Repeated falls (R29.6);Muscle weakness (generalized)  (M62.81);History of falling (Z91.81)                Time: 9270-9253 OT Time Calculation (min): 17 min Charges:  OT General Charges $OT Visit: 1 Visit OT Evaluation $OT Eval Low Complexity: 1 Low  Adrianne BROCKS, OT  Acute Rehabilitation Services Office [phone] Secure chat preferred   Adrianne GORMAN Savers 12/21/2023, 7:54 AM

## 2023-12-21 NOTE — Anesthesia Postprocedure Evaluation (Signed)
 Anesthesia Post Note  Patient: Max Byrd  Procedure(s) Performed: POSTERIOR LUMBAR INTERBODY FUSION LUMBAR FOUR-FIVE; POSTERIOR LATERAL AND INTERBODY FUSION (Back)     Patient location during evaluation: PACU Anesthesia Type: General Level of consciousness: awake Pain management: pain level controlled Vital Signs Assessment: post-procedure vital signs reviewed and stable Respiratory status: spontaneous breathing, nonlabored ventilation and respiratory function stable Cardiovascular status: blood pressure returned to baseline and stable Postop Assessment: no apparent nausea or vomiting Anesthetic complications: no   There were no known notable events for this encounter.  Last Vitals:  Vitals:   12/20/23 2328 12/21/23 0409  BP: (!) 129/95 (!) 161/99  Pulse: 88 85  Resp: 18 18  Temp: 36.9 C (!) 36.4 C  SpO2: 98% 100%    Last Pain:  Vitals:   12/21/23 0553  TempSrc:   PainSc: 9                  Sammuel Blick P Ivana Nicastro

## 2023-12-21 NOTE — Discharge Instructions (Signed)

## 2023-12-21 NOTE — Discharge Summary (Signed)
 Physician Discharge Summary  Patient ID: Max Byrd MRN: [id number] DOB/AGE: 1956-01-01 68 y.o.  Admit date: 12/20/2023 Discharge date: 12/21/2023  Admission Diagnoses:  Discharge Diagnoses:  Principal Problem:   Lumbar adjacent segment disease with spondylolisthesis   Discharged Condition: good  Hospital Course: Patient admitted to the hospital where he underwent uncomplicated L4-5 decompression and fusion.  Postoperatively doing very well.  Preoperative back and lower extremity pain much improved.  Standing ambulating and voiding without difficulty.  Ready for discharge home.  Consults:   Significant Diagnostic Studies:   Treatments:   Discharge Exam: Blood pressure (!) 137/91, pulse 92, temperature 98.4 F (36.9 C), temperature source Oral, resp. rate 17, height 5' 6 (1.676 m), weight 64.4 kg, SpO2 100%. Awake and alert.  Appropriate.  Motor and sensory function intact.  Wound clean and dry.  Chest and abdomen benign.  Disposition: Discharge disposition: 01-Home or Self Care        Allergies as of 12/21/2023       Reactions   Aspirin  Other (See Comments)   H/O BLEEDING ULCER   Nsaids Other (See Comments)   Stomach bleeding   Tolmetin Other (See Comments)   Stomach bleeding   Klonopin [clonazepam] Other (See Comments)   Erectile dysfunction   Buspirone Hcl    Other Reaction(s): intolerant   Clindamycin Hcl    Other Reaction(s): stomach upset   Penicillins Nausea And Vomiting, Other (See Comments)   Tolerates ancef    Reglan [metoclopramide] Anxiety   EXCITABILITY        Medication List     STOP taking these medications    oxyCODONE -acetaminophen  10-325 MG tablet Commonly known as: PERCOCET       TAKE these medications    ALPRAZolam  1 MG tablet Commonly known as: XANAX  Take 1 tablet (1 mg total) by mouth 4 (four) times daily.   amLODipine  5 MG tablet Commonly known as: NORVASC  Take 1 tablet (5 mg total) by mouth daily.   carisoprodol  350  MG tablet Commonly known as: SOMA  Take 175 mg by mouth 2 (two) times daily. What changed: when to take this   cholestyramine 4 g packet Commonly known as: QUESTRAN Take 1 packet by mouth daily with breakfast.   Clear Eyes Complete Soln Place 1 drop into both eyes as needed (dry eyes).   Diclofenac -miSOPROStol  50-0.2 MG Tbec Take 1 tablet by mouth daily.   DULoxetine  60 MG capsule Commonly known as: CYMBALTA  Take 2 capsules (120 mg total) by mouth daily.   Entresto 24-26 MG Generic drug: sacubitril-valsartan Take 1 tablet by mouth 2 (two) times daily.   furosemide  40 MG tablet Commonly known as: LASIX  Take 40 mg by mouth 2 (two) times daily as needed for fluid or edema.   hyoscyamine  0.125 MG SL tablet Commonly known as: LEVSIN  SL Take 0.25 mg by mouth every 6 (six) hours as needed (IBS).   isosorbide  mononitrate 30 MG 24 hr tablet Commonly known as: IMDUR  Take 30 mg by mouth every morning.   methocarbamol  500 MG tablet Commonly known as: ROBAXIN  Take 1 tablet (500 mg total) by mouth every 6 (six) hours as needed for muscle spasms.   morphine  15 MG 12 hr tablet Commonly known as: MS CONTIN  Take 15 mg by mouth 2 (two) times daily.   nitroGLYCERIN  0.4 MG SL tablet Commonly known as: NITROSTAT  Place 1 tablet (0.4 mg total) under the tongue every 5 (five) minutes as needed for chest pain (MAX 3 TABLETS).   Oxycodone  HCl 10  MG Tabs Take 1 tablet (10 mg total) by mouth every 3 (three) hours as needed for severe pain (pain score 7-10).   sertraline  100 MG tablet Commonly known as: ZOLOFT  Take 2 tablets (200 mg total) by mouth daily. What changed: when to take this   testosterone cypionate 200 MG/ML injection Commonly known as: DEPOTESTOSTERONE CYPIONATE What changed: See the new instructions.   zolpidem  10 MG tablet Commonly known as: AMBIEN  Take 1 tablet (10 mg total) by mouth at bedtime.               Durable Medical Equipment  (From admission,  onward)           Start     Ordered   12/20/23 1802  DME Walker rolling  Once       Question:  Patient needs a walker to treat with the following condition  Answer:  Degenerative spondylolisthesis   12/20/23 1801   12/20/23 1802  DME 3 n 1  Once        12/20/23 1801             Signed: Victory A Tashica Provencio 12/21/2023, 9:06 AM

## 2023-12-21 NOTE — Progress Notes (Signed)
 Patient ID: Max Byrd, male   DOB: 07/17/1955, 68 y.o.   MRN: [id number] Patient alert and oriented x 4. Surgical site clean dry and intact. AVS printed and given to patient. Discharge instructions explained. No further questions at this time. Bedside commode given.  [name], RN

## 2023-12-23 MED FILL — Sodium Chloride IV Soln 0.9%: INTRAVENOUS | Qty: 1000 | Status: AC

## 2023-12-23 MED FILL — Heparin Sodium (Porcine) Inj 1000 Unit/ML: INTRAMUSCULAR | Qty: 30 | Status: AC

## 2023-12-24 LAB — TYPE AND SCREEN
ABO/RH(D): A POS
Antibody Screen: POSITIVE
Donor AG Type: NEGATIVE
Donor AG Type: NEGATIVE
Unit division: 0
Unit division: 0

## 2023-12-24 LAB — BPAM RBC
Blood Product Expiration Date: 202511152359
Blood Product Expiration Date: 202511272359
Unit Type and Rh: 6200
Unit Type and Rh: 6200

## 2023-12-26 ENCOUNTER — Other Ambulatory Visit: Payer: Self-pay

## 2023-12-26 DIAGNOSIS — F411 Generalized anxiety disorder: Secondary | ICD-10-CM

## 2023-12-26 DIAGNOSIS — F410 Panic disorder [episodic paroxysmal anxiety] without agoraphobia: Secondary | ICD-10-CM

## 2023-12-27 MED ORDER — ALPRAZOLAM 1 MG PO TABS: 1.0000 mg | ORAL_TABLET | Freq: Four times a day (QID) | ORAL | 2 refills | Status: DC

## 2024-02-12 ENCOUNTER — Other Ambulatory Visit: Payer: Self-pay | Admitting: Adult Health

## 2024-02-12 DIAGNOSIS — G4700 Insomnia, unspecified: Secondary | ICD-10-CM

## 2024-02-28 ENCOUNTER — Telehealth: Payer: Self-pay | Admitting: Adult Health

## 2024-02-28 ENCOUNTER — Other Ambulatory Visit: Payer: Self-pay | Admitting: Adult Health

## 2024-02-28 ENCOUNTER — Other Ambulatory Visit: Payer: Self-pay

## 2024-02-28 DIAGNOSIS — G4700 Insomnia, unspecified: Secondary | ICD-10-CM

## 2024-02-28 MED ORDER — ZOLPIDEM TARTRATE 10 MG PO TABS: 10.0000 mg | ORAL_TABLET | Freq: Every day | ORAL | 2 refills | Status: DC

## 2024-02-28 NOTE — Telephone Encounter (Signed)
 Gina sent RF.

## 2024-02-28 NOTE — Telephone Encounter (Signed)
 Pt lvm that he needs a refill on his generic ambien . Pharmacy is [facility] in [place]

## 2024-03-02 ENCOUNTER — Other Ambulatory Visit: Payer: Self-pay | Admitting: Adult Health

## 2024-03-02 DIAGNOSIS — F411 Generalized anxiety disorder: Secondary | ICD-10-CM

## 2024-03-02 DIAGNOSIS — F331 Major depressive disorder, recurrent, moderate: Secondary | ICD-10-CM

## 2024-03-03 ENCOUNTER — Other Ambulatory Visit: Payer: Self-pay | Admitting: Adult Health

## 2024-03-03 DIAGNOSIS — F331 Major depressive disorder, recurrent, moderate: Secondary | ICD-10-CM

## 2024-03-03 DIAGNOSIS — F411 Generalized anxiety disorder: Secondary | ICD-10-CM

## 2024-03-07 ENCOUNTER — Other Ambulatory Visit: Payer: Self-pay | Admitting: Adult Health

## 2024-03-07 DIAGNOSIS — F411 Generalized anxiety disorder: Secondary | ICD-10-CM

## 2024-03-07 DIAGNOSIS — F410 Panic disorder [episodic paroxysmal anxiety] without agoraphobia: Secondary | ICD-10-CM

## 2024-03-13 ENCOUNTER — Encounter: Payer: Self-pay | Admitting: Adult Health

## 2024-03-13 ENCOUNTER — Telehealth: Admitting: Adult Health

## 2024-03-13 DIAGNOSIS — F411 Generalized anxiety disorder: Secondary | ICD-10-CM

## 2024-03-13 DIAGNOSIS — F329 Major depressive disorder, single episode, unspecified: Secondary | ICD-10-CM | POA: Diagnosis not present

## 2024-03-13 DIAGNOSIS — G4700 Insomnia, unspecified: Secondary | ICD-10-CM | POA: Diagnosis not present

## 2024-03-13 DIAGNOSIS — F410 Panic disorder [episodic paroxysmal anxiety] without agoraphobia: Secondary | ICD-10-CM

## 2024-03-13 DIAGNOSIS — F331 Major depressive disorder, recurrent, moderate: Secondary | ICD-10-CM

## 2024-03-13 MED ORDER — DULOXETINE HCL 60 MG PO CPEP: 120.0000 mg | ORAL_CAPSULE | Freq: Every day | ORAL | 1 refills | Status: AC

## 2024-03-13 MED ORDER — SERTRALINE HCL 100 MG PO TABS: 200.0000 mg | ORAL_TABLET | Freq: Every day | ORAL | 1 refills | Status: AC

## 2024-03-13 MED ORDER — ALPRAZOLAM 1 MG PO TABS: 1.0000 mg | ORAL_TABLET | Freq: Four times a day (QID) | ORAL | 2 refills | Status: AC

## 2024-03-13 MED ORDER — ZOLPIDEM TARTRATE 10 MG PO TABS: 10.0000 mg | ORAL_TABLET | Freq: Every day | ORAL | 2 refills | Status: AC

## 2024-03-13 NOTE — Progress Notes (Signed)
 Max Byrd [id number] 1955/05/17 69 y.o.  Virtual Visit via Video Note  I connected with pt @ on 03/13/24 at  8:00 AM EST by a video enabled telemedicine application and verified that I am speaking with the correct person using two identifiers.   I discussed the limitations of evaluation and management by telemedicine and the availability of in person appointments. The patient expressed understanding and agreed to proceed.  I discussed the assessment and treatment plan with the patient. The patient was provided an opportunity to ask questions and all were answered. The patient agreed with the plan and demonstrated an understanding of the instructions.   The patient was advised to call back or seek an in-person evaluation if the symptoms worsen or if the condition fails to improve as anticipated.  I provided 25 minutes of non-face-to-face time during this encounter.  The patient was located at home.  The provider was located at [facility].   Max LOISE Sayers, NP   Subjective:   Patient ID:  Max Byrd is a 69 y.o. (DOB [birth year]) male.  Chief Complaint: No chief complaint on file.   HPI Max Byrd presents for follow-up of GAD, MDD, insomnia, and panic attacks.   Describes mood today as ok. Pleasant. Tearful at times. Mood symptoms - reports depression, anxiety and irritability - things are more on the positive side. Reports lower interest and motivation. Denies panic attacks. Reports some worry, rumination and over thinking. Reports chronic pain issues. Recovering from back surgery. Reports his health continues to decline with multiple medical issues. Reports mood is variable. Stating things are difficult - everything feels like a struggle. Feels like medications are helpful. Taking medications as prescribed. Working with a veterinary surgeon - 3 visits. Energy levels lower. Active, does not have a regular exercise routine with physical limitations. Enjoys some usual  interests. Married. Lives with wife.  Appetite adequate. Weight loss - 135 pounds.  Sleeps well most nights with Ambien . Averages 8 to 10 hours of broken sleep.  Focus and concentration difficulties. Completing tasks. Managing some aspects of household. Disabled - health issues.  Denies SI or HI.  Denies AH or VH. Denies substance use. Denies self harm.  Review of Systems:  Review of Systems  Musculoskeletal:  Negative for gait problem.  Neurological:  Negative for tremors.  Psychiatric/Behavioral:         Please refer to HPI    Medications: I have reviewed the patient's current medications.  Current Outpatient Medications  Medication Sig Dispense Refill   ALPRAZolam  (XANAX ) 1 MG tablet Take 1 tablet (1 mg total) by mouth 4 (four) times daily. 120 tablet 2   amLODipine  (NORVASC ) 5 MG tablet Take 1 tablet (5 mg total) by mouth daily. 90 tablet 3   carisoprodol  (SOMA ) 350 MG tablet Take 175 mg by mouth 2 (two) times daily. (Patient taking differently: Take 175 mg by mouth daily.)     cholestyramine  (QUESTRAN ) 4 g packet Take 1 packet by mouth daily with breakfast.     Diclofenac -miSOPROStol  50-0.2 MG TBEC Take 1 tablet by mouth daily.     DULoxetine  (CYMBALTA ) 60 MG capsule Take 2 capsules (120 mg total) by mouth daily. 180 capsule 0   ENTRESTO  24-26 MG Take 1 tablet by mouth 2 (two) times daily.     furosemide  (LASIX ) 40 MG tablet Take 40 mg by mouth 2 (two) times daily as needed for fluid or edema.     hyoscyamine  (LEVSIN  SL) 0.125 MG SL tablet  Take 0.25 mg by mouth every 6 (six) hours as needed (IBS).      Hyprom-Naphaz-Polysorb-Zn Sulf (CLEAR EYES COMPLETE) SOLN Place 1 drop into both eyes as needed (dry eyes).     isosorbide  mononitrate (IMDUR ) 30 MG 24 hr tablet Take 30 mg by mouth every morning.     methocarbamol  (ROBAXIN ) 500 MG tablet Take 1 tablet (500 mg total) by mouth every 6 (six) hours as needed for muscle spasms. 40 tablet 1   morphine  (MS CONTIN ) 15 MG 12 hr tablet  Take 15 mg by mouth 2 (two) times daily.     nitroGLYCERIN  (NITROSTAT ) 0.4 MG SL tablet Place 1 tablet (0.4 mg total) under the tongue every 5 (five) minutes as needed for chest pain (MAX 3 TABLETS). 25 tablet 11   oxyCODONE  10 MG TABS Take 1 tablet (10 mg total) by mouth every 3 (three) hours as needed for severe pain (pain score 7-10). 50 tablet 0   sertraline  (ZOLOFT ) 100 MG tablet Take 2 tablets (200 mg total) by mouth at bedtime. 60 tablet 2   testosterone cypionate (DEPOTESTOSTERONE CYPIONATE) 200 MG/ML injection  (Patient taking differently: Inject 1 mL into the muscle every 14 (fourteen) days.)  3   zolpidem  (AMBIEN ) 10 MG tablet Take 1 tablet (10 mg total) by mouth at bedtime. 30 tablet 2   No current facility-administered medications for this visit.    Medication Side Effects: None  Allergies: Allergies[1]  Past Medical History:  Diagnosis Date   Anginal pain 2015   since valve was done   Anxiety    Arthritis    left pointer finger; forminal openings bilaterally (08/10/2013)   Bicuspid aortic valve    a. 01/2013 s/p AVR - 21mm Edwards 3300 TFX pericardial tissue valve, ser # [id number].   CKD (chronic kidney disease) stage 2, GFR 60-89 ml/min    Coronary atherosclerosis of native coronary artery    a. 01/2013 CABGx3: LIMA->LAD, VG->Diag, VG->OM (performed @ time of AVR),  occluded SVG-OM2 , now with 95% stenosis at the anastomosis site on OM 2 - PCI attempt 07/28/13 unsuccessful - med Rx cont'd    Depression    Erectile dysfunction    INJECTIONS OF PROSTAGLANDIN BY UROLGIST   Essential hypertension, benign    GERD (gastroesophageal reflux disease)    H/O Bell's palsy 2010 X2   Heart murmur    History of blood transfusion    related to stomach bleeding from ASA & NSAIDS   History of stomach ulcers    Hyperlipidemia    IBS (irritable bowel syndrome)    Migraine    went away in the 1990's   Pain, chronic postoperative    S/P LEFT ARM SURGERY   Renal cell carcinoma  of right kidney (HCC) 2003    Family History  Problem Relation Age of Onset   Cancer Mother        BREAST/OVARIAN   Heart disease Father        S/P MI/CABG    Social History   Socioeconomic History   Marital status: Married    Spouse name: Not on file   Number of children: Not on file   Years of education: Not on file   Highest education level: Not on file  Occupational History   Not on file  Tobacco Use   Smoking status: Former    Current packs/day: 0.00    Average packs/day: 1.5 packs/day for 6.3 years (9.4 ttl pk-yrs)    Types: Cigarettes  Start date: 10/06/1969    Quit date: 01/21/1976    Years since quitting: 48.1    Passive exposure: Never   Smokeless tobacco: Current    Types: Snuff   Tobacco comments:    quit  36 years ago  Vaping Use   Vaping status: Never Used  Substance and Sexual Activity   Alcohol  use: Not Currently    Comment: last beer was 01/2019   Drug use: No   Sexual activity: Yes  Other Topics Concern   Not on file  Social History Narrative   Was working out regularly prior to CABG/AVR.  Hunts/fishes.   Social Drivers of Health   Tobacco Use: High Risk (12/15/2023)   Patient History    Smoking Tobacco Use: Former    Smokeless Tobacco Use: Current    Passive Exposure: Never  Programmer, Applications: Not on [employer] Insecurity: Not on file  Transportation Needs: Not on file  Physical Activity: Not on file  Stress: Not on file  Social Connections: Not on file  Intimate Partner Violence: Not on file  Depression (EYV7-0): Not on file  Alcohol  Screen: Not on file  Housing: Not on file  Utilities: Not on file  Health Literacy: Not on file    Past Medical History, Surgical history, Social history, and Family history were reviewed and updated as appropriate.   Please see review of systems for further details on the patient's review from today.   Objective:   Physical Exam:  There were no vitals taken for this  visit.  Physical Exam Constitutional:      General: He is not in acute distress. Musculoskeletal:        General: No deformity.  Neurological:     Mental Status: He is alert and oriented to person, place, and time.     Coordination: Coordination normal.  Psychiatric:        Attention and Perception: Attention and perception normal. He does not perceive auditory or visual hallucinations.        Mood and Affect: Mood normal. Mood is not anxious or depressed. Affect is not labile, blunt, angry or inappropriate.        Speech: Speech normal.        Behavior: Behavior normal.        Thought Content: Thought content normal. Thought content is not paranoid or delusional. Thought content does not include homicidal or suicidal ideation. Thought content does not include homicidal or suicidal plan.        Cognition and Memory: Cognition and memory normal.        Judgment: Judgment normal.     Comments: Insight intact     Lab Review:     Component Value Date/Time   NA 138 12/20/2023 1158   NA 139 03/08/2014 1230   K 3.4 (L) 12/20/2023 1158   K 4.2 03/08/2014 1230   CL 101 12/20/2023 1158   CL 101 03/08/2014 1230   CO2 28 12/15/2023 1000   CO2 30 03/08/2014 1230   GLUCOSE 90 12/20/2023 1158   GLUCOSE 87 03/08/2014 1230   BUN 13 12/20/2023 1158   BUN 27 (H) 03/08/2014 1230   CREATININE 0.90 12/20/2023 1158   CREATININE 1.4 (H) 03/08/2014 1230   CALCIUM 8.7 (L) 12/15/2023 1000   CALCIUM 8.8 03/08/2014 1230   PROT 5.9 (L) 03/08/2014 1230   ALBUMIN  3.6 03/08/2014 1230   AST 24 03/08/2014 1230   ALT 23 03/08/2014 1230   ALKPHOS 51 03/08/2014 1230  BILITOT 1.00 03/08/2014 1230   GFRNONAA >60 12/15/2023 1000   GFRAA >60 09/01/2017 1018       Component Value Date/Time   WBC 7.3 09/01/2017 1018   RBC 4.87 09/01/2017 1018   HGB 13.3 12/20/2023 1158   HGB 13.7 07/03/2015 0818   HCT 39.0 12/20/2023 1158   HCT 38.3 (L) 07/03/2015 0818   PLT 129 (L) 09/01/2017 1018   PLT 146  07/03/2015 0818   MCV 95.3 09/01/2017 1018   MCV 88 07/03/2015 0818   MCH 33.7 09/01/2017 1018   MCHC 35.3 09/01/2017 1018   RDW 11.8 09/01/2017 1018   RDW 11.9 07/03/2015 0818   LYMPHSABS 1.0 09/01/2017 1018   LYMPHSABS 1.0 07/03/2015 0818   MONOABS 0.4 09/01/2017 1018   EOSABS 0.2 09/01/2017 1018   EOSABS 0.1 07/03/2015 0818   BASOSABS 0.0 09/01/2017 1018   BASOSABS 0.0 07/03/2015 0818    No results found for: POCLITH, LITHIUM   No results found for: PHENYTOIN, PHENOBARB, VALPROATE, CBMZ   .res Assessment: Plan:    Plan:  PDMP reviewed  1. Ambien  10mg  at hs 2. Xanax  1mg  4 x daily 3. Zoloft  100mg  2 daily 4. Cymbalta  60mg  BID   RTC 6 months   25 minutes spent dedicated to the care of this patient on the date of this encounter to include pre-visit review of records, ordering of medication, post visit documentation, and face-to-face time with the patient discussing GAD, MDD, insomnia, and panic attacks.   Patient advised to contact office with any questions, adverse effects, or acute worsening in signs and symptoms.  Discussed potential benefits, risk, and side effects of benzodiazepines to include potential risk of tolerance and dependence, as well as possible drowsiness. Advised patient not to drive if experiencing drowsiness and to take lowest possible effective dose to minimize risk of dependence and tolerance.  There are no diagnoses linked to this encounter.   Please see After Visit Summary for patient specific instructions.  Future Appointments  Date Time Provider Department Center  03/13/2024  8:00 AM Mylan Lengyel Nattalie, NP [facility] None    No orders of the defined types were placed in this encounter.     -------------------------------     [1]  Allergies Allergen Reactions   Aspirin  Other (See Comments)    H/O BLEEDING ULCER   Nsaids Other (See Comments)    Stomach bleeding   Tolmetin Other (See Comments)    Stomach bleeding    Klonopin [Clonazepam] Other (See Comments)    Erectile dysfunction   Buspirone Hcl     Other Reaction(s): intolerant   Clindamycin Hcl     Other Reaction(s): stomach upset   Penicillins Nausea And Vomiting and Other (See Comments)    Tolerates ancef    Reglan [Metoclopramide] Anxiety    EXCITABILITY

## 2024-08-28 ENCOUNTER — Ambulatory Visit: Admitting: Adult Health

## 2024-09-11 ENCOUNTER — Telehealth: Admitting: Adult Health
